# Patient Record
Sex: Male | Born: 1950
Health system: Southern US, Community
[De-identification: ages and names within clinical notes are randomized; demographics above are authoritative.]

## PROBLEM LIST (undated history)

## (undated) DIAGNOSIS — I10 Essential (primary) hypertension: Secondary | ICD-10-CM

## (undated) DIAGNOSIS — E7800 Pure hypercholesterolemia, unspecified: Secondary | ICD-10-CM

## (undated) DIAGNOSIS — I639 Cerebral infarction, unspecified: Secondary | ICD-10-CM

## (undated) HISTORY — PX: APPENDECTOMY: SHX54

---

## 1998-03-18 ENCOUNTER — Ambulatory Visit (HOSPITAL_COMMUNITY): Admission: RE | Admit: 1998-03-18 | Discharge: 1998-03-18 | Payer: Self-pay | Admitting: Family Medicine

## 2009-12-09 ENCOUNTER — Ambulatory Visit: Payer: Self-pay | Admitting: Physician Assistant

## 2009-12-09 DIAGNOSIS — I10 Essential (primary) hypertension: Secondary | ICD-10-CM

## 2009-12-12 ENCOUNTER — Encounter: Payer: Self-pay | Admitting: Physician Assistant

## 2009-12-13 ENCOUNTER — Encounter (INDEPENDENT_AMBULATORY_CARE_PROVIDER_SITE_OTHER): Payer: Self-pay | Admitting: *Deleted

## 2009-12-23 ENCOUNTER — Ambulatory Visit: Payer: Self-pay | Admitting: Physician Assistant

## 2009-12-23 DIAGNOSIS — F1010 Alcohol abuse, uncomplicated: Secondary | ICD-10-CM | POA: Insufficient documentation

## 2009-12-23 DIAGNOSIS — N529 Male erectile dysfunction, unspecified: Secondary | ICD-10-CM

## 2009-12-23 DIAGNOSIS — F17200 Nicotine dependence, unspecified, uncomplicated: Secondary | ICD-10-CM | POA: Insufficient documentation

## 2009-12-23 DIAGNOSIS — E785 Hyperlipidemia, unspecified: Secondary | ICD-10-CM

## 2009-12-26 ENCOUNTER — Encounter: Payer: Self-pay | Admitting: Physician Assistant

## 2010-01-03 ENCOUNTER — Inpatient Hospital Stay (HOSPITAL_COMMUNITY): Admission: EM | Admit: 2010-01-03 | Discharge: 2010-01-10 | Payer: Self-pay | Admitting: Emergency Medicine

## 2010-01-03 ENCOUNTER — Ambulatory Visit: Payer: Self-pay | Admitting: Cardiovascular Disease

## 2010-01-03 ENCOUNTER — Emergency Department (HOSPITAL_COMMUNITY): Admission: EM | Admit: 2010-01-03 | Discharge: 2010-01-03 | Payer: Self-pay | Admitting: Family Medicine

## 2010-01-04 ENCOUNTER — Encounter (INDEPENDENT_AMBULATORY_CARE_PROVIDER_SITE_OTHER): Payer: Self-pay | Admitting: Internal Medicine

## 2010-01-06 ENCOUNTER — Ambulatory Visit: Payer: Self-pay | Admitting: Physical Medicine & Rehabilitation

## 2010-01-10 ENCOUNTER — Inpatient Hospital Stay (HOSPITAL_COMMUNITY)
Admission: RE | Admit: 2010-01-10 | Discharge: 2010-01-16 | Payer: Self-pay | Admitting: Physical Medicine & Rehabilitation

## 2010-01-10 ENCOUNTER — Encounter: Payer: Self-pay | Admitting: Physician Assistant

## 2010-01-10 ENCOUNTER — Ambulatory Visit: Payer: Self-pay | Admitting: Physical Medicine & Rehabilitation

## 2010-01-23 ENCOUNTER — Encounter: Payer: Self-pay | Admitting: Physician Assistant

## 2010-01-31 ENCOUNTER — Ambulatory Visit: Payer: Self-pay | Admitting: Physician Assistant

## 2010-01-31 DIAGNOSIS — I6350 Cerebral infarction due to unspecified occlusion or stenosis of unspecified cerebral artery: Secondary | ICD-10-CM | POA: Insufficient documentation

## 2010-02-14 ENCOUNTER — Telehealth: Payer: Self-pay | Admitting: Physician Assistant

## 2010-02-22 ENCOUNTER — Encounter
Admission: RE | Admit: 2010-02-22 | Discharge: 2010-02-24 | Payer: Self-pay | Admitting: Physical Medicine & Rehabilitation

## 2010-02-24 ENCOUNTER — Ambulatory Visit: Payer: Self-pay | Admitting: Physical Medicine & Rehabilitation

## 2010-02-27 ENCOUNTER — Telehealth: Payer: Self-pay | Admitting: Physician Assistant

## 2010-03-01 ENCOUNTER — Telehealth: Payer: Self-pay | Admitting: Physician Assistant

## 2010-04-12 ENCOUNTER — Ambulatory Visit: Payer: Self-pay | Admitting: Physician Assistant

## 2010-04-12 ENCOUNTER — Telehealth: Payer: Self-pay | Admitting: Physician Assistant

## 2010-04-12 DIAGNOSIS — R269 Unspecified abnormalities of gait and mobility: Secondary | ICD-10-CM | POA: Insufficient documentation

## 2010-04-12 DIAGNOSIS — R5381 Other malaise: Secondary | ICD-10-CM

## 2010-04-12 DIAGNOSIS — R5383 Other fatigue: Secondary | ICD-10-CM

## 2010-04-12 LAB — CONVERTED CEMR LAB
Bilirubin Urine: NEGATIVE
Glucose, Urine, Semiquant: NEGATIVE
Specific Gravity, Urine: <1.005
pH: 5

## 2010-04-13 ENCOUNTER — Encounter: Payer: Self-pay | Admitting: Physician Assistant

## 2010-04-13 LAB — CONVERTED CEMR LAB
ALT: 15 units/L (ref 0–53)
Albumin: 4.4 g/dL (ref 3.5–5.2)
Alkaline Phosphatase: 74 units/L (ref 39–117)
Basophils Absolute: 0.1 10*3/uL (ref 0.0–0.1)
CO2: 23 meq/L (ref 19–32)
Glucose, Bld: 160 mg/dL — ABNORMAL HIGH (ref 70–99)
Hemoglobin: 14.6 g/dL (ref 13.0–17.0)
Lymphocytes Relative: 32 % (ref 12–46)
Monocytes Absolute: 0.6 10*3/uL (ref 0.1–1.0)
Monocytes Relative: 7 % (ref 3–12)
Neutro Abs: 5 10*3/uL (ref 1.7–7.7)
Potassium: 4 meq/L (ref 3.5–5.3)
RBC: 4.46 M/uL (ref 4.22–5.81)
Sodium: 136 meq/L (ref 135–145)
Total Protein: 8.3 g/dL (ref 6.0–8.3)

## 2010-04-18 ENCOUNTER — Ambulatory Visit: Payer: Self-pay | Admitting: Physician Assistant

## 2010-04-18 LAB — CONVERTED CEMR LAB: Blood Glucose, AC Bkfst: 101 mg/dL

## 2010-04-20 DIAGNOSIS — E739 Lactose intolerance, unspecified: Secondary | ICD-10-CM

## 2010-04-25 ENCOUNTER — Telehealth: Payer: Self-pay | Admitting: Physician Assistant

## 2010-04-25 ENCOUNTER — Encounter: Admission: RE | Admit: 2010-04-25 | Discharge: 2010-05-31 | Payer: Self-pay | Admitting: Physician Assistant

## 2010-04-25 ENCOUNTER — Ambulatory Visit: Payer: Self-pay | Admitting: Physician Assistant

## 2010-05-08 ENCOUNTER — Encounter: Payer: Self-pay | Admitting: Physician Assistant

## 2010-05-08 ENCOUNTER — Telehealth: Payer: Self-pay | Admitting: Physician Assistant

## 2010-05-22 ENCOUNTER — Encounter: Payer: Self-pay | Admitting: Physician Assistant

## 2010-05-31 ENCOUNTER — Encounter: Payer: Self-pay | Admitting: Physician Assistant

## 2010-05-31 ENCOUNTER — Encounter: Admission: RE | Admit: 2010-05-31 | Discharge: 2010-06-08 | Payer: Self-pay | Admitting: Physician Assistant

## 2010-06-12 ENCOUNTER — Telehealth: Payer: Self-pay | Admitting: Physician Assistant

## 2010-07-14 ENCOUNTER — Ambulatory Visit: Payer: Self-pay | Admitting: Physician Assistant

## 2010-07-14 DIAGNOSIS — G4700 Insomnia, unspecified: Secondary | ICD-10-CM

## 2010-07-14 DIAGNOSIS — L738 Other specified follicular disorders: Secondary | ICD-10-CM | POA: Insufficient documentation

## 2010-07-14 DIAGNOSIS — M25519 Pain in unspecified shoulder: Secondary | ICD-10-CM | POA: Insufficient documentation

## 2010-07-14 LAB — CONVERTED CEMR LAB
Albumin: 4.7 g/dL (ref 3.5–5.2)
CO2: 22 meq/L (ref 19–32)
Calcium: 9.6 mg/dL (ref 8.4–10.5)
Chloride: 103 meq/L (ref 96–112)
Cholesterol: 183 mg/dL (ref 0–200)
Glucose, Bld: 88 mg/dL (ref 70–99)
Potassium: 4.1 meq/L (ref 3.5–5.3)
Sodium: 138 meq/L (ref 135–145)
Total Bilirubin: 0.4 mg/dL (ref 0.3–1.2)
Total Protein: 7.9 g/dL (ref 6.0–8.3)
Triglycerides: 331 mg/dL — ABNORMAL HIGH (ref <150)

## 2010-07-19 ENCOUNTER — Ambulatory Visit: Payer: Self-pay | Admitting: Sports Medicine

## 2010-07-19 ENCOUNTER — Encounter (INDEPENDENT_AMBULATORY_CARE_PROVIDER_SITE_OTHER): Payer: Self-pay | Admitting: *Deleted

## 2010-07-19 DIAGNOSIS — M7500 Adhesive capsulitis of unspecified shoulder: Secondary | ICD-10-CM

## 2010-07-20 ENCOUNTER — Ambulatory Visit: Payer: Self-pay | Admitting: Physician Assistant

## 2010-07-23 ENCOUNTER — Telehealth: Payer: Self-pay | Admitting: Physician Assistant

## 2010-07-25 ENCOUNTER — Encounter: Payer: Self-pay | Admitting: Physician Assistant

## 2010-07-27 ENCOUNTER — Telehealth: Payer: Self-pay | Admitting: Physician Assistant

## 2010-07-30 ENCOUNTER — Emergency Department (HOSPITAL_COMMUNITY): Admission: EM | Admit: 2010-07-30 | Discharge: 2010-07-30 | Payer: Self-pay | Admitting: Emergency Medicine

## 2010-07-31 ENCOUNTER — Ambulatory Visit (HOSPITAL_COMMUNITY): Admission: RE | Admit: 2010-07-31 | Discharge: 2010-07-31 | Payer: Self-pay | Admitting: Gastroenterology

## 2010-08-04 DIAGNOSIS — K921 Melena: Secondary | ICD-10-CM

## 2010-08-08 ENCOUNTER — Ambulatory Visit: Payer: Self-pay | Admitting: Physician Assistant

## 2010-08-17 ENCOUNTER — Telehealth: Payer: Self-pay | Admitting: Physician Assistant

## 2010-08-23 ENCOUNTER — Emergency Department (HOSPITAL_COMMUNITY): Admission: EM | Admit: 2010-08-23 | Discharge: 2010-08-23 | Payer: Self-pay | Admitting: Emergency Medicine

## 2010-09-15 ENCOUNTER — Ambulatory Visit: Payer: Self-pay | Admitting: Physician Assistant

## 2010-09-16 ENCOUNTER — Encounter (INDEPENDENT_AMBULATORY_CARE_PROVIDER_SITE_OTHER): Payer: Self-pay | Admitting: Internal Medicine

## 2010-10-12 ENCOUNTER — Emergency Department (HOSPITAL_COMMUNITY)
Admission: EM | Admit: 2010-10-12 | Discharge: 2010-10-12 | Payer: Self-pay | Source: Home / Self Care | Admitting: Emergency Medicine

## 2010-10-18 ENCOUNTER — Ambulatory Visit: Payer: Self-pay | Admitting: Physician Assistant

## 2010-10-18 ENCOUNTER — Encounter (INDEPENDENT_AMBULATORY_CARE_PROVIDER_SITE_OTHER): Payer: Self-pay | Admitting: Nurse Practitioner

## 2010-10-20 ENCOUNTER — Encounter (INDEPENDENT_AMBULATORY_CARE_PROVIDER_SITE_OTHER): Payer: Self-pay | Admitting: Nurse Practitioner

## 2010-10-20 LAB — CONVERTED CEMR LAB
Albumin: 5.1 g/dL (ref 3.5–5.2)
HDL: 37 mg/dL — ABNORMAL LOW (ref >39)
LDL Cholesterol: 90 mg/dL (ref 0–99)
Total Bilirubin: 0.4 mg/dL (ref 0.3–1.2)
Total CHOL/HDL Ratio: 4.8
Total Protein: 8.5 g/dL — ABNORMAL HIGH (ref 6.0–8.3)
Triglycerides: 259 mg/dL — ABNORMAL HIGH (ref <150)
VLDL: 52 mg/dL — ABNORMAL HIGH (ref 0–40)

## 2010-10-30 ENCOUNTER — Ambulatory Visit: Payer: Self-pay | Admitting: Nurse Practitioner

## 2010-10-30 LAB — CONVERTED CEMR LAB
Cholesterol, target level: 200 mg/dL
HDL goal, serum: 40 mg/dL

## 2010-12-05 ENCOUNTER — Ambulatory Visit
Admission: RE | Admit: 2010-12-05 | Discharge: 2010-12-05 | Payer: Self-pay | Source: Home / Self Care | Attending: Surgery | Admitting: Surgery

## 2010-12-05 ENCOUNTER — Encounter (INDEPENDENT_AMBULATORY_CARE_PROVIDER_SITE_OTHER): Payer: Self-pay | Admitting: Nurse Practitioner

## 2010-12-05 LAB — CONVERTED CEMR LAB: Cholesterol: 133 mg/dL (ref 0–200)

## 2010-12-07 ENCOUNTER — Encounter (INDEPENDENT_AMBULATORY_CARE_PROVIDER_SITE_OTHER): Payer: Self-pay | Admitting: Nurse Practitioner

## 2010-12-31 LAB — CONVERTED CEMR LAB
AST: 19 units/L (ref 0–37)
Albumin: 4.9 g/dL (ref 3.5–5.2)
Alkaline Phosphatase: 70 units/L (ref 39–117)
Basophils Absolute: 0 10*3/uL (ref 0.0–0.1)
Benzodiazepines.: NEGATIVE
Chloride: 103 meq/L (ref 96–112)
Glucose, Bld: 90 mg/dL (ref 70–99)
Hemoglobin: 16 g/dL (ref 13.0–17.0)
LDL Cholesterol: 124 mg/dL — ABNORMAL HIGH (ref 0–99)
Lymphocytes Relative: 33 % (ref 12–46)
Lymphs Abs: 2.5 10*3/uL (ref 0.7–4.0)
Methadone: NEGATIVE
Monocytes Absolute: 0.6 10*3/uL (ref 0.1–1.0)
Neutro Abs: 4.2 10*3/uL (ref 1.7–7.7)
Potassium: 4.4 meq/L (ref 3.5–5.3)
Propoxyphene: NEGATIVE
RBC: 4.7 M/uL (ref 4.22–5.81)
RDW: 14.3 % (ref 11.5–15.5)
Sodium: 141 meq/L (ref 135–145)
TSH: 2.393 microintl units/mL (ref 0.350–4.500)
Total Protein: 8.4 g/dL — ABNORMAL HIGH (ref 6.0–8.3)
Triglycerides: 301 mg/dL — ABNORMAL HIGH (ref <150)
WBC: 7.5 10*3/uL (ref 4.0–10.5)

## 2011-01-02 NOTE — Letter (Signed)
Summary: *HSN Results Follow up  [facility]  [address] [place], [place] [address]   Phone: [phone]  Fax: [phone]      12/13/2009   LARY ECKARDT [address] [place], [place]  [address]   Dear  [name],                            ____S.Drinkard,FNP   ____D. Gore,FNP       ____B. McPherson,MD   ____V. Rankins,MD    ____E. Mulberry,MD    ____N. Daphine Deutscher, FNP  ____D. Reche Dixon, MD    ____K. Philipp Deputy, MD    ____Other     This letter is to inform you that your recent test(s):  _______Pap Smear    ___X____Lab Test     _______X-ray    _______ is within acceptable limits  ___X____ requires a medication change  _______ requires a follow-up lab visit  _______ requires a follow-up visit with your provider   Comments: We have been trying to reach you concerning changing your medication.  Please give the office a call at your earliest convenience.       _________________________________________________________ If you have any questions, please contact our office                     Sincerely,  [place] Shannon [facility]

## 2011-01-02 NOTE — Assessment & Plan Note (Signed)
Summary: 2 week f/u and test results /tmm   Vital Signs:  Patient profile:   60 year old male Height:      69 inches Weight:      180 pounds BMI:     26.68 Temp:     98.2 degrees F oral Pulse rate:   88 / minute Pulse rhythm:   regular Resp:     18 per minute BP sitting:   168 / 89  (left arm) Cuff size:   regular  Vitals Entered By: [place] Shannon (December 23, 2009 11:03 AM) CC: two week f/u...Marland KitchenMarland Kitchen pt says he thinks that the bp med still not working... pt is here to talk about his cholestrol..., Hypertension Management Is Patient Diabetic? No Pain Assessment Patient in pain? no       Does patient need assistance? Functional Status Self care Ambulation Normal   CC:  two week f/u...Marland KitchenMarland Kitchen pt says he thinks that the bp med still not working... pt is here to talk about his cholestrol... and Hypertension Management.  History of Present Illness: Here for f/u. No GI appt yet.  No more bleeding. Taking BP meds. Still drinking about 2-3 airplane bottles of liquor a day.  Has gone through AA in past.   Hypertension History:      He denies headache, chest pain, palpitations, dyspnea with exertion, and syncope.  He notes no problems with any antihypertensive medication side effects.        Positive major cardiovascular risk factors include male age 78 years old or older, hyperlipidemia, hypertension, and current tobacco user.     Habits & Providers  Exercise-Depression-Behavior     Have you felt down or hopeless? no     Have you felt little pleasure in things? no  Current Medications (verified): 1)  Amlodipine Besylate 5 Mg Tabs (Amlodipine Besylate) .... Take 1 Tablet By Mouth Once A Day  Allergies (verified): No Known Drug Allergies  Physical Exam  General:  alert, well-developed, and well-nourished.   Head:  normocephalic and atraumatic.   Neck:  supple.   Lungs:  normal breath sounds, no crackles, and no wheezes.   Heart:  normal rate and regular rhythm.   Neurologic:   alert & oriented X3 and cranial nerves II-XII intact.   Psych:  normally interactive.     Impression & Recommendations:  Problem # 1:  ESSENTIAL HYPERTENSION, BENIGN (ICD-401.1) add lisinopril hct and recheck bmet in 2 weeks  His updated medication list for this problem includes:    Amlodipine Besylate 5 Mg Tabs (Amlodipine besylate) .Marland Kitchen... Take 1 tablet by mouth once a day    Lisinopril-hydrochlorothiazide 20-12.5 Mg Tabs (Lisinopril-hydrochlorothiazide) .Marland Kitchen... Take 1 tablet by mouth once a day for blood pressure  Problem # 2:  HYPERLIPIDEMIA (ICD-272.4) rec he start on meds check L/L in 8-12 weeks  His updated medication list for this problem includes:    Pravastatin Sodium 20 Mg Tabs (Pravastatin sodium) .Marland Kitchen... Take 1 tab by mouth at bedtime for cholesterol  Problem # 3:  BLOOD IN STOOL (ICD-578.1) GI referral pending  Problem # 4:  ALCOHOL ABUSE (ICD-305.00) recommend he see LCSW or sub abuse counselor patient declines will continue to try to encourage him  Problem # 5:  TOBACCO ABUSE (ICD-305.1)  patient wants to try chantix warned of side effects and need to d/c if they occur  His updated medication list for this problem includes:    Chantix Starting Month Pak 0.5 Mg X 11 & 1 Mg X  42 Tabs (Varenicline tartrate) .Marland Kitchen... As directed    Chantix Continuing Month Pak 1 Mg Tabs (Varenicline tartrate) .Marland Kitchen... As directed  Problem # 6:  ERECTILE DYSFUNCTION, ORGANIC (ICD-607.84) patient reports problems with keeping erections will address BP first if continues to be a problem, can discuss viagra  Complete Medication List: 1)  Amlodipine Besylate 5 Mg Tabs (Amlodipine besylate) .... Take 1 tablet by mouth once a day 2)  Lisinopril-hydrochlorothiazide 20-12.5 Mg Tabs (Lisinopril-hydrochlorothiazide) .... Take 1 tablet by mouth once a day for blood pressure 3)  Pravastatin Sodium 20 Mg Tabs (Pravastatin sodium) .... Take 1 tab by mouth at bedtime for cholesterol 4)  Chantix  Starting Month Pak 0.5 Mg X 11 & 1 Mg X 42 Tabs (Varenicline tartrate) .... As directed 5)  Chantix Continuing Month Pak 1 Mg Tabs (Varenicline tartrate) .... As directed  Hypertension Assessment/Plan:      The patient's hypertensive risk group is category B: At least one risk factor (excluding diabetes) with no target organ damage.  His calculated 10 year risk of coronary heart disease is 27 %.  Today's blood pressure is 168/89.  His blood pressure goal is < 140/90.  Patient Instructions: 1)  Tobacco is very bad for your health and your loved ones ! You should stop smoking !  2)  Stop smoking tips: Choose a quit date. Cut down before the quit date. Decide what you will do as a substitute when you feel the urge to smoke(gum, toothpick, exercise).  3)  It is not healthy for men to drink more then 2-3 drinks per day or for women to drink more then 1-2 drinks per day.  4)  Return in 2 weeks for BP check and BMET with the nurse.  Dx 401.1. 5)  Schedule follow up with Larah Kuntzman in 6 weeks for blood pressure. Prescriptions: CHANTIX CONTINUING MONTH PAK 1 MG TABS (VARENICLINE TARTRATE) as directed  #1 pack x 2   Entered and Authorized by:   Tereso Newcomer PA-C   Signed by:   Tereso Newcomer PA-C on 12/23/2009   Method used:   Faxed to ...       [facility] (retail)       [address] [place], [place]  [address]       Ph: [phone]       Fax: [phone]   RxID:   [id number] CHANTIX STARTING MONTH PAK 0.5 MG X 11 & 1 MG X 42 TABS (VARENICLINE TARTRATE) as directed  #1 pack x 0   Entered and Authorized by:   Tereso Newcomer PA-C   Signed by:   Tereso Newcomer PA-C on 12/23/2009   Method used:   Faxed to ...       [facility] (retail)       [address] [place], [place]  [address]       Ph: [phone]       Fax: [phone]   RxID:   [id number] PRAVASTATIN SODIUM 20 MG TABS (PRAVASTATIN SODIUM) Take  1 tab by mouth at bedtime for cholesterol  #30 x 3   Entered and Authorized by:   Tereso Newcomer PA-C   Signed by:   Tereso Newcomer PA-C on 12/23/2009   Method used:   Print then Give to Patient   RxID:   [id number] LISINOPRIL-HYDROCHLOROTHIAZIDE 20-12.5 MG TABS (LISINOPRIL-HYDROCHLOROTHIAZIDE) Take 1 tablet by mouth once a day  for blood pressure  #30 x 5   Entered and Authorized by:   Tereso Newcomer PA-C   Signed by:   Tereso Newcomer PA-C on 12/23/2009   Method used:   Print then Give to Patient   RxID:   [id number]   Appended Document: 2 week f/u and test results /tmm  Laboratory Results    Stool - Occult Blood Hemmoccult #1: negative Date: 12/23/2009 Hemoccult #2: negative Date: 12/23/2009 Hemoccult #3: negative Date: 12/23/2009

## 2011-01-02 NOTE — Progress Notes (Signed)
Summary: GI update  Phone Note From Other Clinic   Caller: Referral Coordinator Summary of Call: Spoke to Pt to inform him that [facility] have been trying to contact him for an appt.Pt informed me that he is no longer having the bleeding from his rectum and does not wish to go to [facility] Initial call taken by: Candi Leash,  February 27, 2010 11:41 AM  Follow-up for Phone Call        He still needs to go to GI to get a colonoscopy. It doesn't matter if the bleeding stopped or not. I left a message for him to call back. I can talk to him when he calls. Follow-up by: Tereso Newcomer PA-C,  February 27, 2010 1:11 PM  Additional Follow-up for Phone Call Additional follow up Details #1::        Spoke with patient and explained importance of getting colo. done.  Please explain to him what the cost will be. If he can afford to see [facility] or [facility], he will. He is applying for disability.  If cannot afford [facility] or [facility], notify me and we will try to wait until he gets medicare. Additional Follow-up by: Tereso Newcomer PA-C,  March 01, 2010 2:38 PM

## 2011-01-02 NOTE — Progress Notes (Signed)
Summary: Add fish oil for trig's  Phone Note Outgoing Call   Summary of Call: Triglycerides are too high. Start fish oil 1000 mg take 2 caps daily. He can get over the counter. He should have repeat labs in 3 mos: FLP and LFTs Schedule appt with Susie for help with reducing triglycerides.  Initial call taken by: Brynda Rim,  July 23, 2010 9:46 PM  Follow-up for Phone Call        Left message on answering machine for pt to call back.Marland KitchenMarland KitchenArmenia Shannon  July 24, 2010 11:17 AM  Left message on answer machine for pt. to return call. Gaylyn Cheers RN  July 25, 2010 11:25 AM   Advised of lab results and provider's instructions.  Appts. made for nutritionist 08/08/10 and labwork 10/25/10.    Follow-up by: Dutch Quint RN,  July 25, 2010 3:25 PM    New/Updated Medications: FISH OIL 1000 MG CAPS (OMEGA-3 FATTY ACIDS) Take 2 caps once daily    Impression & Recommendations:  Problem # 1:  HYPERLIPIDEMIA (ICD-272.4) LDL ok Trigs too high add fish oil  His updated medication list for this problem includes:    Simvastatin 20 Mg Tabs (Simvastatin) .Marland Kitchen... Take 1 tab by mouth at bedtime for cholesterol  Complete Medication List: 1)  Lisinopril-hydrochlorothiazide 20-12.5 Mg Tabs (Lisinopril-hydrochlorothiazide) .... Take 1 tablet by mouth once a day for blood pressure 2)  Simvastatin 20 Mg Tabs (Simvastatin) .... Take 1 tab by mouth at bedtime for cholesterol 3)  Ecotrin 325 Mg Tbec (Aspirin) .... Take 1 tablet by mouth once a day 4)  Miralax Powd (Polyethylene glycol 3350) .... Dissolve one capful in water and drink one glass a day as needed for constipation 5)  Trazodone Hcl 50 Mg Tabs (Trazodone hcl) .... Take 1 tab by mouth at bedtime as needed for sleep 6)  Naprosyn 500 Mg Tabs (Naproxen) .... Take 1 tablet by mouth two times a day with food as needed for pain 7)  Fish Oil 1000 Mg Caps (Omega-3 fatty acids) .... Take 2 caps once daily

## 2011-01-02 NOTE — Letter (Signed)
Summary: *HSN Results Follow up  [facility]  [address] [place], [place] [address]   Phone: [phone]  Fax: [phone]      12/12/2009   James Riley [address] [place], [place]  [address]   Dear  [name],                            ____S.Drinkard,FNP   ____D. Gore,FNP       ____B. McPherson,MD   ____V. Rankins,MD    ____E. Mulberry,MD    ____N. Daphine Deutscher, FNP  ____D. Reche Dixon, MD    ____K. Philipp Deputy, MD    __x__S. Alben Spittle, PA-C     This letter is to inform you that your recent test(s):  _______Pap Smear    ___x____Lab Test     _______X-ray    _______ is within acceptable limits  ___x____ requires a medication change  _______ requires a follow-up lab visit  _______ requires a follow-up visit with your provider   Comments:  All labs normal except your cholesterol is high.  You need to cut back on alcohol.  I want to start you on a medication for cholesterol.  Please contact our office so we can start the medication.  Riley the other letter regarding cholesterol.       _________________________________________________________ If you have any questions, please contact our office                     Sincerely,  Tereso Newcomer PA-C [facility]

## 2011-01-02 NOTE — Progress Notes (Signed)
  Phone Note Outgoing Call   Summary of Call: Schedule f/u appt in May 2011 Initial call taken by: Tereso Newcomer PA-C,  March 01, 2010 3:19 PM  Follow-up for Phone Call        appt is scheduled... Follow-up by: [place] Shannon,  March 06, 2010 6:05 PM

## 2011-01-02 NOTE — Letter (Signed)
Summary: NUTRITIONIST SUMMARY/SUSIE  NUTRITIONIST SUMMARY/SUSIE   Imported By: Arta Bruce 08/21/2010 14:23:28  _____________________________________________________________________  External Attachment:    Type:   Image     Comment:   External Document

## 2011-01-02 NOTE — Letter (Signed)
Summary: pt information sheet  pt information sheet   Imported By: Arta Bruce 02/16/2010 12:07:35  _____________________________________________________________________  External Attachment:    Type:   Image     Comment:   External Document

## 2011-01-02 NOTE — Progress Notes (Signed)
Summary: Office Visit//DEPRESSION SCREENIG  Office Visit//DEPRESSION SCREENIG   Imported By: Arta Bruce 01/23/2010 16:25:30  _____________________________________________________________________  External Attachment:    Type:   Image     Comment:   External Document

## 2011-01-02 NOTE — Assessment & Plan Note (Signed)
Summary: Scalp Folliculitis; HTN   Vital Signs:  Patient profile:   60 year old male Height:      69 inches Weight:      191 pounds BMI:     28.31 Temp:     98.8 degrees F oral Pulse rate:   88 / minute Pulse rhythm:   regular Resp:     18 per minute BP sitting:   120 / 78  (left arm)  Vitals Entered By: [place] Shannon (September 15, 2010 8:35 AM) CC: two month f/u..., Hypertension Management Is Patient Diabetic? No Pain Assessment Patient in pain? no       Does patient need assistance? Functional Status Self care Ambulation Normal   Primary Care Provider:  Tereso Newcomer PA-C  CC:  two month f/u... and Hypertension Management.  History of Present Illness: Here for f/u.  Right shoulder feels better.  Had steroid injection.  Supposed to go to PT and f/u at [facility].  Does not have appt.  Had to cancel PT appt due to colo appt.  Scalp:  Took doxy x 7 days.  States no better.  Went to UC.  Got ketoconazole shampoo.  States a little better.  Pruritic.  Drains pus.  Hurts.  Hypertension History:      He denies headache, chest pain, dyspnea with exertion, and syncope.  He notes no problems with any antihypertensive medication side effects.        Positive major cardiovascular risk factors include male age 53 years old or older, hyperlipidemia, hypertension, and current tobacco user.        Positive history for target organ damage include prior stroke (or TIA).     Current Medications (verified): 1)  Lisinopril-Hydrochlorothiazide 20-12.5 Mg Tabs (Lisinopril-Hydrochlorothiazide) .... Take 1 Tablet By Mouth Once A Day For Blood Pressure 2)  Simvastatin 20 Mg Tabs (Simvastatin) .... Take 1 Tab By Mouth At Bedtime For Cholesterol 3)  Ecotrin 325 Mg Tbec (Aspirin) .... Take 1 Tablet By Mouth Once A Day 4)  Miralax  Powd (Polyethylene Glycol 3350) .... Dissolve One Capful in Water and Drink One Glass A Day As Needed For Constipation 5)  Ambien 5 Mg Tabs (Zolpidem Tartrate) .... Take 1  Tab By Mouth At Bedtime As Needed For Sleep 6)  Naprosyn 500 Mg Tabs (Naproxen) .... Take 1 Tablet By Mouth Two Times A Day With Food As Needed For Pain 7)  Fish Oil 1000 Mg Caps (Omega-3 Fatty Acids) .... Take 2 Caps Once Daily 8)  Ketoconazole 2 % Sham (Ketoconazole) .... Apply To Scalp Every Day  Allergies (verified): No Known Drug Allergies  Past History:  Past Medical History: h/o ? prostatitis Glucose Intolerance   a.  to see nutritionist Colo 07/2010:  polyps (path: tubular adenomas; no high grade dysplasia); int and ext hem's (likely source of bleeding); o/w normal . . . repeat due 2014  Physical Exam  General:  alert, well-developed, and well-nourished.   Head:  scattered nodules about scalp  tender to the touch purulent drainage Neck:  supple.   Lungs:  normal breath sounds.   Heart:  normal rate and regular rhythm.   Extremities:  no edema Neurologic:  alert & oriented X3 and cranial nerves II-XII intact.   Psych:  good eye contact.     Impression & Recommendations:  Problem # 1:  FOLLICULITIS (ICD-704.8)  on scalp ? fungal Dr. Delrae Alfred also observed sent for bacterial and fungal cx start lamisil for 6 weeks warned patient to  avoid alcohol f/u LFTs in 4 weeks and OV in 6 weeks to recheck  Orders: T-Culture, Wound ([id number]) T-Culture, Fungus w/o Smear ([id number])  Problem # 2:  ESSENTIAL HYPERTENSION, BENIGN (ICD-401.1) Assessment: Improved continue current meds  His updated medication list for this problem includes:    Lisinopril-hydrochlorothiazide 20-12.5 Mg Tabs (Lisinopril-hydrochlorothiazide) .Marland Kitchen... Take 1 tablet by mouth once a day for blood pressure  Problem # 3:  HYPERLIPIDEMIA (ICD-272.4) check FLP in Nov  His updated medication list for this problem includes:    Simvastatin 20 Mg Tabs (Simvastatin) .Marland Kitchen... Take 1 tab by mouth at bedtime for cholesterol  Problem # 4:  ADHESIVE CAPSULITIS, RIGHT (ICD-726.0) Assessment:  Improved f/u with [facility] and PT  Complete Medication List: 1)  Lisinopril-hydrochlorothiazide 20-12.5 Mg Tabs (Lisinopril-hydrochlorothiazide) .... Take 1 tablet by mouth once a day for blood pressure 2)  Simvastatin 20 Mg Tabs (Simvastatin) .... Take 1 tab by mouth at bedtime for cholesterol 3)  Ecotrin 325 Mg Tbec (Aspirin) .... Take 1 tablet by mouth once a day 4)  Miralax Powd (Polyethylene glycol 3350) .... Dissolve one capful in water and drink one glass a day as needed for constipation 5)  Ambien 5 Mg Tabs (Zolpidem tartrate) .... Take 1 tab by mouth at bedtime as needed for sleep 6)  Naprosyn 500 Mg Tabs (Naproxen) .... Take 1 tablet by mouth two times a day with food as needed for pain 7)  Fish Oil 1000 Mg Caps (Omega-3 fatty acids) .... Take 2 caps once daily 8)  Ketoconazole 2 % Sham (Ketoconazole) .... Apply to scalp every day 9)  Terbinafine Hcl 250 Mg Tabs (Terbinafine hcl) .... Take one by mouth once daily for 6 weeks  Other Orders: Flu Vaccine 53yrs + (29562) Admin 1st Vaccine (13086)  Hypertension Assessment/Plan:      The patient's hypertensive risk group is category C: Target organ damage and/or diabetes.  His calculated 10 year risk of coronary heart disease is 14 %.  Today's blood pressure is 120/78.  His blood pressure goal is < 140/90.  Patient Instructions: 1)  [place], Please call [facility] at [facility].  He is to have an appt with physical therapy (had to cancel due to colonoscopy) and a follow up with the doctor (Dr. Nedra Hai).  Please arrange. 2)  Take Terbinafine once daily until all gone. 3)  Keep using the shampoo as directed. 4)  Do not wear any hats. 5)  You should avoid alcohol.  You are on a cholesterol medicine.  And, the medicine for your scalp works in the liver.  Drinking alcohol can be dangerous with these medicines. 6)  Lab visit in 4 weeks:  LFTs and FLP (272.4; high risk med). 7)  Schedule office visit with Dr. Delrae Alfred in 6 weeks to recheck  scalp. Prescriptions: TERBINAFINE HCL 250 MG TABS (TERBINAFINE HCL) Take one by mouth once daily for 6 weeks  #42 x 0   Entered and Authorized by:   Tereso Newcomer PA-C   Signed by:   Tereso Newcomer PA-C on 09/15/2010   Method used:   Print then Give to Patient   RxID:   [id number]    Immunizations Administered:  Influenza Vaccine # 1:    Vaccine Type: Fluvax 3+    Site: left deltoid    Mfr: [employer]    Dose: 0.5 ml    Route: IM    Given by: Dutch Quint RN    Exp. Date: 06/02/2011    Lot #: [id number]  VIS given: 06/27/10 version given September 15, 2010.  Flu Vaccine Consent Questions:    Do you have a history of severe allergic reactions to this vaccine? no    Any prior history of allergic reactions to egg and/or gelatin? no    Do you have a sensitivity to the preservative Thimersol? no    Do you have a past history of Guillan-Barre Syndrome? no    Do you currently have an acute febrile illness? no    Have you ever had a severe reaction to latex? no    Vaccine information given and explained to patient? yes

## 2011-01-02 NOTE — Progress Notes (Signed)
Summary: MED REFILL  Phone Note Call from Patient Call back at [phone] Message from:  Patient  Refills Requested: Medication #1:  TRAZODONE HCL 50 MG TABS Take 1 tab by mouth at bedtime as needed for sleep [facility] [address]   Initial call taken by: Oscar La,  August 17, 2010 8:47 AM Caller: Other Relative Reason for Call: Refill Medication Summary of Call: PT IS TAKING TRAZODONE AND IT IS NOT WORKING AND HE WANTS TO KNOW IF HE CAN GET SOMETHING ELSE FOR THE SLEEP [facility] [address] Initial call taken by: Oscar La,  August 17, 2010 8:51 AM  Follow-up for Phone Call        Sent to S. Caelen Reierson.  Dutch Quint RN  August 17, 2010 10:58 AM  He can try Ambien. Rx for #15 per month only. DO NOT use every night.  Just as needed. No alcohol.  Drinking alcohol with this can be very dangerous. Rx in basket to fax. Tereso Newcomer PA-C  August 17, 2010 1:36 PM  Follow-up by: Tereso Newcomer PA-C,  August 17, 2010 1:34 PM  Additional Follow-up for Phone Call Additional follow up Details #1::        Notified pt. of refill and provider's instructions -- verbalized understanding.  Rx faxed to [facility] [address]  Dutch Quint RN  August 17, 2010 2:27 PM     New/Updated Medications: AMBIEN 5 MG TABS (ZOLPIDEM TARTRATE) Take 1 tab by mouth at bedtime as needed for sleep Prescriptions: AMBIEN 5 MG TABS (ZOLPIDEM TARTRATE) Take 1 tab by mouth at bedtime as needed for sleep  #15 per month x 1   Entered and Authorized by:   Tereso Newcomer PA-C   Signed by:   Tereso Newcomer PA-C on 08/17/2010   Method used:   Printed then faxed to ...       [facility] [address] [phone]* (retail)       [address]       [place], [place]  [address]       Ph: [phone]       Fax: [phone]   RxID:   [id number]

## 2011-01-02 NOTE — Letter (Signed)
Summary: *Consult Note  [facility]  [address]   [place], [place] [address]   Phone: [phone]  Fax: [phone]    Re:    James Riley DOB:    [birth year]   Dear: Hyman Hopes   Thank you for requesting that we see the above patient for consultation.  A copy of the detailed office note will be sent under separate cover, for your review.  Evaluation today is consistent with: right frozen shoulder   Our recommendation is for: glenohumeral cortisone injection performed today, referral to PT for ROM   New Orders include: PT   New Medications started today include:    After today's visit, the patients current medications include: 1)  LISINOPRIL-HYDROCHLOROTHIAZIDE 20-12.5 MG TABS (LISINOPRIL-HYDROCHLOROTHIAZIDE) Take 1 tablet by mouth once a day for blood pressure 2)  SIMVASTATIN 20 MG TABS (SIMVASTATIN) Take 1 tab by mouth at bedtime for cholesterol 3)  ECOTRIN 325 MG TBEC (ASPIRIN) Take 1 tablet by mouth once a day 4)  MIRALAX  POWD (POLYETHYLENE GLYCOL 3350) Dissolve one capful in water and drink one glass a day as needed for constipation 5)  TRAZODONE HCL 50 MG TABS (TRAZODONE HCL) Take 1 tab by mouth at bedtime as needed for sleep 6)  NAPROSYN 500 MG TABS (NAPROXEN) Take 1 tablet by mouth two times a day with food as needed for pain 7)  DOXYCYCLINE HYCLATE 100 MG TABS (DOXYCYCLINE HYCLATE) Take 1 tablet by mouth two times a day unti all gone   Thank you for this consultation.  If you have any further questions regarding the care of this patient, please do not hesitate to contact me @ [phone]  Thank you for this opportunity to look after your patient.  Sincerely,   Corbin Ade MD Sports Medicine Fellow

## 2011-01-02 NOTE — Assessment & Plan Note (Signed)
Summary: F/u Folliculitis   Vital Signs:  Patient profile:   60 year old male Weight:      187.6 pounds BMI:     27.80 Temp:     97.1 degrees F oral Pulse rate:   88 / minute Pulse rhythm:   regular Resp:     20 per minute BP sitting:   144 / 80  (left arm) Cuff size:   regular  Vitals Entered By: Levon Hedger (October 30, 2010 10:30 AM)  Nutrition Counseling: Patient's BMI is greater than 25 and therefore counseled on weight management options. CC: follow-up visit on scalp, Hypertension Management, Lipid Management Is Patient Diabetic? No Pain Assessment Patient in pain? no       Does patient need assistance? Functional Status Self care Ambulation Normal   Primary Care Provider:  Tereso Newcomer PA-C  CC:  follow-up visit on scalp, Hypertension Management, and Lipid Management.  History of Present Illness:  Pt into the office to f/u on scalp.  Pt presents with all his medications and he has taken morning doses already  Right shoulder - :"Still achy at times but it is doing much better" Pt did not go to physical therapy as ordered due to conflick with scheduling at the same time as colonscopy.  Pt is taking naprosyn as needed for pain which helps.  Folliculiits - pt was started on terbinafine during last visit and he has taking full dose as ordered. Pt has also been using medicated shampoo as ordered. Pt was previously taking doxycycline at onset of symptoms. Brother cuts the pt's hair and they share clippers Pt wears a hats all the time. Pt advised to wash the hats to prevent reinfection  Hypertension History:      He denies headache, chest pain, and palpitations.  He notes no problems with any antihypertensive medication side effects.  "When I smoke my blood pressure goes up".        Positive major cardiovascular risk factors include male age 43 years old or older, hyperlipidemia, hypertension, and current tobacco user.        Positive history for target organ  damage include prior stroke (or TIA).    Lipid Management History:      Positive NCEP/ATP III risk factors include male age 103 years old or older, HDL cholesterol less than 40, current tobacco user, hypertension, and prior stroke (or TIA).        The patient states that he does not know about the "Therapeutic Lifestyle Change" diet.  The patient does not know about adjunctive measures for cholesterol lowering.  Comments include: pt has started zetia as ordered on last visit.he is also taking simvastatin nightly.      Habits & Providers  Alcohol-Tobacco-Diet     Alcohol drinks/day: >5     Feels need to cut down: yes     Feels annoyed by complaints: no     Feels guilty re: drinking: no     Needs 'eye opener' in am: no     Tobacco Status: current     Tobacco Counseling: to quit use of tobacco products     Cigarette Packs/Day: 1.0     Year Started: 1969     Pack years: 48  Exercise-Depression-Behavior     Does Patient Exercise: no     STD Risk: never     Drug Use: past  Medications Prior to Update: 1)  Lisinopril-Hydrochlorothiazide 20-12.5 Mg Tabs (Lisinopril-Hydrochlorothiazide) .... Take 1 Tablet By Mouth Once A Day  For Blood Pressure 2)  Simvastatin 20 Mg Tabs (Simvastatin) .... Take 1 Tab By Mouth At Bedtime For Cholesterol 3)  Ecotrin 325 Mg Tbec (Aspirin) .... Take 1 Tablet By Mouth Once A Day 4)  Miralax  Powd (Polyethylene Glycol 3350) .... Dissolve One Capful in Water and Drink One Glass A Day As Needed For Constipation 5)  Ambien 5 Mg Tabs (Zolpidem Tartrate) .... Take 1 Tab By Mouth At Bedtime As Needed For Sleep 6)  Naprosyn 500 Mg Tabs (Naproxen) .... Take 1 Tablet By Mouth Two Times A Day With Food As Needed For Pain 7)  Fish Oil 1000 Mg Caps (Omega-3 Fatty Acids) .... Take 2 Caps Once Daily 8)  Ketoconazole 2 % Sham (Ketoconazole) .... Apply To Scalp Every Day 9)  Zetia 10 Mg Tabs (Ezetimibe) .... One Tablet By Mouth Daily For Cholesterol  Current Medications  (verified): 1)  Lisinopril-Hydrochlorothiazide 20-12.5 Mg Tabs (Lisinopril-Hydrochlorothiazide) .... Take 1 Tablet By Mouth Once A Day For Blood Pressure 2)  Simvastatin 20 Mg Tabs (Simvastatin) .... Take 1 Tab By Mouth At Bedtime For Cholesterol 3)  Ecotrin 325 Mg Tbec (Aspirin) .... Take 1 Tablet By Mouth Once A Day 4)  Miralax  Powd (Polyethylene Glycol 3350) .... Dissolve One Capful in Water and Drink One Glass A Day As Needed For Constipation 5)  Ambien 5 Mg Tabs (Zolpidem Tartrate) .... Take 1 Tab By Mouth At Bedtime As Needed For Sleep 6)  Naprosyn 500 Mg Tabs (Naproxen) .... Take 1 Tablet By Mouth Two Times A Day With Food As Needed For Pain 7)  Fish Oil 1000 Mg Caps (Omega-3 Fatty Acids) .... Take 2 Caps Once Daily 8)  Ketoconazole 2 % Sham (Ketoconazole) .... Apply To Scalp Every Day 9)  Zetia 10 Mg Tabs (Ezetimibe) .... One Tablet By Mouth Daily For Cholesterol  Allergies (verified): No Known Drug Allergies  Social History: Does Patient Exercise:  no  Review of Systems General:  Denies fever. CV:  Denies chest pain or discomfort. Resp:  Denies cough. GI:  Denies abdominal pain, nausea, and vomiting. GU:  Complains of erectile dysfunction. MS:  Complains of joint pain; right shoulder - achy at times but is improving.  Physical Exam  General:  alert.   Head:  normocephalic.   male pattern baldness - improved but one area in center of head with crust Lungs:  normal breath sounds.   Heart:  normal rate and regular rhythm.   Abdomen:  normal bowel sounds.   Msk:  up to the exam table   Impression & Recommendations:  Problem # 1:  FOLLICULITIS (ICD-704.8) culture  negative for fungus no need to restart lamisil advised pt to wash all his caps also he will need to monitor clippers and be sure they are cleaned between use  Problem # 2:  ESSENTIAL HYPERTENSION, BENIGN (ICD-401.1) BP slightly elevated will need to monitor pt may need medications but smoking has a lot to  do with BP His updated medication list for this problem includes:    Lisinopril-hydrochlorothiazide 20-12.5 Mg Tabs (Lisinopril-hydrochlorothiazide) .Marland Kitchen... Take 1 tablet by mouth once a day for blood pressure  Problem # 3:  HYPERLIPIDEMIA (ICD-272.4) Will recheck in 6 week after starting zetia His updated medication list for this problem includes:    Simvastatin 20 Mg Tabs (Simvastatin) .Marland Kitchen... Take 1 tab by mouth at bedtime for cholesterol    Zetia 10 Mg Tabs (Ezetimibe) ..... One tablet by mouth daily for cholesterol  Problem # 4:  SHOULDER PAIN, RIGHT (ICD-719.41) stable pt did not go to physical therapy as ordered.  will monitor His updated medication list for this problem includes:    Ecotrin 325 Mg Tbec (Aspirin) .Marland Kitchen... Take 1 tablet by mouth once a day    Naprosyn 500 Mg Tabs (Naproxen) .Marland Kitchen... Take 1 tablet by mouth two times a day with food as needed for pain  Problem # 5:  ERECTILE DYSFUNCTION, ORGANIC (ICD-607.84)  advised pt to get HTN and lipids controlled  will not start meds until done  Problem # 6:  TOBACCO ABUSE (ICD-305.1) advise cessation  Complete Medication List: 1)  Lisinopril-hydrochlorothiazide 20-12.5 Mg Tabs (Lisinopril-hydrochlorothiazide) .... Take 1 tablet by mouth once a day for blood pressure 2)  Simvastatin 20 Mg Tabs (Simvastatin) .... Take 1 tab by mouth at bedtime for cholesterol 3)  Ecotrin 325 Mg Tbec (Aspirin) .... Take 1 tablet by mouth once a day 4)  Miralax Powd (Polyethylene glycol 3350) .... Dissolve one capful in water and drink one glass a day as needed for constipation 5)  Naprosyn 500 Mg Tabs (Naproxen) .... Take 1 tablet by mouth two times a day with food as needed for pain 6)  Fish Oil 1000 Mg Caps (Omega-3 fatty acids) .... Take 2 caps once daily 7)  Ketoconazole 2 % Sham (Ketoconazole) .... Apply to scalp twice per week as directed 8)  Zetia 10 Mg Tabs (Ezetimibe) .... One tablet by mouth daily for cholesterol  Hypertension  Assessment/Plan:      The patient's hypertensive risk group is category C: Target organ damage and/or diabetes.  His calculated 10 year risk of coronary heart disease is 18 %.  Today's blood pressure is 144/80.  His blood pressure goal is < 140/90.  Lipid Assessment/Plan:      Based on NCEP/ATP III, the patient's risk factor category is "history of coronary disease, peripheral vascular disease, cerebrovascular disease, or aortic aneurysm along with either diabetes, current smoker, or LDL > 130 plus HDL < 40 plus triglycerides > 200".  The patient's lipid goals are as follows: Total cholesterol goal is 200; LDL cholesterol goal is 70; HDL cholesterol goal is 40; Triglyceride goal is 150.    Patient Instructions: 1)  Schedule a lab visit in 6 weeks for lipids/lft 2)  No food after midnight before this visit 3)  Blood pressure - elevated today. 4)  This may be due to smoking.  Try hard to stop smoking. 5)  CALL for a follow up with provider in 4-6 months for high blood pressure Prescriptions: KETOCONAZOLE 2 % SHAM (KETOCONAZOLE) Apply to scalp twice per week as directed  #135ml x 1   Entered and Authorized by:   [name] FNP   Signed by:   [name] FNP on 10/30/2010   Method used:   Faxed to ...       [facility] (retail)       [address] [place], [place]  [address]       Ph: [phone]       Fax: [phone]   RxID:   [id number]    Orders Added: 1)  Est. Patient Level III [40102]

## 2011-01-02 NOTE — Assessment & Plan Note (Signed)
Summary: SHOULDER PAIN/MJD   Vital Signs:  Patient profile:   60 year old male BP sitting:   144 / 84  Vitals Entered By: Lillia Pauls CMA (July 19, 2010 1:52 PM)  Primary Provider:  Tereso Newcomer PA-C   History of Present Illness: 60 yo M MMP including CVA 6 months ago referred by Tereso Newcomer for eval of R shoulder pain. Has had shoulder pain since his stroke.  All other stroke symptoms now resolved. Mostly trouble with ROM, unable to reach over his head or reach back. States he can sleep on that side. Has not gone to PT. Pain is located on top of shoulder. Denies neck pain radiating into his fingers.  Allergies: No Known Drug Allergies  Past History:  Past Medical History: Last updated: 04/18/2010 h/o ? prostatitis Glucose Intolerance   a.  to see nutritionist  Past Surgical History: Last updated: 12/09/2009 Denies surgical history  Family History: Last updated: 12/09/2009 Alzheimer's - mom (84) Family History of CAD Male 1st degree relative <50 - DAD (?) CAD - mom ?   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    Shoulder Exam:    Shoulder: Inspection reveals no abnormalities, atrophy or asymmetry. Palpation is normal with no tenderness over AC joint or bicipital groove. ROM is decreased in all planes: 115 deg for flexion, 90-100 deg abd, IR to sacrum, ER 10-15 deg. Rotator cuff strength mildly decreased with empty can 4/5, ER 4/5, IR 5/5. Mildly positive Neer's, neg Hawkin's. Speeds and Yergason's tests normal. No labral pathology noted with negative Obrien's, negative clunk and good stability. Normal scapular function observed. Nl biceps, triceps strength  and nl median, ulnar, and radial nerve function.   Impression & Recommendations:  Problem # 1:  ADHESIVE CAPSULITIS, RIGHT (ICD-726.0)  Restricted ROM diagnostic for this, likely precipitated by CVA.  No DM or thyroid dz that he knows of. - glenohumeral CSI today, see below - referral to PT for ROM - f/u 6 weeks  Consent obtained and verified. Sterile alcohol prep. Topical analgesic spray: Ethyl chloride. Joint: glenohumeral Approached in typical fashion with: posterior directed at coracoid process Completed without difficulty Meds: 1cc kenalog, 4 cc lidocaine Needle: 25 G 1.5 inch Aftercare instructions and Red flags advised.  Orders: Joint Aspirate / Injection, Large (20610)  Complete Medication List: 1)  Lisinopril-hydrochlorothiazide 20-12.5 Mg Tabs (Lisinopril-hydrochlorothiazide) .... Take 1 tablet by mouth once a day for blood pressure 2)  Simvastatin 20 Mg Tabs (Simvastatin) .... Take 1 tab by mouth at bedtime for cholesterol 3)  Ecotrin 325 Mg Tbec (Aspirin) .... Take 1 tablet by mouth once a day 4)  Miralax Powd (Polyethylene glycol 3350) .... Dissolve one capful in water and drink one glass a day as needed for constipation 5)  Trazodone Hcl 50 Mg Tabs (Trazodone hcl) .... Take 1 tab by mouth at bedtime as needed for sleep 6)  Naprosyn 500 Mg Tabs (Naproxen) .... Take 1 tablet by mouth two times a day with food as needed for pain 7)  Doxycycline Hyclate 100 Mg Tabs (Doxycycline hyclate) .... Take 1 tablet by mouth two times a day unti all gone  Other Orders: Kenalog 10 mg inj (J3301)

## 2011-01-02 NOTE — Assessment & Plan Note (Signed)
Summary: NEW/BLOOD IN STOOL////KT   Vital Signs:  Patient profile:   60 year old male Height:      69 inches Weight:      179.7 pounds BMI:     26.63 Temp:     99.1 degrees F oral Pulse rate:   84 / minute Pulse rhythm:   regular Resp:     18 per minute BP sitting:   167 / 94  (left arm) Cuff size:   regular  Vitals Entered By: Mikey College CMA (December 09, 2009 12:12 PM) CC: NEW pt/ pt states every now and then he see's bright red blood...and also states sex life is not that well.  Is Patient Diabetic? No Pain Assessment Patient in pain? no       Does patient need assistance? Functional Status Self care Ambulation Normal Comments pt states never been to a doctor for a real visit only physicals for a job.   CC:  NEW pt/ pt states every now and then he see's bright red blood...and also states sex life is not that well. Marland Kitchen  History of Present Illness: New patient.  No prior healthcare.  He notes blood in stool every 4-5 mos. since Jan 2010.  Notes diarrhea occurs before seeing the blood.  Bright red blood.  No mucus.  No fever.  + cramping.  No tenesmus.  Describes 3-4 stools per day.  Only happens about one day.  Thinks related to drinking.  Says it stops when he stops drinking.    Also, notes difficulty achieving erections.  No AM erections.  Has had this problem for couple years.  No meds tried in past.   Habits & Providers  Alcohol-Tobacco-Diet     Alcohol drinks/day: >5     Feels need to cut down: yes     Feels annoyed by complaints: no     Feels guilty re: drinking: no     Needs 'eye opener' in am: no     Tobacco Status: current     Cigarette Packs/Day: 1.0     Year Started: 1969     Pack years: 55  Exercise-Depression-Behavior     STD Risk: never     Drug Use: past  Allergies (verified): No Known Drug Allergies  Past History:  Past Medical History: h/o ? prostatitis  Past Surgical History: Denies surgical history  Family History: Alzheimer's - mom  (39) Family History of CAD Male 1st degree relative <50 - DAD (?) CAD - mom ?   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 CV:  Denies chest pain or discomfort, difficulty breathing while lying down, fainting, shortness of breath with exertion, and swelling of feet. GI:  See HPI. GU:  Denies dysuria and hematuria. MS:  Denies joint pain. Derm:  Denies lesion(s). Psych:  Denies depression. Endo:  Denies cold intolerance and heat intolerance.  Physical Exam  General:  alert, well-developed, and well-nourished.   Head:  normocephalic and atraumatic.   Eyes:  pupils equal, pupils round, and pupils reactive to light.   Ears:  R ear normal and L ear normal.   Nose:  no external deformity.   Mouth:  pharynx pink and moist, no  erythema, and no exudates.   Neck:  supple and no carotid bruits.   Lungs:  normal breath sounds, no crackles, and no wheezes.   Heart:  normal rate and regular rhythm.   Abdomen:  soft, non-tender, normal bowel sounds, and no hepatomegaly.   Rectal:  no external abnormalities, no hemorrhoids, normal sphincter tone, no masses, no tenderness, no fissures, no fistulae, and no perianal rash.   HEME neg Genitalia:  circumcised, no hydrocele, no varicocele, no scrotal masses, and no testicular masses or atrophy.   Extremities:  no edema Neurologic:  alert & oriented X3 and cranial nerves II-XII intact.   Psych:  normally interactive.     Impression & Recommendations:  Problem # 1:  BLOOD IN STOOL (ICD-578.1)  refer to GI stool cards  Orders: Hemoccult Cards -3 specimans (take home) (42595) Gastroenterology Referral (GI) T-CBC w/Diff (63875-64332)  Problem #  2:  ESSENTIAL HYPERTENSION, BENIGN (ICD-401.1)  start norvasc check ecg hold off on ASA with BRBPR  His updated medication list for this problem includes:    Amlodipine Besylate 5 Mg Tabs (Amlodipine besylate) .Marland Kitchen... Take 1 tablet by mouth once a day  Orders: T-Comprehensive Metabolic Panel [phone]) T-Lipid Profile (63016-01093) T-TSH [phone]) EKG w/ Interpretation (93000) T-Urinalysis (54270-62376)  Problem # 3:  PREVENTIVE HEALTH CARE (ICD-V70.0) check labs  Orders: T-Lipid Profile ([id number]) T-TSH (28315-17616) T-PSA (07371-06269) T-Syphilis Test (RPR) (48546-27035) T-HIV Antibody  (Reflex) [phone]) T-Drug Screen-Urine, (single) (37169-67893) T-Urinalysis (81017-51025)  Complete Medication List: 1)  Amlodipine Besylate 5 Mg Tabs (Amlodipine besylate) .... Take 1 tablet by mouth once a day  Patient Instructions: 1)  Please schedule a follow-up appointment in 2 weeks with Miara Emminger for BP and to review tests.  Prescriptions: AMLODIPINE BESYLATE 5 MG TABS (AMLODIPINE BESYLATE) Take 1 tablet by mouth once a day  #30 x 5   Entered and Authorized by:   Tereso Newcomer PA-C   Signed by:   Tereso Newcomer PA-C on 12/09/2009   Method used:   Print then Give to Patient   RxID:   [id number]    Vital Signs:  Patient profile:   60 year old male Height:      69 inches Weight:      179.7 pounds BMI:     26.63 Temp:     99.1 degrees F oral Pulse rate:   84 / minute Pulse rhythm:   regular Resp:     18 per minute BP sitting:   167 / 94  (left arm) Cuff size:   regular  Vitals Entered By: Mikey College CMA (December 09, 2009 12:12 PM)    EKG  Procedure date:  12/09/2009  Findings:      NSR HR 67 Normal axis No isch changes LVH   Appended Document: NEW/BLOOD IN STOOL////KT    Clinical Lists Changes  Observations: Added new observation of HEMOCCULT 1: negative (12/09/2009 14:49)      Laboratory Results    Stool - Occult Blood Hemmoccult  #1: negative   Appended Document: NEW/BLOOD IN STOOL////KT PHQ 9 = 0  Appended Document: NEW/BLOOD IN STOOL////KT Patient: James Riley Note: All result statuses are Final unless otherwise noted.  Tests: (1) CBC with Diff (10010)   WBC                       7.5 K/uL                    4.0-10.5   RBC  4.70 MIL/uL                 4.22-5.81   Hemoglobin                16.0 g/dL                   62.9-52.8   Hematocrit                46.2 %                      39.0-52.0   MCV                       98.3 fL                     78.0-100.0   MCHC                      34.6 g/dL                   41.3-24.4   RDW                       14.3 %                      11.5-15.5   Platelet Count            204 K/uL                    150-400   Granulocyte %             57 %                        43-77   Absolute Gran             4.2 K/uL                    1.7-7.7   Lymph %                   33 %                        12-46   Absolute Lymph            2.5 K/uL                    0.7-4.0   Mono %                    8 %                         3-12   Absolute Mono             0.6 K/uL                    0.1-1.0   Eos %                     2 %                         0-5   Absolute Eos  0.1 K/uL                    0.0-0.7   Baso %                    1 %                         0-1   Absolute Baso             0.0 K/uL                    0.0-0.1   WBC Morphology       RESULT: Criteria for review not met   RBC Morphology       RESULT: Criteria for review not met   Smear Review       RESULT: Criteria for review not met  Tests: (2) Comprehensive Metabolic Panel (91478)   Sodium                    141 mEq/L                   135-145   Potassium                 4.4 mEq/L                   3.5-5.3   Chloride                  103 mEq/L                   96-112   CO2                       23 mEq/L                    19-32   Glucose                   90 mg/dL                     29-56   BUN                       11 mg/dL                    2-13   Creatinine                0.97 mg/dL                  0.40-1.50   Bilirubin, Total          0.6 mg/dL                   0.8-6.5   Alkaline Phosphatase      70 U/L                      39-117   AST/SGOT                  19 U/L                      0-37   ALT/SGPT                  20 U/L  0-53   Total Protein        [H]  8.4 g/dL                    4.7-8.2   Albumin                   4.9 g/dL                    9.5-6.2   Calcium                   9.6 mg/dL                   1.3-08.6  Tests: (3) Lipid Profile (57846)   Cholesterol          [H]  233 mg/dL                   9-629     ATP III Classification:           < 200        mg/dL        Desirable          200 - 239     mg/dL        Borderline High          >= 240        mg/dL        High         Triglyceride         [H]  301 mg/dL                   <528   HDL Cholesterol           49 mg/dL                    >41   Total Chol/HDL Ratio      4.8 Ratio  VLDL Cholesterol (Calc)                        [H]  60 mg/dL                    3-24  LDL Cholesterol (Calc)                        [H]  124 mg/dL                   4-01           Total Cholesterol/HDL Ratio:CHD Risk                            Coronary Heart Disease Risk Table                                            Men       Women              1/2 Average Risk              3.4        3.3                  Average Risk  5.0        4.4              2 X Average Risk              9.6        7.1              3 X Average Risk             23.4       11.0     Use the calculated Patient Ratio above and the CHD Risk table      to determine the patient's CHD Risk.     ATP III Classification (LDL):           < 100        mg/dL         Optimal          100 - 129     mg/dL         Near or Above Optimal          130 - 159     mg/dL         Borderline High          160 - 189     mg/dL          High           > 190        mg/dL         Very High        Tests: (4) TSH (23280)   TSH                       2.393 uIU/mL                0.350-4.500     ***Test methodology is 3rd generation TSH***  Tests: (5) PSA (21308)   PSA                       0.69 ng/mL                  0.10-4.00     Test Methodology: Hybritech PSA  Tests: (6) RPR Reflex to T.pallidum Ab, Total (65784)   RPR                       NON REAC                    NON REAC  Tests: (7) Drug Screen Urine, No Confirmation (77000)   Benzodiazepines           NEG                         Negative   Phencyclidine             NEG                         Negative   Cocaine Metabolites       NEG                         Negative   Amphetamines              NEG  Negative  Marijuana Metabolites                             NEG                         Negative   Opiates                   NEG                         Negative   Barbiturates              NEG                         Negative   Methadone                 NEG                         Negative   Propoxyphene              NEG                         Negative   Creatinine, Urine         228.3 mg/dL           Cutoff Values for Urine Drug Screen:             Drug Class           Cutoff (ng/mL)             Amphetamines            1000             Barbiturates             200             Cocaine Metabolites      300             Benzodiazepines          200             Methadone                300             Opiates                 2000             Phencyclidine             25             Propoxyphene             300             Marijuana Metabolites     50           For medical purposes only.  Note: An exclamation mark (!) indicates a result that was not dispersed into the flowsheet. Document Creation Date: 12/10/2009 5:13 AM _______________________________________________________________________  (1) Order result status:  Final Collection or observation date-time: 12/09/2009 21:37 Requested date-time: 12/09/2009 13:38 Receipt date-time: 12/09/2009 21:37 Reported date-time: 12/10/2009 05:14 Referring Physician:   Ordering Physician:  Alben Spittle [phone]) Specimen Source:  Source: Lajean Silvius Order Number: [id number] Lab site: [facility]  [address], [address]     [place]  [place]  [address]  (2) Order result status: Final Collection or observation date-time: 12/09/2009 21:37 Requested date-time: 12/09/2009 13:38 Receipt date-time: 12/09/2009 21:37 Reported date-time: 12/10/2009 05:14 Referring Physician:   Ordering Physician:  Alben Spittle [phone]) Specimen Source:  Source: Lajean Silvius Order Number: [id number] Lab site: [facility]     [address], [address]     [place]  [place]  [address]  (3) Order result status: Final Collection or observation date-time: 12/09/2009 21:37 Requested date-time: 12/09/2009 13:38 Receipt date-time: 12/09/2009 21:37 Reported date-time: 12/10/2009 05:14 Referring Physician:   Ordering Physician:  Alben Spittle [phone]) Specimen Source:  Source: Lajean Silvius Order Number: [id number] Lab site: [facility]     [address]     [place]  [place]  [address]  (4) Order result status: Final Collection or observation date-time: 12/09/2009 21:37 Requested date-time: 12/09/2009 13:38 Receipt date-time: 12/09/2009 21:37 Reported date-time: 12/10/2009 05:14 Referring Physician:   Ordering Physician:  Alben Spittle [phone]) Specimen Source:  Source: Lajean Silvius Order Number: [id number] Lab site: [facility]     [address]     [place]  [place]  [address]  (5) Order result status: Final Collection or observation date-time: 12/09/2009 21:37 Requested date-time: 12/09/2009 13:38 Receipt date-time: 12/09/2009 21:37 Reported date-time: 12/10/2009  05:14 Referring Physician:   Ordering Physician:  Alben Spittle [phone]) Specimen Source:  Source: Lajean Silvius Order Number: [id number] Lab site: [facility]     [address], [address]     [place]  [place]  [address]  (6) Order result status: Final Collection or observation date-time: 12/09/2009 21:37 Requested date-time: 12/09/2009 13:38 Receipt date-time: 12/09/2009 21:37 Reported date-time: 12/10/2009 05:14 Referring Physician:   Ordering Physician:  Alben Spittle [phone]) Specimen Source:  Source: Lajean Silvius Order Number: [id number] Lab site: [facility]     [address]     [place]  [place]  [address]  (7) Order result status: Final Collection or observation date-time: 12/09/2009 21:37 Requested date-time: 12/09/2009 13:38 Receipt date-time: 12/09/2009 21:37 Reported date-time: 12/10/2009 05:14 Referring Physician:   Ordering Physician:  Alben Spittle [phone]) Specimen Source:  Source: Lajean Silvius Order Number: [id number] Lab site: [facility]     [address]     [place]  [place]  [address]   Signed by Tereso Newcomer PA-C on 12/12/2009 at 10:51 PM  ________________________________________________________________________ need to start medication for cholesterol (Pravastatin 20) he needs to cut way back on alcohol what pharmacy ?   Clinical Lists Changes

## 2011-01-02 NOTE — Progress Notes (Signed)
Summary: LOST RX FOR CHANTIX  Phone Note Call from Patient Call back at Home Phone [phone]   Summary of Call: James Riley PT. [name] SAYS THAT YOU HAD GIVEN HIM A RX FOR CHANTIX AND HE LOST IT, BUT HE WOULD LIKE FOR YOU TO CALL IT INTO [facility]. Initial call taken by: Leodis Rains,  Apr 25, 2010 10:57 AM  Follow-up for Phone Call        called rx into pharmacy.. pt is aware Follow-up by: Armenia Shannon,  Apr 26, 2010 10:20 AM

## 2011-01-02 NOTE — Progress Notes (Signed)
Summary: Referral for OT  Phone Note Outgoing Call   Summary of Call: Diane... [phone]... she would like a referral for this pt to do occupational therapy Initial call taken by: Arm[place] Shannon,  May 08, 2010 10:53 AM  Follow-up for Phone Call        Order in basket to fax. Follow-up by: Tereso Newcomer PA-C,  May 08, 2010 1:48 PM  Additional Follow-up for Phone Call Additional follow up Details #1::        Referral faxed to Occupational Therapy.  Dutch Quint RN  May 08, 2010 2:22 PM

## 2011-01-02 NOTE — Progress Notes (Signed)
Summary: GI referral and PT referral  Phone Note Outgoing Call   Summary of Call: James Riley, What is the status of his GI referral? I think he may have been told to call [facility] to schedule but I'm not sure.  He cannot tell me and seems to think someone was supposed to call him. Thanks.  Also, he needs referral to physical therapy at [facility]. for balance issues and for weakness post stroke. Initial call taken by: Brynda Rim,  Apr 12, 2010 12:17 PM  Follow-up for Phone Call        Spoke to Pt's brother and informed him to call [facility] to get his brother's appt sched.Pt is also in the process of renewing his orange card Follow-up by: Candi Leash,  Apr 13, 2010 9:50 AM

## 2011-01-02 NOTE — Miscellaneous (Signed)
Summary: Rehab Report//INITIAL SUMMARY  Rehab Report//INITIAL SUMMARY   Imported By: Arta Bruce 06/13/2010 11:24:38  _____________________________________________________________________  External Attachment:    Type:   Image     Comment:   External Document

## 2011-01-02 NOTE — Letter (Signed)
Summary: Lipid Letter  [facility]  [address] [place], [place] [address]   Phone: [phone]  Fax: [phone]    12/12/2009  James Riley [address] [place], [place]  [address]  Dear James Riley:  We have carefully reviewed your last lipid profile from 12/09/2009 and the results are noted below with a summary of recommendations for lipid management.    Cholesterol:       233     Goal: <200   HDL "good" Cholesterol:   49     Goal: >40   LDL "bad" Cholesterol:   124     Goal: <100   Triglycerides:       301     Goal: <150        TLC Diet (Therapeutic Lifestyle Change): Saturated Fats & Transfatty acids should be kept < 7% of total calories ***Reduce Saturated Fats Polyunstaurated Fat can be up to 10% of total calories Monounsaturated Fat Fat can be up to 20% of total calories Total Fat should be no greater than 25-35% of total calories Carbohydrates should be 50-60% of total calories Protein should be approximately 15% of total calories Fiber should be at least 20-30 grams a day ***Increased fiber may help lower LDL Total Cholesterol should be < 200mg /day Consider adding plant stanol/sterols to diet (example: Benacol spread) ***A higher intake of unsaturated fat may reduce Triglycerides and Increase HDL    Adjunctive Measures (may lower LIPIDS and reduce risk of Heart Attack) include: Aerobic Exercise (20-30 minutes 3-4 times a week) Limit Alcohol Consumption Weight Reduction Aspirin 75-81 mg a day by mouth (if not allergic or contraindicated) - not yet with your history of blood in stool Dietary Fiber 20-30 grams a day by mouth     Current Medications: 1)    Amlodipine Besylate 5 Mg Tabs (Amlodipine besylate) .... Take 1 tablet by mouth once a day  I want to start you on a medication for cholesterol.  Please contact our office.  Sincerely,    [facility]

## 2011-01-02 NOTE — Miscellaneous (Signed)
Summary: Rehab Report//rehab d/c notes  Rehab Report//rehab d/c notes   Imported By: Arta Bruce 06/21/2010 11:16:25  _____________________________________________________________________  External Attachment:    Type:   Image     Comment:   External Document

## 2011-01-02 NOTE — Progress Notes (Signed)
Summary: bug bites?  Phone Note Call from Patient   Summary of Call: Pt c/o being bitten by something all over and has been putting alcohol and neosporin on them.  They are stinging still and swelling some also.  Pt can be reached at [phone] or cell [phone]. Initial call taken by: Vesta Mixer CMA,  July 27, 2010 12:40 PM  Follow-up for Phone Call        Was laying in bed and got bitten several times.  c/o stinging and burning bites with swelling.  Denies chest pain or SOB, just localized discomfort.  Advised to put cold paks on bites to reduce swelling, x20 minutes every two hours.  May apply calamine lotion and take Benadryl, both can be obtained over the counter.  Also to call office if condition worsens or new symptoms develop. Follow-up by: Dutch Quint RN,  July 27, 2010 1:04 PM  Additional Follow-up for Phone Call Additional follow up Details #1::        Printed out info on bed bugs.  Put in [place]'s basket.  Send to him or let him come pick up.  He should check for this.  Directions for treatment are on the sheet. Tereso Newcomer PA-C  July 27, 2010 5:13 PM  Left message on voicemail for pt. to return call.  Dutch Quint RN  July 28, 2010 9:38 AM  Confirmed address with pt. brother -- will mail info to pt.  Dutch Quint RN  July 31, 2010 9:42 AM

## 2011-01-02 NOTE — Letter (Signed)
Summary: REFERRAL//OCCUPATION THERAPY  REFERRAL//OCCUPATION THERAPY   Imported By: Arta Bruce 05/08/2010 16:31:41  _____________________________________________________________________  External Attachment:    Type:   Image     Comment:   External Document

## 2011-01-02 NOTE — Assessment & Plan Note (Signed)
Summary: hosptial follow up/ stroke//gk   Vital Signs:  Patient profile:   60 year old male Height:      69 inches Weight:      179 pounds BMI:     26.53 Temp:     98.2 degrees F oral Pulse rate:   65 / minute Pulse rhythm:   regular Resp:     18 per minute BP sitting:   124 / 73  (left arm)  Vitals Entered By: Armenia Shannon (January 31, 2010 3:37 PM) CC: xf/u.... pt says pain/pressure goes from left to right...Marland KitchenMarland Kitchen pt says this happened after the stroke...., Hypertension Management Is Patient Diabetic? No Pain Assessment Patient in pain? no       Does patient need assistance? Functional Status Self care Ambulation Normal   CC:  xf/u.... pt says pain/pressure goes from left to right...Marland KitchenMarland Kitchen pt says this happened after the stroke.... and Hypertension Management.  History of Present Illness: 60 year old male presents for post hospital physician followup.  Since I last saw him, he presented to [facility] with right-sided weakness.  His MRI showed an acute infarct affecting the left pons.  He was discharged to inpatient rehabilitation and was fine discharged home on February 14.  He was initially placed on Plavix and aspirin but discharged home on aspirin.  His blood pressure medications were adjusted.  He went home on lisinopril/HCTZ only.  I had placed him on pravastatin for his cholesterol previously.  Somehow, someone placed him on Zocor and he's been taking both medications since discharge.  He is not taking an aspirin at this time.  He is follow up with the rehabilitation doctor in a couple of weeks.  He still has some residual right-sided weakness.  This is improving.  He has some right hand edema.  Of note, he did have some alcohol withdrawal symptoms in the hospital.  He has not started back to drinking since discharge from the hospital.  He has however started back smoking.  He never got the chantix field.  He would like to try this again to help him quit  smoking.  Hypertension History:      He denies chest pain, dyspnea with exertion, and syncope.        Positive major cardiovascular risk factors include male age 59 years old or older, hyperlipidemia, hypertension, and current tobacco user.        Positive history for target organ damage include prior stroke (or TIA).     Problems Prior to Update: 1)  Cerebrovascular Accident  (ICD-434.91) 2)  Erectile Dysfunction, Organic  (ICD-607.84) 3)  Tobacco Abuse  (ICD-305.1) 4)  Alcohol Abuse  (ICD-305.00) 5)  Hyperlipidemia  (ICD-272.4) 6)  Preventive Health Care  (ICD-V70.0) 7)  Blood in Stool  (ICD-578.1) 8)  Essential Hypertension, Benign  (ICD-401.1) 9)  Family History of Cad Male 1st Degree Relative <50  (ICD-V17.3)  Current Medications (verified): 1)  Amlodipine Besylate 5 Mg Tabs (Amlodipine Besylate) .... Take 1 Tablet By Mouth Once A Day 2)  Lisinopril-Hydrochlorothiazide 20-12.5 Mg Tabs (Lisinopril-Hydrochlorothiazide) .... Take 1 Tablet By Mouth Once A Day For Blood Pressure 3)  Pravastatin Sodium 20 Mg Tabs (Pravastatin Sodium) .... Take 1 Tab By Mouth At Bedtime For Cholesterol 4)  Chantix Starting Month Pak 0.5 Mg X 11 & 1 Mg X 42 Tabs (Varenicline Tartrate) .... As Directed 5)  Chantix Continuing Month Pak 1 Mg Tabs (Varenicline Tartrate) .... As Directed  Allergies (verified): No Known Drug Allergies  Physical Exam  General:  alert, well-developed, and well-nourished.   Head:  normocephalic and atraumatic.   Neck:  supple and no carotid bruits.   Lungs:  normal breath sounds.   Heart:  normal rate and regular rhythm.   Neurologic:  alert & oriented X3 and cranial nerves II-XII intact.   very mild weakness noted in RUE and RLE Skin:  turgor normal.   Psych:  normally interactive.     Impression & Recommendations:  Problem # 1:  CEREBROVASCULAR ACCIDENT (ICD-434.91)  MRI with left pons CVA MRA was neg No ICA stenosis on dopplers Echo with EF 55-60% and no  emboli  Patient supposed to be taking ASA He stopped. . . seems confused about meds  Still seeing Dr. Wynn Banker for rehab.  Next appt 3/25  Right sided weakness improving.    His updated medication list for this problem includes:    Ecotrin 325 Mg Tbec (Aspirin) .Marland Kitchen... Take 1 tablet by mouth once a day  Problem # 2:  ESSENTIAL HYPERTENSION, BENIGN (ICD-401.1) taking lisinopril/hct only was on clonidine when d/c from hosp bp looks ok continue current med cmet in hosp in 01/2010 was ok  The following medications were removed from the medication list:    Amlodipine Besylate 5 Mg Tabs (Amlodipine besylate) .Marland Kitchen... Take 1 tablet by mouth once a day His updated medication list for this problem includes:    Lisinopril-hydrochlorothiazide 20-12.5 Mg Tabs (Lisinopril-hydrochlorothiazide) .Marland Kitchen... Take 1 tablet by mouth once a day for blood pressure  Problem # 3:  HYPERLIPIDEMIA (ICD-272.4) somehow got zocor at d/c and also taking pravastatin told him to stop the zocor continue pravastatin check lipids in 2 mos  His updated medication list for this problem includes:    Pravastatin Sodium 20 Mg Tabs (Pravastatin sodium) .Marland Kitchen... Take 1 tab by mouth at bedtime for cholesterol  Problem # 4:  ALCOHOL ABUSE (ICD-305.00) had some withdrawal symptoms in the hosp denies drinking since returning home discussed seeing substance abuse counselor but he declines advised him that the temptation will come back and he will need help to stay sober  Problem # 5:  BLOOD IN STOOL (ICD-578.1) no recurrence has not seen GI yet cbc in 01/2010 in hosp was normal  Problem # 6:  TOBACCO ABUSE (ICD-305.1) wants to try chantix never go rx filled . . .needs again denies depression or suicidal thoughts  His updated medication list for this problem includes:    Chantix Starting Month Pak 0.5 Mg X 11 & 1 Mg X 42 Tabs (Varenicline tartrate) .Marland Kitchen... As directed    Chantix Continuing Month Pak 1 Mg Tabs (Varenicline  tartrate) .Marland Kitchen... As directed  Complete Medication List: 1)  Lisinopril-hydrochlorothiazide 20-12.5 Mg Tabs (Lisinopril-hydrochlorothiazide) .... Take 1 tablet by mouth once a day for blood pressure 2)  Pravastatin Sodium 20 Mg Tabs (Pravastatin sodium) .... Take 1 tab by mouth at bedtime for cholesterol 3)  Chantix Starting Month Pak 0.5 Mg X 11 & 1 Mg X 42 Tabs (Varenicline tartrate) .... As directed 4)  Chantix Continuing Month Pak 1 Mg Tabs (Varenicline tartrate) .... As directed 5)  Ecotrin 325 Mg Tbec (Aspirin) .... Take 1 tablet by mouth once a day  Hypertension Assessment/Plan:      The patient's hypertensive risk group is category C: Target organ damage and/or diabetes.  His calculated 10 year risk of coronary heart disease is 14 %.  Today's blood pressure is 124/73.  His blood pressure goal is < 140/90.  Patient Instructions:  1)  Start taking an Aspirin 325 mg once daily. 2)  Return to the lab in 2 mos for CMET, Lipids (Dx 272.4).  Come fasting (nothing to eat or drink after midnight the night before except water). 3)  Tobacco is very bad for your health and your loved ones ! You should stop smoking !  4)  Stop smoking tips: Choose a quit date. Cut down before the quit date. Decide what you will do as a substitute when you feel the urge to smoke(gum, toothpick, exercise).  5)  Please schedule a follow-up appointment in 2 months with Karla Pavone one week after lab work for cholesterol and blood pressure. 6)    Prescriptions: CHANTIX CONTINUING MONTH PAK 1 MG TABS (VARENICLINE TARTRATE) as directed  #1 pack x 2   Entered and Authorized by:   Tereso Newcomer PA-C   Signed by:   Tereso Newcomer PA-C on 01/31/2010   Method used:   Print then Give to Patient   RxID:   [id number] CHANTIX STARTING MONTH PAK 0.5 MG X 11 & 1 MG X 42 TABS (VARENICLINE TARTRATE) as directed  #1 pack x 0   Entered and Authorized by:   Tereso Newcomer PA-C   Signed by:   Tereso Newcomer PA-C on 01/31/2010   Method used:    Print then Give to Patient   RxID:   [id number] ECOTRIN 325 MG TBEC (ASPIRIN) Take 1 tablet by mouth once a day  #30 x 11   Entered and Authorized by:   Tereso Newcomer PA-C   Signed by:   Tereso Newcomer PA-C on 01/31/2010   Method used:   Print then Give to Patient   RxID:   [id number]

## 2011-01-02 NOTE — Letter (Signed)
Summary: Lipid Letter  [facility]  [address] [place], [place] [address]   Phone: [phone]  Fax: [phone]    10/20/2010  Benigno Check [address] [place], [place]  [address]  Dear Windy Fast:  We have carefully reviewed your last lipid profile from 10/18/2010 and the results are noted below with a summary of recommendations for lipid management.    Cholesterol:       179     Goal: less than 200   HDL "good" Cholesterol:   37     Goal: greater than 40   LDL "bad" Cholesterol:   90     Goal: less than 100   Triglycerides:       259     Goal: less than 150    Your triglycerides are still elevated. You should have been notified by this office to start zetia (new prescription sent to the pharmacy).  You should also continue to take simvastatin nightly.    Current Medications: 1)    Lisinopril-hydrochlorothiazide 20-12.5 Mg Tabs (Lisinopril-hydrochlorothiazide) .... Take 1 tablet by mouth once a day for blood pressure 2)    Simvastatin 20 Mg Tabs (Simvastatin) .... Take 1 tab by mouth at bedtime for cholesterol 3)    Ecotrin 325 Mg Tbec (Aspirin) .... Take 1 tablet by mouth once a day 4)    Miralax  Powd (Polyethylene glycol 3350) .... Dissolve one capful in water and drink one glass a day as needed for constipation 5)    Ambien 5 Mg Tabs (Zolpidem tartrate) .... Take 1 tab by mouth at bedtime as needed for sleep 6)    Naprosyn 500 Mg Tabs (Naproxen) .... Take 1 tablet by mouth two times a day with food as needed for pain 7)    Fish Oil 1000 Mg Caps (Omega-3 fatty acids) .... Take 2 caps once daily 8)    Ketoconazole 2 % Sham (Ketoconazole) .... Apply to scalp every day 9)    Terbinafine Hcl 250 Mg Tabs (Terbinafine hcl) .... Take one by mouth once daily for 6 weeks 10)    Zetia 10 Mg Tabs (Ezetimibe) .... One tablet by mouth daily for cholesterol  If you have any questions, please call. We appreciate being able to work with you.   Sincerely,       Juliet Rude [facility]

## 2011-01-02 NOTE — Assessment & Plan Note (Signed)
Summary: fu previous stroke / Right arm pain//gk   Vital Signs:  Patient profile:   60 year old male Height:      69 inches Weight:      185 pounds BMI:     27.42 Temp:     98.0 degrees F oral Pulse rate:   94 / minute Pulse rhythm:   regular Resp:     16 per minute BP sitting:   151 / 80  (left arm) Cuff size:   regular  Vitals Entered By: Armenia Shannon (July 14, 2010 12:00 PM) CC: pt is here for right arm pain.... pt wants something to help him sleep at night and he want another med because he feels as if the triamcinolone is not working Is Patient Diabetic? Yes Pain Assessment Patient in pain? no      CBG Result 122  Does patient need assistance? Functional Status Self care Ambulation Normal   Primary Care Rowene Suto:  Tereso Newcomer PA-C  CC:  pt is here for right arm pain.... pt wants something to help him sleep at night and he want another med because he feels as if the triamcinolone is not working.  History of Present Illness: Here for f/u. Has right shoulder pain.  Present since CVA.  Unable to raise arm above head.  Went to OT but canceled after initial eval.  Has had pain in arm.  Not taking anything. Rash on scalp:  Using TAC cream.  Not helping.  Painful.  Has pus coming out at times. Insomnia:  Cannot sleep.  Denies depression.  Has had a hard time since he was in hosp for CVA.  Had tried benadryl without success. HTN:  not taking meds.  OUt for about a week now.   Current Medications (verified): 1)  Lisinopril-Hydrochlorothiazide 20-12.5 Mg Tabs (Lisinopril-Hydrochlorothiazide) .... Take 1 Tablet By Mouth Once A Day For Blood Pressure 2)  Simvastatin 20 Mg Tabs (Simvastatin) .... Take 1 Tab By Mouth At Bedtime For Cholesterol 3)  Ecotrin 325 Mg Tbec (Aspirin) .... Take 1 Tablet By Mouth Once A Day 4)  Miralax  Powd (Polyethylene Glycol 3350) .... Dissolve One Capful in Water and Drink One Glass A Day As Needed For Constipation  Allergies (verified): No  Known Drug Allergies  Review of Systems Psych:  Denies depression and suicidal thoughts/plans.  Physical Exam  General:  alert, well-developed, and well-nourished.   Head:  scattered nodules about scalp  tender to the touch purulent drainage Neck:  supple.   Lungs:  normal breath sounds.   Heart:  normal rate and regular rhythm.   Abdomen:  soft and non-tender.   Msk:  right shoulder: ? pain with palp over coracoid process limited AROM with abduction to about 140 degrees + pain with empty can test no crepitus + pain with int and ext rotation  Neurologic:  alert & oriented X3 and cranial nerves II-XII intact.   Psych:  normally interactive, good eye contact, and not depressed appearing.     Impression & Recommendations:  Problem # 1:  GLUCOSE INTOLERANCE (ICD-271.3)  A1C is 6 . . . better continue to monitor saw Susie Piper  Orders: T-Urine Microalbumin w/creat. ratio [phone])  Problem # 2:  ESSENTIAL HYPERTENSION, BENIGN (ICD-401.1) Assessment: Deteriorated  no meds in a week or 2 noncompliant needs refills  His updated medication list for this problem includes:    Lisinopril-hydrochlorothiazide 20-12.5 Mg Tabs (Lisinopril-hydrochlorothiazide) .Marland Kitchen... Take 1 tablet by mouth once a day for blood pressure  Orders: T-Comprehensive Metabolic Panel ([phone]) T-Lipid Profile (28413-24401)  Problem # 3:  CEREBROVASCULAR ACCIDENT (ICD-434.91) needs to remain on ASA explained this to him today  His updated medication list for this problem includes:    Ecotrin 325 Mg Tbec (Aspirin) .Marland Kitchen... Take 1 tablet by mouth once a day  Problem # 4:  ALCOHOL ABUSE (ICD-305.00) states no alcohol "in a while now"  Problem # 5:  TOBACCO ABUSE (ICD-305.1) rec cessation  Problem # 6:  HYPERLIPIDEMIA (ICD-272.4)  off meds check FLP  His updated medication list for this problem includes:    Simvastatin 20 Mg Tabs (Simvastatin) .Marland Kitchen... Take 1 tab by mouth at bedtime for  cholesterol  Orders: T-Comprehensive Metabolic Panel ([id number]) T-Lipid Profile (03474-25956)  Problem # 7:  INSOMNIA (ICD-780.52) trazodone Rx  Problem # 8:  SHOULDER PAIN, RIGHT (ICD-719.41)  ? impingement syndrome check xray tx with nsaids refer to sports med clinic for eval  His updated medication list for this problem includes:    Ecotrin 325 Mg Tbec (Aspirin) .Marland Kitchen... Take 1 tablet by mouth once a day    Naprosyn 500 Mg Tabs (Naproxen) .Marland Kitchen... Take 1 tablet by mouth two times a day with food as needed for pain  Orders: Sports Medicine (Sports Med)  Problem # 9:  FOLLICULITIS (ICD-704.8) of scalp stop TAC doxy for a week recheck in one week ? need derm referral if not clearing  Problem # 10:  BLOOD IN STOOL (ICD-578.1) never saw GI refer again  Orders: T-Hemoccult Cards-Multiple (82270)  Complete Medication List: 1)  Lisinopril-hydrochlorothiazide 20-12.5 Mg Tabs (Lisinopril-hydrochlorothiazide) .... Take 1 tablet by mouth once a day for blood pressure 2)  Simvastatin 20 Mg Tabs (Simvastatin) .... Take 1 tab by mouth at bedtime for cholesterol 3)  Ecotrin 325 Mg Tbec (Aspirin) .... Take 1 tablet by mouth once a day 4)  Miralax Powd (Polyethylene glycol 3350) .... Dissolve one capful in water and drink one glass a day as needed for constipation 5)  Trazodone Hcl 50 Mg Tabs (Trazodone hcl) .... Take 1 tab by mouth at bedtime as needed for sleep 6)  Naprosyn 500 Mg Tabs (Naproxen) .... Take 1 tablet by mouth two times a day with food as needed for pain 7)  Doxycycline Hyclate 100 Mg Tabs (Doxycycline hyclate) .... Take 1 tablet by mouth two times a day unti all gone  Patient Instructions: 1)  Tobacco is very bad for your health and your loved ones ! You should stop smoking !  2)  Stop smoking tips: Choose a quit date. Cut down before the quit date. Decide what you will do as a substitute when you feel the urge to smoke(gum, toothpick, exercise).  3)  Take Naprosyn two  times a day with food for 3 days, then two times a day with food as needed for pain. 4)  Try to keep moving your arm as much as possible. 5)  Someone will call you to set up an appointment with the [facility] to look at your shoulder.  If you do not get a call in the next 2 weeks, call me. 6)  Use the trazodone only as needed.  Never take anything for sleep on a daily basis. 7)  NO tv or reading in bed.  No coffee or soda before bed.  No exercise before bed.  Wake up at the same time every day. 8)  I am sending the referral out again for the gastroenterologist to schedule a colonoscopy.  If  you do not get a call in 2 weeks, call me.   9)  Restart your blood pressure medicines. 10)  Follow up with [facility] in 2 months for blood pressure. 11)  Take doxycycline until all gone. 12)  Schedule follow up in one week for recheck on scalp only.  Ok to put on Triage RN schedule. Prescriptions: DOXYCYCLINE HYCLATE 100 MG TABS (DOXYCYCLINE HYCLATE) Take 1 tablet by mouth two times a day unti all gone  #20 x 0   Entered and Authorized by:   Tereso Newcomer PA-C   Signed by:   Tereso Newcomer PA-C on 07/14/2010   Method used:   Print then Give to Patient   RxID:   [id number] NAPROSYN 500 MG TABS (NAPROXEN) Take 1 tablet by mouth two times a day with food as needed for pain  #30 x 2   Entered and Authorized by:   Tereso Newcomer PA-C   Signed by:   Tereso Newcomer PA-C on 07/14/2010   Method used:   Print then Give to Patient   RxID:   [id number] TRAZODONE HCL 50 MG TABS (TRAZODONE HCL) Take 1 tab by mouth at bedtime as needed for sleep  #20 per month x 1   Entered and Authorized by:   Tereso Newcomer PA-C   Signed by:   Tereso Newcomer PA-C on 07/14/2010   Method used:   Print then Give to Patient   RxID:   [id number] ECOTRIN 325 MG TBEC (ASPIRIN) Take 1 tablet by mouth once a day  #30 x 11   Entered and Authorized by:   Tereso Newcomer PA-C   Signed by:   Tereso Newcomer PA-C on 07/14/2010    Method used:   Print then Give to Patient   RxID:   [id number] SIMVASTATIN 20 MG TABS (SIMVASTATIN) Take 1 tab by mouth at bedtime for cholesterol  #30 x 5   Entered and Authorized by:   Tereso Newcomer PA-C   Signed by:   Tereso Newcomer PA-C on 07/14/2010   Method used:   Print then Give to Patient   RxID:   [id number] LISINOPRIL-HYDROCHLOROTHIAZIDE 20-12.5 MG TABS (LISINOPRIL-HYDROCHLOROTHIAZIDE) Take 1 tablet by mouth once a day for blood pressure  #30 x 5   Entered and Authorized by:   Tereso Newcomer PA-C   Signed by:   Tereso Newcomer PA-C on 07/14/2010   Method used:   Print then Give to Patient   RxID:   [id number]   Laboratory Results   Blood Tests     HGBA1C: 6.0%   (Normal Range: Non-Diabetic - 3-6%   Control Diabetic - 6-8%) CBG Random:: 122

## 2011-01-02 NOTE — Letter (Signed)
Summary: [facility] PT Referral Form  [facility] PT Referral Form   Imported By: Marily Memos 07/19/2010 15:09:32  _____________________________________________________________________  External Attachment:    Type:   Image     Comment:   External Document

## 2011-01-02 NOTE — Progress Notes (Signed)
  Phone Note Call from Patient Call back at [facility] Phone [phone] Call back at [phone]   Summary of Call: Since last month, the pt had been taking cholesterol and bp medication and he is suffering from constipation.  Pt doesn''t know which one is affecting him because he takes both medication at the same time.  Pt needs somebody call her back and probably take somthing that can help him with his bowel movements. [facility]) Alben Spittle PA-c Initial call taken by: Manon Hilding,  February 14, 2010 11:15 AM  Follow-up for Phone Call        spoke with pt and he said he is constapated and has not had a bowel movement in two to three days...Marland Kitchen pt says he did take magn. and has worked so far.... pt wanted know if he can have a suppostory or any med to help him go.... [facility]  Follow-up by: Armenia Shannon,  February 15, 2010 4:14 PM  Additional Follow-up for Phone Call Additional follow up Details #1::        Increase fiber in diet.  Send high fiber diet info.  Drink plenty of water.  Will send Rx for miralax to pharmacy.  Drink one glass a day as needed. Additional Follow-up by: Tereso Newcomer PA-C,  February 15, 2010 4:21 PM    Additional Follow-up for Phone Call Additional follow up Details #2::    Left message on answering machine for pt to call back...Marland KitchenMarland KitchenArmenia Shannon  February 17, 2010 11:35 AM   Left message on answering machine for pt to call back.Marland KitchenMarland KitchenMarland KitchenArmenia Shannon  February 20, 2010 4:46 PM   pt is aware.... Armenia Shannon  February 21, 2010 9:54 AM   New/Updated Medications: MIRALAX  POWD (POLYETHYLENE GLYCOL 3350) Dissolve one capful in water and drink one glass a day as needed for constipation Prescriptions: MIRALAX  POWD (POLYETHYLENE GLYCOL 3350) Dissolve one capful in water and drink one glass a day as needed for constipation  #1 bottle x 3   Entered and Authorized by:   Tereso Newcomer PA-C   Signed by:   Tereso Newcomer PA-C on 02/15/2010   Method used:   Faxed to ...     [facility]retail)       [address] [place], [place]  [address]       Ph: [phone]       Fax: [phone]   RxID:   [id number]

## 2011-01-02 NOTE — Assessment & Plan Note (Signed)
Summary: f/u in triage per Lorin Picket, check scalp lesion /tmm  Nurse Visit   Primary Care Provider:  Tereso Newcomer PA-C  CC:  recheck of scalp lesions.  History of Present Illness: Has been on doxycycline for scalp lesion.  Had been draining purulent drainage -- seen by provider on 07/14/10.  CC: recheck of scalp lesions Pain Assessment Patient in pain? no       Does patient need assistance? Functional Status Self care Ambulation Normal   Allergies: No Known Drug Allergies  Orders Added: 1)  Est. Patient Level I [16109]  Review of Systems Derm:  Complains of lesion(s); top of head, denies pain.   Impression & Recommendations:  Problem # 1:  FOLLICULITIS (ICD-704.8)  Complete Medication List: 1)  Lisinopril-hydrochlorothiazide 20-12.5 Mg Tabs (Lisinopril-hydrochlorothiazide) .... Take 1 tablet by mouth once a day for blood pressure 2)  Simvastatin 20 Mg Tabs (Simvastatin) .... Take 1 tab by mouth at bedtime for cholesterol 3)  Ecotrin 325 Mg Tbec (Aspirin) .... Take 1 tablet by mouth once a day 4)  Miralax Powd (Polyethylene glycol 3350) .... Dissolve one capful in water and drink one glass a day as needed for constipation 5)  Trazodone Hcl 50 Mg Tabs (Trazodone hcl) .... Take 1 tab by mouth at bedtime as needed for sleep 6)  Naprosyn 500 Mg Tabs (Naproxen) .... Take 1 tablet by mouth two times a day with food as needed for pain 7)  Doxycycline Hyclate 100 Mg Tabs (Doxycycline hyclate) .... Take 1 tablet by mouth two times a day unti all gone   Patient Instructions: 1)  Complete antibiotics. 2)  Follow-up as needed  3)  Keep scheduled appointment with provider.   Physical Exam  Head:  scalp lesion to top of head, skin intact, no drainage, no erythema or edema

## 2011-01-02 NOTE — Miscellaneous (Signed)
Summary: Rehab Report//DISCHARGE SUMMARY  Rehab Report//DISCHARGE SUMMARY   Imported By: Arta Bruce 05/22/2010 15:46:28  _____________________________________________________________________  External Attachment:    Type:   Image     Comment:   External Document

## 2011-01-02 NOTE — Assessment & Plan Note (Signed)
Summary: 2 month fu////kt   Vital Signs:  Patient profile:   60 year old male Height:      69 inches Weight:      185 pounds BMI:     27.42 Temp:     97.9 degrees F oral Pulse rate:   90 / minute Pulse rhythm:   regular BP sitting:   136 / 75  (left arm) Cuff size:   regular  Vitals Entered By: Armenia Shannon (Apr 12, 2010 11:58 AM) CC: two month f/u... pt is concerned about balance and SOB...., Hypertension Management Pain Assessment Patient in pain? no       Does patient need assistance? Functional Status Self care Ambulation Impaired:Risk for fall   Primary Care Provider:  Tereso Newcomer PA-C  CC:  two month f/u... pt is concerned about balance and SOB.... and Hypertension Management.  History of Present Illness: Here for f/u. No show last week.  Card expired the next day.  I agreed to see him today.  He will get an eligibility appt. He is c/o being weak.   Also, losing balance.  States he has fallen a couple times.  Using a single point cane. Right side still very weak.  Notes he gets weak doing anything . . . even eating. Notes some low back pain.  Says it gets better with urinating.  Reports increased frequency.  No dysuria or hematuria.  No decreased stream or nocturia.   No hesitancy or urgency.  No perineal pain.  No penile discharge. Still smoking.  Still drinking alcohol.  States he is only drinking 2 beers a day and drinks every day. Discussed referral to substance abuse counselor last time but he declined. Gave him Rx for Chantix last time but he did not get filled.   Hypertension History:      He denies headache, chest pain, dyspnea with exertion, and syncope.        Positive major cardiovascular risk factors include male age 37 years old or older, hyperlipidemia, hypertension, and current tobacco user.        Positive history for target organ damage include prior stroke (or TIA).     Problems Prior to Update: 1)  Gait Imbalance  (ICD-781.2) 2)  Fatigue   (ICD-780.79) 3)  Cerebrovascular Accident  (ICD-434.91) 4)  Erectile Dysfunction, Organic  (ICD-607.84) 5)  Tobacco Abuse  (ICD-305.1) 6)  Alcohol Abuse  (ICD-305.00) 7)  Hyperlipidemia  (ICD-272.4) 8)  Preventive Health Care  (ICD-V70.0) 9)  Blood in Stool  (ICD-578.1) 10)  Essential Hypertension, Benign  (ICD-401.1) 11)  Family History of Cad Male 1st Degree Relative <50  (ICD-V17.3)  Current Medications (verified): 1)  Lisinopril-Hydrochlorothiazide 20-12.5 Mg Tabs (Lisinopril-Hydrochlorothiazide) .... Take 1 Tablet By Mouth Once A Day For Blood Pressure 2)  Simvastatin 20 Mg Tabs (Simvastatin) .... Take 1 Tab By Mouth At Bedtime For Cholesterol 3)  Chantix Starting Month Pak 0.5 Mg X 11 & 1 Mg X 42 Tabs (Varenicline Tartrate) .... As Directed 4)  Chantix Continuing Month Pak 1 Mg Tabs (Varenicline Tartrate) .... As Directed 5)  Ecotrin 325 Mg Tbec (Aspirin) .... Take 1 Tablet By Mouth Once A Day 6)  Miralax  Powd (Polyethylene Glycol 3350) .... Dissolve One Capful in Water and Drink One Glass A Day As Needed For Constipation  Allergies (verified): No Known Drug Allergies  Review of Systems      See HPI General:  Denies chills, fever, and weight loss. CV:  Denies chest pain or  discomfort, fainting, and shortness of breath with exertion. Resp:  Denies cough. GI:  Denies bloody stools and dark tarry stools. GU:  See HPI. Neuro:  See HPI.  Physical Exam  General:  alert, well-developed, and well-nourished.   Head:  normocephalic and atraumatic.   Eyes:  pupils equal, pupils round, pupils reactive to light, and no optic disk abnormalities.   Neck:  supple, no thyromegaly, and no cervical lymphadenopathy.   Lungs:  normal breath sounds, no crackles, and no wheezes.   Heart:  normal rate and regular rhythm.   Abdomen:  soft, non-tender, normal bowel sounds, and no hepatomegaly.   Msk:  walks with single point cane drifts to the right with ambulation  Neurologic:  alert &  oriented X3 and cranial nerves II-XII intact.   mild weakness noted in RUE  Psych:  normally interactive.     Impression & Recommendations:  Problem # 1:  FATIGUE (ICD-780.79)  present since his stroke and seems to be related also, having some balance issues and he states he has fallen a few times states he never had PT after d/c from the hospital I suspect all related to this will check some labs and a u/a since he is having frequency and some back pain  Orders: T-Comprehensive Metabolic Panel [id number]) T-CBC w/Diff ([id number]) T-TSH ([id number]) UA Dipstick w/o Micro (manual) ([id number]) T-Culture, Urine ([id number])  Problem # 2:  CEREBROVASCULAR ACCIDENT (ICD-434.91)  refer to PT  His updated medication list for this problem includes:    Ecotrin 325 Mg Tbec (Aspirin) .Marland Kitchen... Take 1 tablet by mouth once a day  Orders: Physical Therapy Referral (PT)  Problem # 3:  ESSENTIAL HYPERTENSION, BENIGN (ICD-401.1) controlled  His updated medication list for this problem includes:    Lisinopril-hydrochlorothiazide 20-12.5 Mg Tabs (Lisinopril-hydrochlorothiazide) .Marland Kitchen... Take 1 tablet by mouth once a day for blood pressure  Orders: T-Comprehensive Metabolic Panel ([id number])  Problem # 4:  HYPERLIPIDEMIA (ICD-272.4) not fasting needs to come back for FLP  His updated medication list for this problem includes:    Simvastatin 20 Mg Tabs (Simvastatin) .Marland Kitchen... Take 1 tab by mouth at bedtime for cholesterol  Orders: T-Comprehensive Metabolic Panel ([id number])  Problem # 5:  TOBACCO ABUSE (ICD-305.1) advised cessation yet again not taking chantix  The following medications were removed from the medication list:    Chantix Starting Month Pak 0.5 Mg X 11 & 1 Mg X 42 Tabs (Varenicline tartrate) .Marland Kitchen... As directed    Chantix Continuing Month Pak 1 Mg Tabs (Varenicline tartrate) .Marland Kitchen... As directed  Problem # 6:  ALCOHOL ABUSE (ICD-305.00) rec referral to substance abuse  counselor yet again he declined and states he can do it on his own  Problem # 7:  BLOOD IN STOOL (ICD-578.1) will check on referral he denies seeing any blood since his first visit to our clinic  Orders: T-CBC w/Diff ([id number])  Complete Medication List: 1)  Lisinopril-hydrochlorothiazide 20-12.5 Mg Tabs (Lisinopril-hydrochlorothiazide) .... Take 1 tablet by mouth once a day for blood pressure 2)  Simvastatin 20 Mg Tabs (Simvastatin) .... Take 1 tab by mouth at bedtime for cholesterol 3)  Ecotrin 325 Mg Tbec (Aspirin) .... Take 1 tablet by mouth once a day 4)  Miralax Powd (Polyethylene glycol 3350) .... Dissolve one capful in water and drink one glass a day as needed for constipation  Hypertension Assessment/Plan:      The patient's hypertensive risk group is category C: Target organ damage and/or diabetes.  His calculated 10 year risk  of coronary heart disease is 18 %.  Today's blood pressure is 136/75.  His blood pressure goal is < 140/90.  Patient Instructions: 1)  Schedule fasting labs (lipids) once his card is renewed.  Do not eat or drink anything after midnight except water. 2)  We will set you up with physical therapy for your balance and weakness on your right side.  Someone will call you. 3)  I will check on your referral to get a colonoscopy.  I recommend you get this done once we find out what needs to happen next. 4)  Drink plenty of water.  Avoid caffeine, coffee, tea, soda, etc. 5)  Reduce alcohol intake.  This may all make you urinate more than normal.  Schedule a follow up for this if it does not get better or gets worse. 6)  Please schedule a follow-up appointment in 3 months with Annelie Boak for blood pressure.   Laboratory Results   Urine Tests    Routine Urinalysis   Glucose: negative   (Normal Range: Negative) Bilirubin: negative   (Normal Range: Negative) Ketone: negative   (Normal Range: Negative) Spec. Gravity: <1.005   (Normal Range: 1.003-1.035) Blood:  negative   (Normal Range: Negative) pH: 5.0   (Normal Range: 5.0-8.0) Protein: negative   (Normal Range: Negative) Urobilinogen: 0.2   (Normal Range: 0-1) Nitrite: negative   (Normal Range: Negative) Leukocyte Esterace: negative   (Normal Range: Negative)

## 2011-01-02 NOTE — Letter (Signed)
Summary: INPATIENT REHAB CASE MANAGEMENT  INPATIENT REHAB CASE MANAGEMENT   Imported By: Arta Bruce 03/16/2010 12:47:13  _____________________________________________________________________  External Attachment:    Type:   Image     Comment:   External Document

## 2011-01-02 NOTE — Letter (Signed)
Summary: NUTRITION SUMMARY  NUTRITION SUMMARY   Imported By: Arta Bruce 05/15/2010 12:37:55  _____________________________________________________________________  External Attachment:    Type:   Image     Comment:   External Document

## 2011-01-02 NOTE — Progress Notes (Signed)
Summary: CANT SLEEP  Phone Note Call from Patient   Summary of Call: Tru Rana PT. MR Wahlberg CALLED [facility]=USE, HE IS HAVING TROUBLE SLEEPING AT NIGHT AND WANTS TO KNOW IF YOU CAN CALL SOMETHING IN FOR HIM. HE USES [facility] ON [address]. Initial call taken by: Leodis Rains,  June 12, 2010 9:56 AM  Follow-up for Phone Call        spoke with pt and he let me know that since his stroke in Feb. pt says he is unable to sleep til about 5:30am and will see til about 9:30... pt says they did give him a med in hospital but doesnt know the name... pt says he has not tried any OTC meds to help sleep... Follow-up by: Armenia Shannon,  June 12, 2010 10:22 AM  Additional Follow-up for Phone Call Additional follow up Details #1::        No reading or watching tv in bed.  No drinking alcohol or caffeine before bed.  No exercise close to bedtime. Get up at the same time each day regardless of how you slept. He can try Benadryl 25 mg at bedtime as needed for sleep. If no success, make appt. Additional Follow-up by: Tereso Newcomer PA-C,  June 12, 2010 2:17 PM    Additional Follow-up for Phone Call Additional follow up Details #2::    Advised pt. of provider's recommendations - verbalized understanding.    Follow-up by: Dutch Quint RN,  June 13, 2010 5:19 PM

## 2011-01-02 NOTE — Miscellaneous (Signed)
Summary: Rehab Report//INITIAL SUMMARY  Rehab Report//INITIAL SUMMARY   Imported By: Arta Bruce 05/10/2010 11:48:06  _____________________________________________________________________  External Attachment:    Type:   Image     Comment:   External Document

## 2011-01-02 NOTE — Letter (Signed)
Summary: *HSN Results Follow up  [facility]  [address] [place], [place] [address]   Phone: [phone]  Fax: [phone]      12/26/2009   RAYNOR CALCATERRA [address] [place], [place]  [address]   Dear  [name],                            ____S.Drinkard,FNP   ____D. Gore,FNP       ____B. McPherson,MD   ____V. Rankins,MD    ____E. Mulberry,MD    ____N. Daphine Deutscher, FNP  ____D. Reche Dixon, MD    ____K. Philipp Deputy, MD    __x__S. Alben Spittle, PA-C     This letter is to inform you that your recent test(s):  _______Pap Smear    ___x____Lab Test     _______X-ray    ___x____ is within acceptable limits  _______ requires a medication change  _______ requires a follow-up lab visit  _______ requires a follow-up visit with your provider   Comments: Stool cards were negative for blood.       _________________________________________________________ If you have any questions, please contact our office                     Sincerely,  Tereso Newcomer PA-C [facility]

## 2011-01-04 NOTE — Letter (Signed)
Summary: Lipid Letter  [facility]  [address] [place], [place] [address]   Phone: [phone]  Fax: [phone]    12/07/2010  Anirudh Baiz [address] [place], [place]  [address]  Dear Windy Fast:  We have carefully reviewed your last lipid profile from 12/05/2010 and the results are noted below with a summary of recommendations for lipid management.    Cholesterol:       133     Goal: less than 200   HDL "good" Cholesterol:   37     Goal: less than 40   LDL "bad" Cholesterol:   55     Goal: greater than 100   Triglycerides:      204    Goal: less than 150    Your triglycerides are still elevated but overall your cholesterol is improved.  Keep taking your medications as ordered.  Be sure to avoid fried foods. Eat more baked foods.     Current Medications: 1)    Lisinopril-hydrochlorothiazide 20-12.5 Mg Tabs (Lisinopril-hydrochlorothiazide) .... Take 1 tablet by mouth once a day for blood pressure 2)    Simvastatin 20 Mg Tabs (Simvastatin) .... Take 1 tab by mouth at bedtime for cholesterol 3)    Ecotrin 325 Mg Tbec (Aspirin) .... Take 1 tablet by mouth once a day 4)    Miralax  Powd (Polyethylene glycol 3350) .... Dissolve one capful in water and drink one glass a day as needed for constipation 5)    Naprosyn 500 Mg Tabs (Naproxen) .... Take 1 tablet by mouth two times a day with food as needed for pain 6)    Fish Oil 1000 Mg Caps (Omega-3 fatty acids) .... Take 2 caps once daily 7)    Ketoconazole 2 % Sham (Ketoconazole) .... Apply to scalp twice per week as directed 8)    Zetia 10 Mg Tabs (Ezetimibe) .... One tablet by mouth daily for cholesterol  If you have any questions, please call. We appreciate being able to work with you.   Sincerely,    Juliet Rude [facility]

## 2011-01-05 NOTE — Letter (Signed)
Summary: [facility]  [facility]   Imported By: Silvio Pate Stanislawscyk 08/10/2010 15:11:45  _____________________________________________________________________  External Attachment:    Type:   Image     Comment:   External Document

## 2011-01-16 ENCOUNTER — Telehealth (INDEPENDENT_AMBULATORY_CARE_PROVIDER_SITE_OTHER): Payer: Self-pay | Admitting: Nurse Practitioner

## 2011-01-24 NOTE — Progress Notes (Signed)
Summary: Chantix refill  Phone Note Call from Patient Call back at [facility] Phone [phone]   Summary of Call: pt would like a refill on chantix... [place] [facility] Initial call taken by: Armenia Shannon,  January 16, 2011 3:19 PM  Follow-up for Phone Call        Removed from med list 04/2010 due to not taking.  Needs new Rx.  Dutch Quint RN  January 16, 2011 3:42 PM   Additional Follow-up for Phone Call Additional follow up Details #1::        i sent refill to the pharmacy - pt was seen in 10/2010 and encouraged cessation of smoking. advise him to go to the pharmacy to check for refill Additional Follow-up by: [name] FNP,  January 17, 2011 8:43 AM    Additional Follow-up for Phone Call Additional follow up Details #2::    Pt. notified.  Dutch Quint RN  January 17, 2011 11:56 AM   New/Updated Medications: CHANTIX STARTING MONTH PAK 0.5 MG X 11 & 1 MG X 42 TABS (VARENICLINE TARTRATE) Take according to the starter packet Prescriptions: CHANTIX STARTING MONTH PAK 0.5 MG X 11 & 1 MG X 42 TABS (VARENICLINE TARTRATE) Take according to the starter packet  #1 month qs x 0   Entered and Authorized by:   [name] FNP   Signed by:   [name] FNP on 01/17/2011   Method used:   Faxed to ...       [facility] (retail)       [address] [place], [place]  [address]       Ph: [phone]       Fax: [phone]   RxID:   [id number]

## 2011-02-05 ENCOUNTER — Encounter (INDEPENDENT_AMBULATORY_CARE_PROVIDER_SITE_OTHER): Payer: Self-pay | Admitting: Nurse Practitioner

## 2011-02-05 ENCOUNTER — Encounter: Payer: Self-pay | Admitting: Nurse Practitioner

## 2011-02-05 LAB — CONVERTED CEMR LAB
Bilirubin Urine: NEGATIVE
Glucose, Urine, Semiquant: NEGATIVE
HIV: NONREACTIVE
Ketones, urine, test strip: NEGATIVE
Microalb, Ur: 0.72 mg/dL (ref 0.00–1.89)
Protein, U semiquant: NEGATIVE
Sex Hormone Binding: 28 nmol/L (ref 13–71)
Testosterone-% Free: 2.2 % (ref 1.6–2.9)
Testosterone: 398.74 ng/dL (ref 250–890)
Urobilinogen, UA: 0.2
pH: 5

## 2011-02-06 ENCOUNTER — Encounter (INDEPENDENT_AMBULATORY_CARE_PROVIDER_SITE_OTHER): Payer: Self-pay | Admitting: Nurse Practitioner

## 2011-02-13 NOTE — Assessment & Plan Note (Signed)
Summary: Erectile Dysfunction   Vital Signs:  Patient profile:   60 year old male Weight:      187.8 pounds Temp:     98.2 degrees F oral Pulse rate:   85 / minute Pulse rhythm:   regular Resp:     20 per minute BP sitting:   166 / 74  (left arm) Cuff size:   large  Vitals Entered By: Levon Hedger (February 05, 2011 11:12 AM)  Primary Care Provider:  Tereso Newcomer PA-C   History of Present Illness:  Pt into the office with c/o sexual dysfunction. Reports that since his last CVA in 01/2010. tesicular and hernia done by Tereso Newcomer   Hypertension History:      He denies headache, chest pain, and palpitations.  pt check his BP at home he reports daily and it is usually < 140/90.        Positive major cardiovascular risk factors include male age 61 years old or older, hyperlipidemia, hypertension, and current tobacco user.        Positive history for target organ damage include prior stroke (or TIA).  Further assessment for target organ damage reveals no history of peripheral vascular disease.    Lipid Management History:      Positive NCEP/ATP III risk factors include male age 56 years old or older, HDL cholesterol less than 40, current tobacco user, hypertension, and prior stroke (or TIA).  Negative NCEP/ATP III risk factors include no peripheral vascular disease and no history of aortic aneurysm.        The patient states that he does not know about the "Therapeutic Lifestyle Change" diet.  The patient does not know about adjunctive measures for cholesterol lowering.  He expresses no side effects from his lipid-lowering medication.  The patient denies any symptoms to suggest myopathy or liver disease.      Habits & Providers  Alcohol-Tobacco-Diet     Alcohol drinks/day: >5     Feels need to cut down: yes     Feels annoyed by complaints: no     Feels guilty re: drinking: no     Needs 'eye opener' in am: no     Tobacco Status: current     Tobacco Counseling: to quit use of  tobacco products     Cigarette Packs/Day: 1.0     Year Started: 1969     Pack years: 18  Exercise-Depression-Behavior     Does Patient Exercise: no     STD Risk: never     Drug Use: past  Allergies: No Known Drug Allergies  Review of Systems General:  Denies fever. CV:  Denies chest pain or discomfort. Resp:  Denies cough. GI:  Denies abdominal pain, nausea, and vomiting. GU:  Complains of erectile dysfunction.  Physical Exam  General:  alert.   Head:  normocephalic.   Lungs:  normal breath sounds.   Heart:  normal rate and regular rhythm.   Abdomen:  normal bowel sounds.   Msk:  up to the exam table Neurologic:  alert & oriented X3.   Skin:  color normal.   Psych:  Oriented X3.     Impression & Recommendations:  Problem # 1:  ESSENTIAL HYPERTENSION, BENIGN (ICD-401.1) BP slightly elevated today advised pt to make sure the takes meds daily His updated medication list for this problem includes:    Lisinopril-hydrochlorothiazide 20-12.5 Mg Tabs (Lisinopril-hydrochlorothiazide) .Marland Kitchen... Take 1 tablet by mouth once a day for blood pressure  Orders: T-Urine Microalbumin  w/creat. ratio [id number]) UA Dipstick w/o Micro (manual) ([id number])  Problem # 2:  HYPERLIPIDEMIA (ICD-272.4) ok when checked on last month His updated medication list for this problem includes:    Simvastatin 20 Mg Tabs (Simvastatin) .Marland Kitchen... Take 1 tab by mouth at bedtime for cholesterol    Zetia 10 Mg Tabs (Ezetimibe) ..... One tablet by mouth daily for cholesterol  Problem # 3:  ERECTILE DYSFUNCTION, ORGANIC (ICD-607.84) handout given reviewed with pt the possible causes Orders: T-Testosterone; Total [phone]) T-PSA [phone]) T-HIV Antibody  (Reflex) [id number]) T-Syphilis Test (RPR) [phone])  Problem # 4:  TOBACCO ABUSE (ICD-305.1) advised pt to try to stop smoking His updated medication list for this problem includes:    Chantix Starting Month Pak 0.5 Mg X 11 & 1 Mg X 42  Tabs (Varenicline tartrate) .Marland Kitchen... Take according to the starter packet  Complete Medication List: 1)  Lisinopril-hydrochlorothiazide 20-12.5 Mg Tabs (Lisinopril-hydrochlorothiazide) .... Take 1 tablet by mouth once a day for blood pressure 2)  Simvastatin 20 Mg Tabs (Simvastatin) .... Take 1 tab by mouth at bedtime for cholesterol 3)  Ecotrin 325 Mg Tbec (Aspirin) .... Take 1 tablet by mouth once a day 4)  Miralax Powd (Polyethylene glycol 3350) .... Dissolve one capful in water and drink one glass a day as needed for constipation 5)  Naprosyn 500 Mg Tabs (Naproxen) .... Take 1 tablet by mouth two times a day with food as needed for pain 6)  Fish Oil 1000 Mg Caps (Omega-3 fatty acids) .... Take 2 caps once daily 7)  Ketoconazole 2 % Sham (Ketoconazole) .... Apply to scalp twice per week as directed 8)  Zetia 10 Mg Tabs (Ezetimibe) .... One tablet by mouth daily for cholesterol 9)  Chantix Starting Month Pak 0.5 Mg X 11 & 1 Mg X 42 Tabs (Varenicline tartrate) .... Take according to the starter packet  Hypertension Assessment/Plan:      The patient's hypertensive risk group is category C: Target organ damage and/or diabetes.  His calculated 10 year risk of coronary heart disease is 22 %.  Today's blood pressure is 166/74.  His blood pressure goal is < 140/90.  Lipid Assessment/Plan:      Based on NCEP/ATP III, the patient's risk factor category is "history of coronary disease, peripheral vascular disease, cerebrovascular disease, or aortic aneurysm along with either diabetes, current smoker, or LDL > 130 plus HDL < 40 plus triglycerides > 200".  The patient's lipid goals are as follows: Total cholesterol goal is 200; LDL cholesterol goal is 70; HDL cholesterol goal is 40; Triglyceride goal is 150.    Patient Instructions: 1)  Your labs will be checked today.  You will be notified of the results. 2)  Blood pressure is high today.  Be sure you are taking your medications daily. it is very important  given your history of stroke that you keep your blood pressure normal. 3)  Cholesterol - ok when check last month. Keep taking cholesterol medication 4)  Follow up at least every 3 months for high blood pressure.   Orders Added: 1)  Est. Patient Level IV [[address]347] 2)  T-Testosterone; Total [[id number]] 3)  T-PSA [[id number]] 4)  T-HIV Antibody  (Reflex) [[id number]] 5)  T-Syphilis Test (RPR) [[id number]] 6)  T-Urine Microalbumin w/creat. ratio [[id number]] 7)  UA Dipstick w/o Micro (manual) [81002]    Laboratory Results   Urine Tests  Date/Time Received: February 05, 2011 11:12 AM   Routine Urinalysis   Color: lt. yellow Appearance: Clear  Glucose: negative   (Normal Range: Negative) Bilirubin: negative   (Normal Range: Negative) Ketone: negative   (Normal Range: Negative) Spec. Gravity: >=1.030   (Normal Range: 1.003-1.035) Blood: negative   (Normal Range: Negative) pH: 5.0   (Normal Range: 5.0-8.0) Protein: negative   (Normal Range: Negative) Urobilinogen: 0.2   (Normal Range: 0-1) Nitrite: negative   (Normal Range: Negative) Leukocyte Esterace: negative   (Normal Range: Negative)

## 2011-02-13 NOTE — Letter (Signed)
Summary: Handout Printed  Printed Handout:  - ED (Erectile Dysfunction) 

## 2011-02-13 NOTE — Letter (Signed)
Summary: *HSN Results Follow up  [facility]  [address] [place], [place] [address]   Phone: [phone]  Fax: [phone]      02/06/2011   James Riley [address] [place], [place]  [address]   Dear  [name],                            ____S.Drinkard,FNP   ____D. Gore,FNP       ____B. McPherson,MD   ____V. Rankins,MD    ____E. Mulberry,MD    __X__N. Daphine Deutscher, FNP  ____D. Reche Dixon, MD    ____K. Philipp Deputy, MD    ____Other     This letter is to inform you that your recent test(s):  _______Pap Smear    ___X____Lab Test     _______X-ray    ___X____ is within acceptable limits  _______ requires a medication change  _______ requires a follow-up lab visit  _______ requires a follow-up visit with your provider   Comments: Labs done during recent office visit are normal.       _________________________________________________________ If you have any questions, please contact our office [phone].                    Sincerely,    [name] FNP [facility]

## 2011-02-16 LAB — B. BURGDORFI ANTIBODIES: B burgdorferi Ab IgG+IgM: 0.24 {ISR}

## 2011-02-22 LAB — DIFFERENTIAL
Basophils Absolute: 0 10*3/uL (ref 0.0–0.1)
Basophils Relative: 0 % (ref 0–1)
Basophils Relative: 1 % (ref 0–1)
Eosinophils Absolute: 0.2 10*3/uL (ref 0.0–0.7)
Eosinophils Absolute: 0.2 10*3/uL (ref 0.0–0.7)
Eosinophils Relative: 2 % (ref 0–5)
Eosinophils Relative: 3 % (ref 0–5)
Lymphs Abs: 2.9 10*3/uL (ref 0.7–4.0)
Monocytes Absolute: 0.5 10*3/uL (ref 0.1–1.0)
Monocytes Relative: 6 % (ref 3–12)
Monocytes Relative: 8 % (ref 3–12)
Neutro Abs: 4.3 10*3/uL (ref 1.7–7.7)

## 2011-02-22 LAB — CBC
HCT: 40.1 % (ref 39.0–52.0)
HCT: 45.4 % (ref 39.0–52.0)
Hemoglobin: 13.9 g/dL (ref 13.0–17.0)
Hemoglobin: 14.3 g/dL (ref 13.0–17.0)
Hemoglobin: 14.8 g/dL (ref 13.0–17.0)
Hemoglobin: 15.8 g/dL (ref 13.0–17.0)
MCHC: 34.6 g/dL (ref 30.0–36.0)
MCHC: 34.8 g/dL (ref 30.0–36.0)
MCHC: 35.2 g/dL (ref 30.0–36.0)
MCV: 101.6 fL — ABNORMAL HIGH (ref 78.0–100.0)
RBC: 3.95 MIL/uL — ABNORMAL LOW (ref 4.22–5.81)
RBC: 4.22 MIL/uL (ref 4.22–5.81)
RBC: 4.49 MIL/uL (ref 4.22–5.81)
RDW: 12.5 % (ref 11.5–15.5)
RDW: 12.8 % (ref 11.5–15.5)
WBC: 7.1 10*3/uL (ref 4.0–10.5)

## 2011-02-22 LAB — BASIC METABOLIC PANEL
CO2: 22 mEq/L (ref 19–32)
CO2: 27 mEq/L (ref 19–32)
Calcium: 9.1 mg/dL (ref 8.4–10.5)
Calcium: 9.2 mg/dL (ref 8.4–10.5)
Calcium: 9.3 mg/dL (ref 8.4–10.5)
Creatinine, Ser: 0.79 mg/dL (ref 0.4–1.5)
Creatinine, Ser: 0.89 mg/dL (ref 0.4–1.5)
Creatinine, Ser: 0.99 mg/dL (ref 0.4–1.5)
GFR calc Af Amer: >60 mL/min (ref >60)
GFR calc Af Amer: >60 mL/min (ref >60)
GFR calc Af Amer: >60 mL/min (ref >60)
GFR calc non Af Amer: >60 mL/min (ref >60)
GFR calc non Af Amer: >60 mL/min (ref >60)
GFR calc non Af Amer: >60 mL/min (ref >60)
Glucose, Bld: 116 mg/dL — ABNORMAL HIGH (ref 70–99)
Sodium: 135 mEq/L (ref 135–145)
Sodium: 136 mEq/L (ref 135–145)
Sodium: 139 mEq/L (ref 135–145)

## 2011-02-22 LAB — CARDIAC PANEL(CRET KIN+CKTOT+MB+TROPI)
CK, MB: 0.4 ng/mL (ref 0.3–4.0)
Relative Index: INVALID (ref 0.0–2.5)
Relative Index: INVALID (ref 0.0–2.5)
Total CK: 50 U/L (ref 7–232)
Total CK: 63 U/L (ref 7–232)
Troponin I: 0.01 ng/mL (ref 0.00–0.06)

## 2011-02-22 LAB — COMPREHENSIVE METABOLIC PANEL
ALT: 32 U/L (ref 0–53)
ALT: 42 U/L (ref 0–53)
AST: 30 U/L (ref 0–37)
AST: 42 U/L — ABNORMAL HIGH (ref 0–37)
Albumin: 4.2 g/dL (ref 3.5–5.2)
Alkaline Phosphatase: 61 U/L (ref 39–117)
Alkaline Phosphatase: 76 U/L (ref 39–117)
BUN: 12 mg/dL (ref 6–23)
CO2: 24 mEq/L (ref 19–32)
CO2: 25 mEq/L (ref 19–32)
Calcium: 9 mg/dL (ref 8.4–10.5)
Calcium: 9.7 mg/dL (ref 8.4–10.5)
Chloride: 102 mEq/L (ref 96–112)
Chloride: 106 mEq/L (ref 96–112)
Creatinine, Ser: 0.86 mg/dL (ref 0.4–1.5)
GFR calc Af Amer: >60 mL/min (ref >60)
GFR calc non Af Amer: >60 mL/min (ref >60)
GFR calc non Af Amer: >60 mL/min (ref >60)
Glucose, Bld: 105 mg/dL — ABNORMAL HIGH (ref 70–99)
Glucose, Bld: 96 mg/dL (ref 70–99)
Potassium: 3.4 mEq/L — ABNORMAL LOW (ref 3.5–5.1)
Potassium: 4.4 mEq/L (ref 3.5–5.1)
Sodium: 136 mEq/L (ref 135–145)
Sodium: 137 mEq/L (ref 135–145)
Total Bilirubin: 0.3 mg/dL (ref 0.3–1.2)
Total Bilirubin: 0.3 mg/dL (ref 0.3–1.2)
Total Protein: 7.8 g/dL (ref 6.0–8.3)

## 2011-02-22 LAB — RAPID URINE DRUG SCREEN, HOSP PERFORMED
Barbiturates: NOT DETECTED
Benzodiazepines: NOT DETECTED

## 2011-02-22 LAB — MAGNESIUM: Magnesium: 2.3 mg/dL (ref 1.5–2.5)

## 2011-02-22 LAB — IRON AND TIBC
Iron: 86 ug/dL (ref 42–135)
Saturation Ratios: 30 % (ref 20–55)
UIBC: 200 ug/dL

## 2011-02-22 LAB — TROPONIN I: Troponin I: 0.01 ng/mL (ref 0.00–0.06)

## 2011-02-22 LAB — CK TOTAL AND CKMB (NOT AT ARMC)
CK, MB: 0.4 ng/mL (ref 0.3–4.0)
CK, MB: 0.5 ng/mL (ref 0.3–4.0)
Total CK: 54 U/L (ref 7–232)
Total CK: 60 U/L (ref 7–232)

## 2011-02-22 LAB — APTT: aPTT: 27 seconds (ref 24–37)

## 2011-02-22 LAB — LIPID PANEL
Cholesterol: 174 mg/dL (ref 0–200)
LDL Cholesterol: 86 mg/dL (ref 0–99)

## 2011-02-22 LAB — RETICULOCYTES: RBC.: 4.07 MIL/uL — ABNORMAL LOW (ref 4.22–5.81)

## 2011-02-22 LAB — HEMOGLOBIN A1C: Hgb A1c MFr Bld: 5.8 % (ref 4.6–6.1)

## 2011-02-22 LAB — CK: Total CK: 89 U/L (ref 7–232)

## 2011-02-22 LAB — TSH: TSH: 2.841 u[IU]/mL (ref 0.350–4.500)

## 2011-08-02 ENCOUNTER — Emergency Department (HOSPITAL_COMMUNITY)
Admission: EM | Admit: 2011-08-02 | Discharge: 2011-08-02 | Disposition: A | Discharge status: Home / Self Care | Payer: Self-pay | Attending: Emergency Medicine | Admitting: Emergency Medicine

## 2011-08-02 DIAGNOSIS — I10 Essential (primary) hypertension: Secondary | ICD-10-CM | POA: Insufficient documentation

## 2011-08-02 DIAGNOSIS — Z79899 Other long term (current) drug therapy: Secondary | ICD-10-CM | POA: Insufficient documentation

## 2011-08-02 DIAGNOSIS — R1909 Other intra-abdominal and pelvic swelling, mass and lump: Secondary | ICD-10-CM | POA: Insufficient documentation

## 2011-08-02 DIAGNOSIS — I889 Nonspecific lymphadenitis, unspecified: Secondary | ICD-10-CM | POA: Insufficient documentation

## 2011-08-02 DIAGNOSIS — R109 Unspecified abdominal pain: Secondary | ICD-10-CM | POA: Insufficient documentation

## 2011-08-02 DIAGNOSIS — E7800 Pure hypercholesterolemia, unspecified: Secondary | ICD-10-CM | POA: Insufficient documentation

## 2012-03-04 ENCOUNTER — Emergency Department (HOSPITAL_COMMUNITY)
Admission: EM | Admit: 2012-03-04 | Discharge: 2012-03-04 | Disposition: A | Discharge status: Home / Self Care | Payer: Self-pay | Attending: Emergency Medicine | Admitting: Emergency Medicine

## 2012-03-04 ENCOUNTER — Encounter (HOSPITAL_COMMUNITY): Payer: Self-pay | Admitting: *Deleted

## 2012-03-04 DIAGNOSIS — I10 Essential (primary) hypertension: Secondary | ICD-10-CM | POA: Insufficient documentation

## 2012-03-04 DIAGNOSIS — B356 Tinea cruris: Secondary | ICD-10-CM | POA: Insufficient documentation

## 2012-03-04 DIAGNOSIS — R109 Unspecified abdominal pain: Secondary | ICD-10-CM | POA: Insufficient documentation

## 2012-03-04 DIAGNOSIS — E7800 Pure hypercholesterolemia, unspecified: Secondary | ICD-10-CM | POA: Insufficient documentation

## 2012-03-04 HISTORY — DX: Pure hypercholesterolemia, unspecified: E78.00

## 2012-03-04 HISTORY — DX: Essential (primary) hypertension: I10

## 2012-03-04 MED ORDER — HYDROCODONE-ACETAMINOPHEN 5-325 MG PO TABS: 1.0000 | ORAL_TABLET | ORAL | Status: DC | PRN

## 2012-03-04 MED ORDER — CLOTRIMAZOLE 1 % EX CREA: TOPICAL_CREAM | CUTANEOUS | Status: DC

## 2012-03-04 MED ORDER — NYSTATIN 100000 UNIT/GM EX POWD
Freq: Once | CUTANEOUS | Status: AC
  Administered 2012-03-04: 06:00:00 via TOPICAL
  Filled 2012-03-04: qty 15

## 2012-03-04 NOTE — ED Notes (Signed)
unintact skin R groin pt reports constant moisture in area denies drainage from 2 open areas less than 1 cm each to same area 2 areas < 1 cm abraided skin also noted

## 2012-03-04 NOTE — ED Notes (Signed)
Pt has a painful area in his right groin.  This has been going on for 3 months or so.  Pt has drainage in his right groin and it almost appears like a yeast infection in this skin fold.  There are 3 small "hole like" open areas in the skin fold.

## 2012-03-04 NOTE — Discharge Instructions (Signed)
Keep groin area dry.  Put on socks prior to putting on underwear.  Use topical antifungal as prescribed.  Follow up with [facility] or with the surgery clinic if the open areas of skin do not improve after 7-10 days of topical treatment.  Jock Itch Jock itch is a germ infection of the groin and upper thighs. The type of germ that causes jock itch is a fungus. It is itchy and often feels like it is burning. It is common in people who play sports. Sweating and wearing certain athletic gear can cause this type of rash. HOME CARE  Take your medicines as told. Finish them even if you start to feel better.   Wear loose-fitting clothing.   Men should wear cotton boxer shorts.   Women should wear cotton underwear.   Avoid hot baths.   Dry the groin area well after bathing.  GET HELP RIGHT AWAY IF:   Your rash is worse or spreading.   Your rash returns after treatment is finished.   Your rash is not gone in 4 weeks.   The area becomes red, warm, tender, and puffy (swollen).   You have a fever.  MAKE SURE YOU:  Understand these instructions.   Will watch your condition.   Will get help right away if you are not doing well or get worse.  Document Released: 02/13/2010 Document Revised: 11/08/2011 Document Reviewed: 02/13/2010 [facility] Patient Information 2012 [place], [place].

## 2012-03-04 NOTE — ED Provider Notes (Cosign Needed)
History     CSN: [id number]  Arrival date & time 03/04/12  4098   First MD Initiated Contact with Patient 03/04/12 0435      Chief Complaint  Patient presents with  . Groin Pain    (Consider location/radiation/quality/duration/timing/severity/associated sxs/prior treatment) HPI 61 year old male presents to emergency department complaining of right-sided groin pain ongoing for the last 3-5 months. Patient reports he's been seen in the ER and given pain medicine, and has also been seen by his primary care doctor and started on penicillin. Patient reports this area constantly is draining and weeping and wet. He reports his skin is very raw in this area. He denies any swelling or bulges in the groin area. He denies any fever or redness. Nothing that he has tried has helped no penile lesions. No pain in the scrotum. No nausea vomiting or diarrhea. No rash elsewhere on his body Past Medical History  Diagnosis Date  . Hypertension   . Hypercholesterolemia     History reviewed. No pertinent past surgical history.  No family history on file.  History  Substance Use Topics  . Smoking status: Current Everyday Smoker -- 2.0 packs/day    Types: Cigarettes  . Smokeless tobacco: Not on file  . Alcohol Use:       Review of Systems  All other systems reviewed and are negative.   other than noted in the history of present illness  Allergies  Review of patient's allergies indicates no known allergies.  Home Medications   Current Outpatient Rx  Name Route Sig Dispense Refill  . CLOTRIMAZOLE 1 % EX CREA  Apply to affected area 2 times daily 15 g 0    BP 148/88  Pulse 78  Temp(Src) 98.1 F (36.7 C) (Oral)  Resp 18  SpO2 97%  Physical Exam  Nursing note and vitals reviewed. Constitutional: He is oriented to person, place, and time. He appears well-developed and well-nourished.  HENT:  Head: Normocephalic and atraumatic.  Nose: Nose normal.  Mouth/Throat: Oropharynx is clear  and moist.  Eyes: Conjunctivae and EOM are normal. Pupils are equal, round, and reactive to light.  Neck: Normal range of motion. Neck supple. No JVD present. No tracheal deviation present. No thyromegaly present.  Cardiovascular: Normal rate, regular rhythm, normal heart sounds and intact distal pulses.  Exam reveals no gallop and no friction rub.   No murmur heard. Pulmonary/Chest: Effort normal and breath sounds normal. No stridor. No respiratory distress. He has no wheezes. He has no rales. He exhibits no tenderness.  Abdominal: Soft. Bowel sounds are normal. He exhibits no distension and no mass. There is no tenderness. There is no rebound and no guarding.  Genitourinary: Penis normal. No penile tenderness.       Patient with areas of maceration and skin breakdown in his right groin area. Patient with several small punctate ulcerations at the base of hair follicles. No active drainage noted from these areas. Skin changes around the hair follicles appear chronic.  Musculoskeletal: Normal range of motion. He exhibits no edema and no tenderness.  Lymphadenopathy:    He has no cervical adenopathy.  Neurological: He is oriented to person, place, and time. He has normal reflexes. No cranial nerve deficit. He exhibits normal muscle tone. Coordination normal.  Skin: Skin is dry. No rash noted. No erythema. No pallor.  Psychiatric: He has a normal mood and affect. His behavior is normal. Judgment and thought content normal.    ED Course  Procedures (including critical care  time)  Labs Reviewed - No data to display No results found.   1. Tinea cruris       MDM  61 year old male with rash and pain to his right groin. Area is quite wet and has appearance of chronic ongoing tinea curious. Patient may have underlying hidradenitis, but no active lesions at this time. Will treat with Lotrimin cream and have [facility] or [facility] if he should have worsening swelling or  pain        Olivia Mackie, MD 03/04/12 [phone]

## 2012-09-09 ENCOUNTER — Encounter (HOSPITAL_COMMUNITY): Payer: Self-pay | Admitting: *Deleted

## 2012-09-09 ENCOUNTER — Emergency Department (HOSPITAL_COMMUNITY)
Admission: EM | Admit: 2012-09-09 | Discharge: 2012-09-09 | Disposition: A | Discharge status: Home / Self Care | Payer: Medicare Other | Attending: Emergency Medicine | Admitting: Emergency Medicine

## 2012-09-09 DIAGNOSIS — L03319 Cellulitis of trunk, unspecified: Secondary | ICD-10-CM | POA: Diagnosis not present

## 2012-09-09 DIAGNOSIS — F17200 Nicotine dependence, unspecified, uncomplicated: Secondary | ICD-10-CM | POA: Diagnosis not present

## 2012-09-09 DIAGNOSIS — L02818 Cutaneous abscess of other sites: Secondary | ICD-10-CM | POA: Diagnosis not present

## 2012-09-09 DIAGNOSIS — I10 Essential (primary) hypertension: Secondary | ICD-10-CM | POA: Insufficient documentation

## 2012-09-09 DIAGNOSIS — L02219 Cutaneous abscess of trunk, unspecified: Secondary | ICD-10-CM | POA: Diagnosis not present

## 2012-09-09 DIAGNOSIS — E7800 Pure hypercholesterolemia, unspecified: Secondary | ICD-10-CM | POA: Diagnosis not present

## 2012-09-09 DIAGNOSIS — L03818 Cellulitis of other sites: Secondary | ICD-10-CM | POA: Diagnosis not present

## 2012-09-09 DIAGNOSIS — L03314 Cellulitis of groin: Secondary | ICD-10-CM

## 2012-09-09 DIAGNOSIS — L02811 Cutaneous abscess of head [any part, except face]: Secondary | ICD-10-CM

## 2012-09-09 MED ORDER — DOXYCYCLINE HYCLATE 100 MG PO CAPS: 100.0000 mg | ORAL_CAPSULE | Freq: Two times a day (BID) | ORAL | Status: DC

## 2012-09-09 MED ORDER — DOXYCYCLINE HYCLATE 100 MG PO TABS
100.0000 mg | ORAL_TABLET | Freq: Once | ORAL | Status: AC
  Administered 2012-09-09: 100 mg via ORAL
  Filled 2012-09-09: qty 1

## 2012-09-09 MED ORDER — HYDROCODONE-ACETAMINOPHEN 5-500 MG PO TABS: 1.0000 | ORAL_TABLET | Freq: Four times a day (QID) | ORAL | Status: DC | PRN

## 2012-09-09 NOTE — ED Provider Notes (Signed)
History     CSN: [id number]  Arrival date & time 09/09/12  1478   First MD Initiated Contact with Patient 09/09/12 [phone]      Chief Complaint  Patient presents with  . Groin Pain    (Consider location/radiation/quality/duration/timing/severity/associated sxs/prior treatment) HPI  Pt presents to the ED with "stuff draining from his head" and groin pain. He says that he was treated a couple of months ago for the same in his groin but the "cream that they gave me didn't help at all". He admits that it has gotten mildly worse. He also says that a month ago he had some "white stuff draining from the side of his leg". He endorses that its gotten better and hasnt drained recently. He denies having any pain when he urinates or flank pains. He denies having weakness, nausea, vomiting, diarrhea. NAD/VSS  Past Medical History  Diagnosis Date  . Hypertension   . Hypercholesterolemia     History reviewed. No pertinent past surgical history.  No family history on file.  History  Substance Use Topics  . Smoking status: Current Every Day Smoker -- 2.0 packs/day    Types: Cigarettes  . Smokeless tobacco: Not on file  . Alcohol Use:       Review of Systems  Review of Systems  Gen: no weight loss, fevers, chills, night sweats  Eyes: no discharge or drainage, no occular pain or visual changes  Nose: no epistaxis or rhinorrhea  Mouth: no dental pain, no sore throat  Neck: no neck pain  Lungs:No wheezing, coughing or hemoptysis CV: no chest pain, palpitations, dependent edema or orthopnea  Abd: no abdominal pain, nausea, vomiting  GU: no dysuria or gross hematuria  MSK:  No abnormalities  Neuro: no headache, no focal neurologic deficits  Skin: rash to head and groin Psyche: negative.   Allergies  Review of patient's allergies indicates no known allergies.  Home Medications   Current Outpatient Rx  Name Route Sig Dispense Refill  . HYDROCHLOROTHIAZIDE 25 MG PO TABS Oral Take  25 mg by mouth daily.    Marland Kitchen SIMVASTATIN PO Oral Take by mouth.    . DOXYCYCLINE HYCLATE 100 MG PO CAPS Oral Take 1 capsule (100 mg total) by mouth 2 (two) times daily. 20 capsule 0  . DOXYCYCLINE HYCLATE 100 MG PO CAPS Oral Take 1 capsule (100 mg total) by mouth 2 (two) times daily. 20 capsule 0  . HYDROCODONE-ACETAMINOPHEN 5-500 MG PO TABS Oral Take 1-2 tablets by mouth every 6 (six) hours as needed for pain. 10 tablet 0    BP 158/82  Pulse 71  Temp 97.8 F (36.6 C) (Oral)  Resp 24  SpO2 98%  Physical Exam  Nursing note and vitals reviewed. Constitutional: He appears well-developed and well-nourished. No distress.  HENT:  Head: Normocephalic and atraumatic.  Eyes: Pupils are equal, round, and reactive to light.  Neck: Normal range of motion. Neck supple.  Cardiovascular: Normal rate and regular rhythm.   Pulmonary/Chest: Effort normal.  Abdominal: Soft.  Neurological: He is alert.  Skin: Skin is warm and dry.       ED Course  Procedures (including critical care time)   Labs Reviewed  URINALYSIS, ROUTINE W REFLEX MICROSCOPIC   No results found.   1. Abscess of scalp   2. Cellulitis of groin, right       MDM  Pt failed fungal treatments. With abscesses on his head and rash with no excoriations or scaling on inner thigh, I  believe this to be cellulitis. Will treat with Doxycyline. He has PCP follow-up first week of November. Will give a derm referral. Abscesses to head are open and draining, does not require I&D.  Pt has been advised of the symptoms that warrant their return to the ED. Patient has voiced understanding and has agreed to follow-up with the PCP or specialist.         Dorthula Matas, PA 09/09/12 [phone]

## 2012-09-09 NOTE — ED Notes (Signed)
Patient reports he has been seen and treated for pain in his groin on the right side.  Patient has noted redness, rash like appearance, and some firmness in the tissue.  Similar area noted on the testicle as well.  Patient denies any diff voiding.

## 2012-09-10 NOTE — ED Provider Notes (Signed)
Medical screening examination/treatment/procedure(s) were performed by non-physician practitioner and as supervising physician I was immediately available for consultation/collaboration.   Lyanne Co, MD 09/10/12 [phone]

## 2012-09-17 ENCOUNTER — Encounter (HOSPITAL_COMMUNITY): Payer: Self-pay | Admitting: *Deleted

## 2012-09-17 ENCOUNTER — Emergency Department (INDEPENDENT_AMBULATORY_CARE_PROVIDER_SITE_OTHER)
Admission: EM | Admit: 2012-09-17 | Discharge: 2012-09-17 | Disposition: A | Discharge status: Home / Self Care | Payer: Medicare Other | Source: Home / Self Care | Attending: Emergency Medicine | Admitting: Emergency Medicine

## 2012-09-17 DIAGNOSIS — I10 Essential (primary) hypertension: Secondary | ICD-10-CM

## 2012-09-17 DIAGNOSIS — L0291 Cutaneous abscess, unspecified: Secondary | ICD-10-CM

## 2012-09-17 DIAGNOSIS — L0390 Cellulitis, unspecified: Secondary | ICD-10-CM | POA: Diagnosis not present

## 2012-09-17 MED ORDER — HYDROCHLOROTHIAZIDE 25 MG PO TABS: 25.0000 mg | ORAL_TABLET | Freq: Every day | ORAL | Status: DC

## 2012-09-17 MED ORDER — TRAMADOL HCL 50 MG PO TABS: 100.0000 mg | ORAL_TABLET | Freq: Three times a day (TID) | ORAL | Status: DC | PRN

## 2012-09-17 MED ORDER — CLINDAMYCIN HCL 300 MG PO CAPS: 300.0000 mg | ORAL_CAPSULE | Freq: Four times a day (QID) | ORAL | Status: DC

## 2012-09-17 NOTE — ED Notes (Signed)
Pt  Was  Seen  Last  Week in  Er  For  Cellulitis  Of  Groin       Was  Placed  On  Doxy   Pt  Here  Today  For  A  Recheck           [facility]  [facility]  [facility]   And      Will  Have  A  pcp  In nov  According to records  Of prior  Visit   He  Also  Has  A  History  Of  htn in past

## 2012-09-17 NOTE — ED Provider Notes (Signed)
Chief Complaint  Patient presents with  . Recurrent Skin Infections    History of Present Illness:    Mr. Bogus has had a one-year history of a chronic, draining abscess in his right groin. He's been back and forth to the emergency department and [facility] about 4 times with this and has been treated with multiple different antibiotics but none seem to do much good. It continues to drain, aches, and hurt. He was at the emergency department we feel was given doxycycline, but this didn't seem to do much good. He also has multiple nodules in his scalp which occasionally become infected. He denies any fever or chills.  He also has high blood pressure but because [facility], he's been unable to get his medicines filled. He plans to see another primary care Dr. sometime in November. He denies any headaches, dizziness, blurry vision, shortness of breath, or chest pain.  Review of Systems:  Other than noted above, the patient denies any of the following symptoms: Systemic:  No fever, chills or sweats. Skin:  No rash or itching.  PMFSH:  Past medical history, family history, social history, meds, and allergies were reviewed.  No history of diabetes or prior history of abscesses or MRSA.  Physical Exam:   Vital signs:  BP 177/74  Pulse 70  Temp 97.7 F (36.5 C) (Oral)  Resp 16  SpO2 100% Skin:  He has an elongated, inflamed mass in his right groin that has multiple areas of drainage. The drainage is very malodorous. This is tender to touch. His skin was otherwise clear with exception of her scalp which shows multiple, small inflammatory nodules.  Skin exam was otherwise normal.  No rash. Ext:  Distal pulses were full, patient has full ROM of all joints.  Other Labs Obtained at Urgent Care Center:  A culture of the drainage was obtained.  Results are pending at this time and we will call about any positive results.  Assessment:  The primary encounter  diagnosis was Abscess. A diagnosis of Essential hypertension was also pertinent to this visit.  I think this chronically infected abscess in his groin represents hidradenitis suppurativa, and he also appears to have dissecting cellulitis of the scalp, which are two of the follicular plugging triad of the skin diseases. I don't think that antibiotics are the final answer to this and I believe he would need to have this area surgically excised. I have referred him to a surgeon, Dr. Avel Peace.  Plan:   1.  The following meds were prescribed:   New Prescriptions   CLINDAMYCIN (CLEOCIN) 300 MG CAPSULE    Take 1 capsule (300 mg total) by mouth 4 (four) times daily.   HYDROCHLOROTHIAZIDE (HYDRODIURIL) 25 MG TABLET    Take 1 tablet (25 mg total) by mouth daily.   TRAMADOL (ULTRAM) 50 MG TABLET    Take 2 tablets (100 mg total) by mouth every 8 (eight) hours as needed for pain.   2.  The patient was instructed in symptomatic care and handouts were given.  Follow up:  The patient was told to follow up with Dr. Avel Peace.    Reuben Likes, MD 09/17/12 [phone]

## 2012-09-18 ENCOUNTER — Encounter (INDEPENDENT_AMBULATORY_CARE_PROVIDER_SITE_OTHER): Payer: Self-pay | Admitting: Surgery

## 2012-09-18 ENCOUNTER — Ambulatory Visit (INDEPENDENT_AMBULATORY_CARE_PROVIDER_SITE_OTHER): Payer: Medicare Other | Admitting: Surgery

## 2012-09-18 VITALS — BP 142/80 | HR 69 | Temp 98.0°F | Resp 18 | Ht 70.0 in | Wt 179.6 lb

## 2012-09-18 DIAGNOSIS — L02219 Cutaneous abscess of trunk, unspecified: Secondary | ICD-10-CM

## 2012-09-18 DIAGNOSIS — L02214 Cutaneous abscess of groin: Secondary | ICD-10-CM | POA: Insufficient documentation

## 2012-09-18 DIAGNOSIS — L03319 Cellulitis of trunk, unspecified: Secondary | ICD-10-CM | POA: Diagnosis not present

## 2012-09-18 NOTE — Patient Instructions (Signed)
1.  Soak in a bath tub 3 times per day.  2.  Clean with soap and water.  You may also use peroxide.  3.  Vicodin for pain.

## 2012-09-18 NOTE — Progress Notes (Signed)
[facility]  James Kin, MD,  FACS [address] [place]  [address] [place], [place]    [address] Phone:  [phone] FAX:  [phone]   Re:   James Riley DOB:   20-Apr-1951 MRN:   [id number]  Urgent Office  ASSESSMENT AND PLAN: 1.  Right groin abscess  Going on over a year, but this is the first time that he has seen [place].  I did an I&D in the office.  He will do sitz baths TID.  I gave #20 Vicodin.  I will see him back in one week.  2.  Disability since 2011 because of stroke 3.  History of stroke  But no residual now 4.  Smokes  Knows it is bad for his health 5.  HTN  HISTORY OF PRESENT ILLNESS: Chief Complaint  Patient presents with  . New Evaluation    abscess inner thiegh    James Riley is a 61 y.o. (DOB: 23-Apr-1951)  AA male who is a patient of [facility], [facility] and comes to me today for right groin abscess.  He was seen a [facility], but they have closed their doors.  He has gone to the emergency room several times in the last year with recurrent right groin infection. He has not visit in our office yet.  He was seen by Dr. Leslee Home in the [facility] urgent center beside the emergency room yesterday and referred to our office.  Current Outpatient Prescriptions  Medication Sig Dispense Refill  . clindamycin (CLEOCIN) 300 MG capsule Take 1 capsule (300 mg total) by mouth 4 (four) times daily.  40 capsule  0  . doxycycline (VIBRAMYCIN) 100 MG capsule Take 1 capsule (100 mg total) by mouth 2 (two) times daily.  20 capsule  0  . hydrochlorothiazide (HYDRODIURIL) 25 MG tablet Take 1 tablet (25 mg total) by mouth daily.  90 tablet  0  . HYDROcodone-acetaminophen (VICODIN) 5-500 MG per tablet Take 1-2 tablets by mouth every 6 (six) hours as needed for pain.  10 tablet  0  . lisinopril-hydrochlorothiazide (PRINZIDE,ZESTORETIC) 20-12.5 MG per tablet Take 1 tablet by mouth daily.      . naproxen (NAPROSYN) 500 MG tablet Take 500 mg by mouth 2  (two) times daily with a meal.      . SIMVASTATIN PO Take by mouth.      . traMADol (ULTRAM) 50 MG tablet Take 2 tablets (100 mg total) by mouth every 8 (eight) hours as needed for pain.  30 tablet  0  . traZODone (DESYREL) 50 MG tablet Take 50 mg by mouth at bedtime.      Marland Kitchen doxycycline (VIBRAMYCIN) 100 MG capsule Take 1 capsule (100 mg total) by mouth 2 (two) times daily.  20 capsule  0  . hydrochlorothiazide (HYDRODIURIL) 25 MG tablet Take 25 mg by mouth daily.       Social History: Divorced. Has 2 children in their 30's  PHYSICAL EXAM: BP 142/80  Pulse 69  Temp 98 F (36.7 C) (Oral)  Resp 18  Ht 5\' 10"  (1.778 m)  Wt 179 lb 9.6 oz (81.466 kg)  BMI 25.77 kg/m2  General: AA M who is alert and generally healthy appearing.  HEENT: Normal. Pupils equal.  Neck: Supple. No mass.  No thyroid mass.   Lymph Nodes:  No supraclavicular or cervical nodes. Lungs: Clear to auscultation and symmetric breath sounds. Heart:  RRR. No murmur or rub. Abdomen: Soft. No mass. No tenderness. No hernia.  Normal bowel sounds.  No abdominal scars.  Right groin - 1 x 6 cm chronically draining sinus Extremities:  Good strength and ROM  in upper and lower extremities. Neurologic:  Grossly intact to motor and sensory function. Psychiatric: Has normal mood and affect. Behavior is normal.   DATA REVIEWED: Notes from Dr. Verne Spurr, MD, FACS Office:  [phone]

## 2012-09-24 ENCOUNTER — Encounter (INDEPENDENT_AMBULATORY_CARE_PROVIDER_SITE_OTHER): Payer: Self-pay | Admitting: Surgery

## 2012-09-24 ENCOUNTER — Ambulatory Visit (INDEPENDENT_AMBULATORY_CARE_PROVIDER_SITE_OTHER): Payer: Medicare Other | Admitting: Surgery

## 2012-09-24 VITALS — BP 182/64 | HR 76 | Temp 97.0°F | Resp 20 | Ht 70.0 in | Wt 179.0 lb

## 2012-09-24 DIAGNOSIS — L02214 Cutaneous abscess of groin: Secondary | ICD-10-CM

## 2012-09-24 DIAGNOSIS — L03319 Cellulitis of trunk, unspecified: Secondary | ICD-10-CM

## 2012-09-24 NOTE — Progress Notes (Signed)
[facility]  Ovidio Kin, MD,  FACS [address] [place]  [address] [place], [place]    [address] Phone:  [phone] FAX:  [phone]   Re:   ESTEL SCHOLZE DOB:   [birth year] MRN:   [id number]  Urgent Office  ASSESSMENT AND PLAN: 1.  Right groin abscess  I did an I&D in the office 09/18/2012.  He will continue to do sitz baths TID and wash the wound with soap and water.  I expect this to take 6 to 8 weeks to heal.  If the wound has not healed by that time, he will be back in touch with [place].  Otherwise his return visit is PRN.  2.  Disability since 2011 because of stroke 3.  History of stroke  But no residual now 4.  Smokes  Knows it is bad for his health 5.  HTN  HISTORY OF PRESENT ILLNESS: Chief Complaint  Patient presents with  . Routine Post Op    PO groin abscess    DEWAYNE SEVERE is a 61 y.o. (DOB: 1951/11/15)  AA male who is a patient of [facility], [facility] and comes to me today for follow up of a right groin abscess.  He was seen at [facility], but they have closed their doors.  He has done well since I did the I&D drainage last week.  He is doing a good job of cleaning the wound.  History of right groin infection: He has gone to the emergency room several times in the last year with recurrent right groin infection. He has not visit in our office yet.  He was seen by Dr. Leslee Home in the [facility] urgent center beside the emergency room yesterday and referred to our office.  Current Outpatient Prescriptions  Medication Sig Dispense Refill  . clindamycin (CLEOCIN) 300 MG capsule Take 1 capsule (300 mg total) by mouth 4 (four) times daily.  40 capsule  0  . doxycycline (VIBRAMYCIN) 100 MG capsule Take 1 capsule (100 mg total) by mouth 2 (two) times daily.  20 capsule  0  . doxycycline (VIBRAMYCIN) 100 MG capsule Take 1 capsule (100 mg total) by mouth 2 (two) times daily.  20 capsule  0  . hydrochlorothiazide (HYDRODIURIL) 25 MG tablet Take  25 mg by mouth daily.      . hydrochlorothiazide (HYDRODIURIL) 25 MG tablet Take 1 tablet (25 mg total) by mouth daily.  90 tablet  0  . HYDROcodone-acetaminophen (VICODIN) 5-500 MG per tablet Take 1-2 tablets by mouth every 6 (six) hours as needed for pain.  10 tablet  0  . lisinopril-hydrochlorothiazide (PRINZIDE,ZESTORETIC) 20-12.5 MG per tablet Take 1 tablet by mouth daily.      . naproxen (NAPROSYN) 500 MG tablet Take 500 mg by mouth 2 (two) times daily with a meal.      . SIMVASTATIN PO Take by mouth.      . traMADol (ULTRAM) 50 MG tablet Take 2 tablets (100 mg total) by mouth every 8 (eight) hours as needed for pain.  30 tablet  0  . traZODone (DESYREL) 50 MG tablet Take 50 mg by mouth at bedtime.       Social History: Divorced. Has 2 children in their 30's  PHYSICAL EXAM: BP 182/64  Pulse 76  Temp 97 F (36.1 C) (Temporal)  Resp 20  Ht 5\' 10"  (1.778 m)  Wt 179 lb (81.194 kg)  BMI 25.68 kg/m2  General: AA M who is alert and generally  healthy appearing.  Abdomen: Soft. No mass. No tenderness.  Right groin - approx 6 cm wound that is clean.    DATA REVIEWED: No new data  Ovidio Kin, MD, FACS Office:  [phone]

## 2012-10-06 DIAGNOSIS — L738 Other specified follicular disorders: Secondary | ICD-10-CM | POA: Diagnosis not present

## 2012-10-06 DIAGNOSIS — B356 Tinea cruris: Secondary | ICD-10-CM | POA: Diagnosis not present

## 2012-10-06 DIAGNOSIS — L97109 Non-pressure chronic ulcer of unspecified thigh with unspecified severity: Secondary | ICD-10-CM | POA: Diagnosis not present

## 2012-10-06 DIAGNOSIS — L678 Other hair color and hair shaft abnormalities: Secondary | ICD-10-CM | POA: Diagnosis not present

## 2012-10-06 DIAGNOSIS — I10 Essential (primary) hypertension: Secondary | ICD-10-CM | POA: Diagnosis not present

## 2012-10-06 DIAGNOSIS — Z131 Encounter for screening for diabetes mellitus: Secondary | ICD-10-CM | POA: Diagnosis not present

## 2012-10-15 DIAGNOSIS — I10 Essential (primary) hypertension: Secondary | ICD-10-CM | POA: Diagnosis not present

## 2012-10-15 DIAGNOSIS — B356 Tinea cruris: Secondary | ICD-10-CM | POA: Diagnosis not present

## 2012-10-15 DIAGNOSIS — L738 Other specified follicular disorders: Secondary | ICD-10-CM | POA: Diagnosis not present

## 2012-10-15 DIAGNOSIS — E291 Testicular hypofunction: Secondary | ICD-10-CM | POA: Diagnosis not present

## 2012-10-15 DIAGNOSIS — Z125 Encounter for screening for malignant neoplasm of prostate: Secondary | ICD-10-CM | POA: Diagnosis not present

## 2012-10-15 DIAGNOSIS — L678 Other hair color and hair shaft abnormalities: Secondary | ICD-10-CM | POA: Diagnosis not present

## 2012-10-17 DIAGNOSIS — L738 Other specified follicular disorders: Secondary | ICD-10-CM | POA: Diagnosis not present

## 2012-10-17 DIAGNOSIS — B356 Tinea cruris: Secondary | ICD-10-CM | POA: Diagnosis not present

## 2012-10-17 DIAGNOSIS — I10 Essential (primary) hypertension: Secondary | ICD-10-CM | POA: Diagnosis not present

## 2012-10-17 DIAGNOSIS — E291 Testicular hypofunction: Secondary | ICD-10-CM | POA: Diagnosis not present

## 2012-11-05 DIAGNOSIS — I10 Essential (primary) hypertension: Secondary | ICD-10-CM | POA: Diagnosis not present

## 2012-11-05 DIAGNOSIS — E785 Hyperlipidemia, unspecified: Secondary | ICD-10-CM | POA: Diagnosis not present

## 2012-11-05 DIAGNOSIS — E291 Testicular hypofunction: Secondary | ICD-10-CM | POA: Diagnosis not present

## 2012-11-05 DIAGNOSIS — N529 Male erectile dysfunction, unspecified: Secondary | ICD-10-CM | POA: Diagnosis not present

## 2013-01-07 DIAGNOSIS — I10 Essential (primary) hypertension: Secondary | ICD-10-CM | POA: Diagnosis not present

## 2013-01-07 DIAGNOSIS — B356 Tinea cruris: Secondary | ICD-10-CM | POA: Diagnosis not present

## 2013-01-07 DIAGNOSIS — S31109A Unspecified open wound of abdominal wall, unspecified quadrant without penetration into peritoneal cavity, initial encounter: Secondary | ICD-10-CM | POA: Diagnosis not present

## 2013-01-07 DIAGNOSIS — E291 Testicular hypofunction: Secondary | ICD-10-CM | POA: Diagnosis not present

## 2013-08-05 ENCOUNTER — Emergency Department (HOSPITAL_COMMUNITY)
Admission: EM | Admit: 2013-08-05 | Discharge: 2013-08-05 | Disposition: A | Discharge status: Home / Self Care | Payer: Medicare Other | Attending: Emergency Medicine | Admitting: Emergency Medicine

## 2013-08-05 ENCOUNTER — Encounter (HOSPITAL_COMMUNITY): Payer: Self-pay | Admitting: Emergency Medicine

## 2013-08-05 DIAGNOSIS — F17200 Nicotine dependence, unspecified, uncomplicated: Secondary | ICD-10-CM | POA: Diagnosis not present

## 2013-08-05 DIAGNOSIS — I10 Essential (primary) hypertension: Secondary | ICD-10-CM | POA: Diagnosis not present

## 2013-08-05 DIAGNOSIS — M545 Low back pain, unspecified: Secondary | ICD-10-CM

## 2013-08-05 DIAGNOSIS — Z79899 Other long term (current) drug therapy: Secondary | ICD-10-CM | POA: Insufficient documentation

## 2013-08-05 DIAGNOSIS — E7800 Pure hypercholesterolemia, unspecified: Secondary | ICD-10-CM | POA: Insufficient documentation

## 2013-08-05 MED ORDER — HYDROCODONE-ACETAMINOPHEN 5-325 MG PO TABS: 1.0000 | ORAL_TABLET | Freq: Four times a day (QID) | ORAL | Status: DC | PRN

## 2013-08-05 MED ORDER — METHOCARBAMOL 500 MG PO TABS: 500.0000 mg | ORAL_TABLET | Freq: Two times a day (BID) | ORAL | Status: DC

## 2013-08-05 NOTE — ED Notes (Signed)
Pt. reports low back pain onset 3 days ago , denies injury ,ambulatory , no urinary discomfort .

## 2013-08-05 NOTE — ED Provider Notes (Signed)
CSN: [id number]     Arrival date & time 08/05/13  0225 History   First MD Initiated Contact with Patient 08/05/13 [phone]     Chief Complaint  Patient presents with  . Back Pain   (Consider location/radiation/quality/duration/timing/severity/associated sxs/prior Treatment) HPI Comments: Patient presents with a chief complaint of lower back pain.  He reports that the pain has been present for the past week and is gradually worsening.  Pain located across his lower back and does not radiate.  He also reports that his back feels stiff.  He denies any acute injury or trauma.  He reports that he has been taking Aleve for the pain, but does not feel that it helps.  Pain worse with movement of the lower back.  He denies any history of Cancer or IVDU.  Patient is a 63 y.o. male presenting with back pain. The history is provided by the patient.  Back Pain Worsened by:  Bending Associated symptoms: no abdominal pain, no bladder incontinence, no bowel incontinence, no dysuria, no fever, no leg pain, no numbness, no paresthesias, no tingling, no weakness and no weight loss   Risk factors: no hx of cancer     Past Medical History  Diagnosis Date  . Hypertension   . Hypercholesterolemia    History reviewed. No pertinent past surgical history. No family history on file. History  Substance Use Topics  . Smoking status: Current Every Day Smoker -- 2.00 packs/day    Types: Cigarettes  . Smokeless tobacco: Not on file  . Alcohol Use: Yes    Review of Systems  Constitutional: Negative for fever and weight loss.  Gastrointestinal: Negative for abdominal pain and bowel incontinence.  Genitourinary: Negative for bladder incontinence and dysuria.  Musculoskeletal: Positive for back pain.  Neurological: Negative for tingling, weakness, numbness and paresthesias.  All other systems reviewed and are negative.    Allergies  Review of patient's allergies indicates no known allergies.  Home Medications    Current Outpatient Rx  Name  Route  Sig  Dispense  Refill  . clindamycin (CLEOCIN) 300 MG capsule   Oral   Take 1 capsule (300 mg total) by mouth 4 (four) times daily.   40 capsule   0   . doxycycline (VIBRAMYCIN) 100 MG capsule   Oral   Take 1 capsule (100 mg total) by mouth 2 (two) times daily.   20 capsule   0   . doxycycline (VIBRAMYCIN) 100 MG capsule   Oral   Take 1 capsule (100 mg total) by mouth 2 (two) times daily.   20 capsule   0   . hydrochlorothiazide (HYDRODIURIL) 25 MG tablet   Oral   Take 25 mg by mouth daily.         . hydrochlorothiazide (HYDRODIURIL) 25 MG tablet   Oral   Take 1 tablet (25 mg total) by mouth daily.   90 tablet   0   . HYDROcodone-acetaminophen (VICODIN) 5-500 MG per tablet   Oral   Take 1-2 tablets by mouth every 6 (six) hours as needed for pain.   10 tablet   0   . lisinopril-hydrochlorothiazide (PRINZIDE,ZESTORETIC) 20-12.5 MG per tablet   Oral   Take 1 tablet by mouth daily.         . naproxen (NAPROSYN) 500 MG tablet   Oral   Take 500 mg by mouth 2 (two) times daily with a meal.         . SIMVASTATIN PO   Oral  Take by mouth.         . traMADol (ULTRAM) 50 MG tablet   Oral   Take 2 tablets (100 mg total) by mouth every 8 (eight) hours as needed for pain.   30 tablet   0   . traZODone (DESYREL) 50 MG tablet   Oral   Take 50 mg by mouth at bedtime.          BP 165/77  Pulse 93  Temp(Src) 98 F (36.7 C) (Oral)  Resp 18  SpO2 99% Physical Exam  Nursing note and vitals reviewed. Constitutional: He is oriented to person, place, and time. He appears well-developed and well-nourished. No distress.  HENT:  Head: Normocephalic and atraumatic.  Neck: Normal range of motion and full passive range of motion without pain. Neck supple. No spinous process tenderness and no muscular tenderness present. No rigidity. Normal range of motion present. No Brudzinski's sign noted.  Cardiovascular: Normal rate,  regular rhythm, normal heart sounds and intact distal pulses.   Pulmonary/Chest: Effort normal and breath sounds normal.  Musculoskeletal:       Cervical back: He exhibits normal range of motion, no tenderness, no bony tenderness and no pain.       Thoracic back: He exhibits no tenderness, no bony tenderness and no pain.       Lumbar back: He exhibits tenderness, bony tenderness and pain. He exhibits no spasm and normal pulse.       Right foot: He exhibits no swelling.       Left foot: He exhibits no swelling.  Tenderness to palpation across lower back.  Bilateral lower extremities nontender without color change, baseline range of motion of extremities with intact distal pulses. Pt has increased pain w ROM of lumbar spine. Pain w ambulation, no sign of ataxia.  Neurological: He is alert and oriented to person, place, and time. He has normal strength and normal reflexes. No sensory deficit. Gait (no ataxia, slowed and hunched d/t pain ) abnormal.  Sensation at baseline for light touch in all 4 distal extremities, motor symmetric & bilateral 5/5 (hips: abduction, adduction, flexion; knee: flexion & extension; foot: dorsiflexion, plantar flexion, toes: dorsi flexion) Patellar & ankle reflexes intact.   Skin: Skin is warm and dry. No rash noted. He is not diaphoretic. No erythema.  Psychiatric: He has a normal mood and affect.    ED Course  Procedures (including critical care time) Labs Review Labs Reviewed - No data to display Imaging Review No results found.  MDM  No diagnosis found. Patient with back pain.  No neurological deficits and normal neuro exam.  Patient can walk but states is painful.  No loss of bowel or bladder control.  No concern for cauda equina.  No fever, night sweats, weight loss, h/o cancer, IVDU.  RICE protocol and pain medicine indicated and discussed with patient.     Pascal Lux [place], PA-C 08/05/13 [phone]

## 2013-08-05 NOTE — ED Provider Notes (Signed)
Medical screening examination/treatment/procedure(s) were performed by non-physician practitioner and as supervising physician I was immediately available for consultation/collaboration.  Lashandra Arauz F Ivanka Kirshner, MD 08/05/13 0815 

## 2013-08-05 NOTE — ED Notes (Signed)
Pt states that he is having lower back pain for several days.

## 2013-08-14 ENCOUNTER — Emergency Department (INDEPENDENT_AMBULATORY_CARE_PROVIDER_SITE_OTHER): Payer: Medicare Other

## 2013-08-14 ENCOUNTER — Encounter (HOSPITAL_COMMUNITY): Payer: Self-pay

## 2013-08-14 ENCOUNTER — Emergency Department (INDEPENDENT_AMBULATORY_CARE_PROVIDER_SITE_OTHER)
Admission: EM | Admit: 2013-08-14 | Discharge: 2013-08-14 | Disposition: A | Discharge status: Home / Self Care | Payer: Medicare Other | Source: Home / Self Care | Attending: Family Medicine | Admitting: Family Medicine

## 2013-08-14 DIAGNOSIS — M169 Osteoarthritis of hip, unspecified: Secondary | ICD-10-CM

## 2013-08-14 DIAGNOSIS — M479 Spondylosis, unspecified: Secondary | ICD-10-CM

## 2013-08-14 DIAGNOSIS — M1610 Unilateral primary osteoarthritis, unspecified hip: Secondary | ICD-10-CM | POA: Diagnosis not present

## 2013-08-14 DIAGNOSIS — M5137 Other intervertebral disc degeneration, lumbosacral region: Secondary | ICD-10-CM | POA: Diagnosis not present

## 2013-08-14 DIAGNOSIS — M51379 Other intervertebral disc degeneration, lumbosacral region without mention of lumbar back pain or lower extremity pain: Secondary | ICD-10-CM | POA: Diagnosis not present

## 2013-08-14 MED ORDER — TRAMADOL HCL 50 MG PO TABS: 50.0000 mg | ORAL_TABLET | Freq: Three times a day (TID) | ORAL | Status: DC | PRN

## 2013-08-14 MED ORDER — NAPROXEN 500 MG PO TABS: 500.0000 mg | ORAL_TABLET | Freq: Two times a day (BID) | ORAL | Status: DC | PRN

## 2013-08-14 NOTE — ED Provider Notes (Signed)
CSN: [id number]     Arrival date & time 08/14/13  1040 History   First MD Initiated Contact with Patient 08/14/13 1124     Chief Complaint  Patient presents with  . Back Pain   (Consider location/radiation/quality/duration/timing/severity/associated sxs/prior Treatment) HPI Comments: 62 year old smoker male with history of a remote stroke and hypertension. Here complaining of low back pain for at least 3 weeks. Pain is associated with right hip pain. Denies radiation to worse the right leg but reports that he drags his right leg sometimes and also needs to lift it with his hands and place it over the gas pedal while driving due to his low back pain and right hip gets worse sometimes when he moves his right leg. Denies dysuria, hematuria, flank pain, incontinence. He reports history of constipation but his last bowel movement was just today normal and denies abdominal pain. Denies voiding difficulties like straining to void or there type of LUTS. Denies low extremity weakness, numbness or paresthesias. He was seen at [facility] emergency department 9 days ago had a prescription for Norco and a muscle relaxant that he has taken inconsistently as he thinks is not helping with his symptoms.    Past Medical History  Diagnosis Date  . Hypertension   . Hypercholesterolemia    History reviewed. No pertinent past surgical history. History reviewed. No pertinent family history. History  Substance Use Topics  . Smoking status: Current Every Day Smoker -- 2.00 packs/day    Types: Cigarettes  . Smokeless tobacco: Not on file  . Alcohol Use: Yes    Review of Systems  Constitutional: Negative for fever, chills, diaphoresis, appetite change and fatigue.  HENT: Negative for congestion, sore throat and rhinorrhea.   Gastrointestinal: Negative for nausea, vomiting, abdominal pain and diarrhea.  Genitourinary: Negative for dysuria, frequency, hematuria, flank pain and decreased urine volume.  Musculoskeletal:  Positive for back pain. Negative for joint swelling.  Skin: Negative for rash.  All other systems reviewed and are negative.    Allergies  Review of patient's allergies indicates no known allergies.  Home Medications   Current Outpatient Rx  Name  Route  Sig  Dispense  Refill  . methocarbamol (ROBAXIN) 500 MG tablet   Oral   Take 1 tablet (500 mg total) by mouth 2 (two) times daily.   20 tablet   0   . hydrochlorothiazide (HYDRODIURIL) 25 MG tablet   Oral   Take 1 tablet (25 mg total) by mouth daily.   90 tablet   0   . lisinopril-hydrochlorothiazide (PRINZIDE,ZESTORETIC) 20-12.5 MG per tablet   Oral   Take 1 tablet by mouth daily.         . naproxen (NAPROSYN) 500 MG tablet   Oral   Take 1 tablet (500 mg total) by mouth 2 (two) times daily as needed (take with meals).   30 tablet   0   . SIMVASTATIN PO   Oral   Take by mouth.         . traMADol (ULTRAM) 50 MG tablet   Oral   Take 1 tablet (50 mg total) by mouth every 8 (eight) hours as needed for pain.   20 tablet   0   . traZODone (DESYREL) 50 MG tablet   Oral   Take 50 mg by mouth at bedtime.          BP 162/79  Pulse 76  Temp(Src) 98.3 F (36.8 C) (Oral)  Resp 20  SpO2 100% Physical Exam  Nursing note and vitals reviewed. Constitutional: He is oriented to person, place, and time. He appears well-developed and well-nourished. No distress.  HENT:  Head: Normocephalic and atraumatic.  Mouth/Throat: No oropharyngeal exudate.  Eyes: Conjunctivae are normal.  Cardiovascular: Normal heart sounds.   Pulmonary/Chest: Breath sounds normal.  Abdominal: Soft. There is no tenderness.  Musculoskeletal: He exhibits no edema.  Central spine with no scoliosis or kyphosis. Fair anterior flexion and posterior extension. Patient able to walk on tip toes and heels with no difficulty foot drop or pain exacerbation. No bone prominence tenderness. Tenderness to palpation in bilateral lumbar paravertebral muscles.   Negative straight leg test bilateral. Intact sensation and symmetric + DTRs (rotullian and achillean) in low extremities. Right Hip: reported tenderness with palpation anterior and lateral to hip joint. No redness, swelling r increased temperature. Pain with passive and active right hip flexion and internal and external rotation. Despite reported pain appears to have fair range of motion. Patient is weight bearing with no obvious limp.  Entire right lower extremity appears neurovascularly intact.   Lymphadenopathy:    He has no cervical adenopathy.  Neurological: He is alert and oriented to person, place, and time.  Skin: No rash noted. He is not diaphoretic.    ED Course  Procedures (including critical care time) Labs Review Labs Reviewed - No data to display Imaging Review Dg Lumbar Spine Complete  08/14/2013   *RADIOLOGY REPORT*  Clinical Data: Low back pain extending down right leg and 9 days. No injury.  LUMBAR SPINE - COMPLETE 4+ VIEW  Comparison: Right hip films same date.  Findings: Normal alignment of the lumbar spine.  Very mild L3-4 disc space narrowing with osteophyte.  Minimal L4-5 disc space narrowing with osteophyte.  L5-S1 mild bilateral facet joint degenerative changes.  No pars defect noted.  Vascular calcifications.  IMPRESSION: Normal alignment of the lumbar spine.  Very mild L3-4 disc space narrowing with osteophyte.  Minimal L4-5 disc space narrowing with osteophyte.  L5-S1 mild bilateral facet joint degenerative changes.  No pars defect noted.  Vascular calcifications.   Original Report Authenticated By: Lacy Duverney, M.D.   Dg Hip Complete Right  08/14/2013   *RADIOLOGY REPORT*  Clinical Data: Pain lower back extending into the right leg for 9 days.  No known injury.  RIGHT HIP - COMPLETE 2+ VIEW  Comparison: None.  Findings: Mild bilateral hip joint degenerative changes with subchondral cystic changes of the superior aspect of the right acetabulum.  No plain film  evidence of femoral head avascular necrosis.  No fracture or dislocation.  IMPRESSION: Mild bilateral hip joint degenerative changes with subchondral cystic changes of the superior aspect of the right acetabulum.   Original Report Authenticated By: Lacy Duverney, M.D.    MDM   1. DJD (degenerative joint disease) of hip   2. Degenerative joint disease of low back     Treated with naproxen and tramadol. Reccomended follow up with a PCP. Supportive care including rehabilitation exercises and red flags that should prompt return to medical attention discussed with patient and provided in writing.    Sharin Grave, MD 08/15/13 1011

## 2013-08-14 NOTE — ED Notes (Signed)
C/o pain in low back and into his right leg for past few days and cannot drive unless he picks up his foot w his hands to take it off the gas or picks it up to apply brakes. ADVISED THIS IS A DANGEROUS PRACTICE FOR HIM AND OTHER PEOPLE IN HIS GENERAL VICINITY and for him to stop this practice NOW. DEnies saddle anesthesia or loss of bowel or bladder control

## 2013-08-28 DIAGNOSIS — R5381 Other malaise: Secondary | ICD-10-CM | POA: Diagnosis not present

## 2013-08-28 DIAGNOSIS — M545 Low back pain, unspecified: Secondary | ICD-10-CM | POA: Diagnosis not present

## 2013-08-28 DIAGNOSIS — I10 Essential (primary) hypertension: Secondary | ICD-10-CM | POA: Diagnosis not present

## 2013-08-28 DIAGNOSIS — R5383 Other fatigue: Secondary | ICD-10-CM | POA: Diagnosis not present

## 2013-08-28 DIAGNOSIS — I119 Hypertensive heart disease without heart failure: Secondary | ICD-10-CM | POA: Diagnosis not present

## 2013-08-28 DIAGNOSIS — F17200 Nicotine dependence, unspecified, uncomplicated: Secondary | ICD-10-CM | POA: Diagnosis not present

## 2013-08-28 DIAGNOSIS — Z125 Encounter for screening for malignant neoplasm of prostate: Secondary | ICD-10-CM | POA: Diagnosis not present

## 2013-08-28 DIAGNOSIS — I739 Peripheral vascular disease, unspecified: Secondary | ICD-10-CM | POA: Diagnosis not present

## 2013-09-04 DIAGNOSIS — I119 Hypertensive heart disease without heart failure: Secondary | ICD-10-CM | POA: Diagnosis not present

## 2013-09-17 DIAGNOSIS — I119 Hypertensive heart disease without heart failure: Secondary | ICD-10-CM | POA: Diagnosis not present

## 2013-09-17 DIAGNOSIS — M545 Low back pain: Secondary | ICD-10-CM | POA: Diagnosis not present

## 2013-09-17 DIAGNOSIS — I10 Essential (primary) hypertension: Secondary | ICD-10-CM | POA: Diagnosis not present

## 2013-09-17 DIAGNOSIS — E785 Hyperlipidemia, unspecified: Secondary | ICD-10-CM | POA: Diagnosis not present

## 2013-09-17 DIAGNOSIS — E559 Vitamin D deficiency, unspecified: Secondary | ICD-10-CM | POA: Diagnosis not present

## 2013-09-17 DIAGNOSIS — G4700 Insomnia, unspecified: Secondary | ICD-10-CM | POA: Diagnosis not present

## 2013-09-17 DIAGNOSIS — F17200 Nicotine dependence, unspecified, uncomplicated: Secondary | ICD-10-CM | POA: Diagnosis not present

## 2013-09-17 DIAGNOSIS — F528 Other sexual dysfunction not due to a substance or known physiological condition: Secondary | ICD-10-CM | POA: Diagnosis not present

## 2013-10-16 DIAGNOSIS — M545 Low back pain, unspecified: Secondary | ICD-10-CM | POA: Diagnosis not present

## 2013-10-16 DIAGNOSIS — E559 Vitamin D deficiency, unspecified: Secondary | ICD-10-CM | POA: Diagnosis not present

## 2013-10-16 DIAGNOSIS — F528 Other sexual dysfunction not due to a substance or known physiological condition: Secondary | ICD-10-CM | POA: Diagnosis not present

## 2013-10-16 DIAGNOSIS — E785 Hyperlipidemia, unspecified: Secondary | ICD-10-CM | POA: Diagnosis not present

## 2013-10-16 DIAGNOSIS — F17200 Nicotine dependence, unspecified, uncomplicated: Secondary | ICD-10-CM | POA: Diagnosis not present

## 2013-10-16 DIAGNOSIS — I119 Hypertensive heart disease without heart failure: Secondary | ICD-10-CM | POA: Diagnosis not present

## 2013-10-16 DIAGNOSIS — I10 Essential (primary) hypertension: Secondary | ICD-10-CM | POA: Diagnosis not present

## 2013-10-16 DIAGNOSIS — G4700 Insomnia, unspecified: Secondary | ICD-10-CM | POA: Diagnosis not present

## 2014-02-15 DIAGNOSIS — M545 Low back pain, unspecified: Secondary | ICD-10-CM | POA: Diagnosis not present

## 2014-02-15 DIAGNOSIS — E785 Hyperlipidemia, unspecified: Secondary | ICD-10-CM | POA: Diagnosis not present

## 2014-02-15 DIAGNOSIS — I10 Essential (primary) hypertension: Secondary | ICD-10-CM | POA: Diagnosis not present

## 2014-02-15 DIAGNOSIS — E559 Vitamin D deficiency, unspecified: Secondary | ICD-10-CM | POA: Diagnosis not present

## 2014-02-15 DIAGNOSIS — G4700 Insomnia, unspecified: Secondary | ICD-10-CM | POA: Diagnosis not present

## 2014-02-15 DIAGNOSIS — R0789 Other chest pain: Secondary | ICD-10-CM | POA: Diagnosis not present

## 2014-02-15 DIAGNOSIS — F17200 Nicotine dependence, unspecified, uncomplicated: Secondary | ICD-10-CM | POA: Diagnosis not present

## 2014-02-15 DIAGNOSIS — F528 Other sexual dysfunction not due to a substance or known physiological condition: Secondary | ICD-10-CM | POA: Diagnosis not present

## 2014-02-15 DIAGNOSIS — I119 Hypertensive heart disease without heart failure: Secondary | ICD-10-CM | POA: Diagnosis not present

## 2014-02-15 DIAGNOSIS — R7301 Impaired fasting glucose: Secondary | ICD-10-CM | POA: Diagnosis not present

## 2014-03-02 DIAGNOSIS — R7301 Impaired fasting glucose: Secondary | ICD-10-CM | POA: Diagnosis not present

## 2014-03-02 DIAGNOSIS — R1013 Epigastric pain: Secondary | ICD-10-CM | POA: Diagnosis not present

## 2014-03-02 DIAGNOSIS — G4700 Insomnia, unspecified: Secondary | ICD-10-CM | POA: Diagnosis not present

## 2014-03-02 DIAGNOSIS — M545 Low back pain, unspecified: Secondary | ICD-10-CM | POA: Diagnosis not present

## 2014-03-02 DIAGNOSIS — E785 Hyperlipidemia, unspecified: Secondary | ICD-10-CM | POA: Diagnosis not present

## 2014-03-02 DIAGNOSIS — F528 Other sexual dysfunction not due to a substance or known physiological condition: Secondary | ICD-10-CM | POA: Diagnosis not present

## 2014-03-02 DIAGNOSIS — I119 Hypertensive heart disease without heart failure: Secondary | ICD-10-CM | POA: Diagnosis not present

## 2014-03-02 DIAGNOSIS — K3189 Other diseases of stomach and duodenum: Secondary | ICD-10-CM | POA: Diagnosis not present

## 2014-03-02 DIAGNOSIS — K5900 Constipation, unspecified: Secondary | ICD-10-CM | POA: Diagnosis not present

## 2014-03-02 DIAGNOSIS — I10 Essential (primary) hypertension: Secondary | ICD-10-CM | POA: Diagnosis not present

## 2014-03-20 ENCOUNTER — Encounter (HOSPITAL_COMMUNITY): Payer: Self-pay | Admitting: Emergency Medicine

## 2014-03-20 ENCOUNTER — Emergency Department (HOSPITAL_COMMUNITY)
Admission: EM | Admit: 2014-03-20 | Discharge: 2014-03-20 | Disposition: A | Discharge status: Home / Self Care | Payer: Medicare Other | Attending: Emergency Medicine | Admitting: Emergency Medicine

## 2014-03-20 DIAGNOSIS — M545 Low back pain, unspecified: Secondary | ICD-10-CM | POA: Insufficient documentation

## 2014-03-20 DIAGNOSIS — M549 Dorsalgia, unspecified: Secondary | ICD-10-CM

## 2014-03-20 DIAGNOSIS — F17200 Nicotine dependence, unspecified, uncomplicated: Secondary | ICD-10-CM | POA: Insufficient documentation

## 2014-03-20 DIAGNOSIS — I10 Essential (primary) hypertension: Secondary | ICD-10-CM | POA: Insufficient documentation

## 2014-03-20 DIAGNOSIS — E7800 Pure hypercholesterolemia, unspecified: Secondary | ICD-10-CM | POA: Diagnosis not present

## 2014-03-20 DIAGNOSIS — G8929 Other chronic pain: Secondary | ICD-10-CM | POA: Insufficient documentation

## 2014-03-20 DIAGNOSIS — Z79899 Other long term (current) drug therapy: Secondary | ICD-10-CM | POA: Diagnosis not present

## 2014-03-20 MED ORDER — CYCLOBENZAPRINE HCL 10 MG PO TABS: 10.0000 mg | ORAL_TABLET | Freq: Two times a day (BID) | ORAL | Status: DC | PRN

## 2014-03-20 MED ORDER — NAPROXEN 500 MG PO TABS: 500.0000 mg | ORAL_TABLET | Freq: Two times a day (BID) | ORAL | Status: DC

## 2014-03-20 MED ORDER — DIAZEPAM 5 MG PO TABS
5.0000 mg | ORAL_TABLET | Freq: Once | ORAL | Status: AC
  Administered 2014-03-20: 5 mg via ORAL
  Filled 2014-03-20: qty 1

## 2014-03-20 MED ORDER — KETOROLAC TROMETHAMINE 60 MG/2ML IM SOLN
60.0000 mg | Freq: Once | INTRAMUSCULAR | Status: AC
  Administered 2014-03-20: 60 mg via INTRAMUSCULAR
  Filled 2014-03-20: qty 2

## 2014-03-20 NOTE — ED Provider Notes (Signed)
CSN: [id number]     Arrival date & time 03/20/14  [id number] History   First MD Initiated Contact with Patient 03/20/14 [phone]     Chief Complaint  Patient presents with  . Back Pain     (Consider location/radiation/quality/duration/timing/severity/associated sxs/prior Treatment) HPI Comments: Patient is a 63 year old male with a past medical history of chronic low back pain who presents with gradual onset of lower back pain that started 3-4 days ago after helping his friend move furniture. The pain is aching and severe and radiates down his left leg. The pain is constant. Movement makes the pain worse. Nothing makes the pain better. Patient has tried OTC for pain without relief. No associated symptoms. No saddle paresthesias or bladder/bowel incontinence.      Past Medical History  Diagnosis Date  . Hypertension   . Hypercholesterolemia    History reviewed. No pertinent past surgical history. No family history on file. History  Substance Use Topics  . Smoking status: Current Every Day Smoker -- 2.00 packs/day    Types: Cigarettes  . Smokeless tobacco: Not on file  . Alcohol Use: No    Review of Systems  Constitutional: Negative for fever, chills and fatigue.  HENT: Negative for trouble swallowing.   Eyes: Negative for visual disturbance.  Respiratory: Negative for shortness of breath.   Cardiovascular: Negative for chest pain and palpitations.  Gastrointestinal: Negative for nausea, vomiting, abdominal pain and diarrhea.  Genitourinary: Negative for dysuria and difficulty urinating.  Musculoskeletal: Positive for back pain. Negative for arthralgias and neck pain.  Skin: Negative for color change.  Neurological: Negative for dizziness and weakness.  Psychiatric/Behavioral: Negative for dysphoric mood.      Allergies  Review of patient's allergies indicates no known allergies.  Home Medications   Prior to Admission medications   Medication Sig Start Date End Date Taking?  Authorizing Provider  hydrochlorothiazide (HYDRODIURIL) 25 MG tablet Take 1 tablet (25 mg total) by mouth daily. 09/17/12   Reuben Likesavid C Keller, MD  lisinopril-hydrochlorothiazide (PRINZIDE,ZESTORETIC) 20-12.5 MG per tablet Take 1 tablet by mouth daily.    Historical Provider, MD  methocarbamol (ROBAXIN) 500 MG tablet Take 1 tablet (500 mg total) by mouth 2 (two) times daily. 08/05/13   Heather Laisure, PA-C  naproxen (NAPROSYN) 500 MG tablet Take 1 tablet (500 mg total) by mouth 2 (two) times daily as needed (take with meals). 08/14/13   Adlih Moreno-Coll, MD  SIMVASTATIN PO Take by mouth.    Historical Provider, MD  traMADol (ULTRAM) 50 MG tablet Take 1 tablet (50 mg total) by mouth every 8 (eight) hours as needed for pain. 08/14/13   Adlih Moreno-Coll, MD  traZODone (DESYREL) 50 MG tablet Take 50 mg by mouth at bedtime.    Historical Provider, MD   BP 151/86  Pulse 73  Temp(Src) 97.9 F (36.6 C) (Oral)  Resp 18  Ht 5\' 10"  (1.778 m)  Wt 170 lb (77.111 kg)  BMI 24.39 kg/m2  SpO2 100% Physical Exam  Nursing note and vitals reviewed. Constitutional: He is oriented to person, place, and time. He appears well-developed and well-nourished. No distress.  HENT:  Head: Normocephalic and atraumatic.  Eyes: Conjunctivae and EOM are normal.  Neck: Normal range of motion.  Cardiovascular: Normal rate and regular rhythm.  Exam reveals no gallop and no friction rub.   No murmur heard. Pulmonary/Chest: Effort normal and breath sounds normal. He has no wheezes. He has no rales. He exhibits no tenderness.  Abdominal: Soft. There is  no tenderness.  Musculoskeletal: Normal range of motion.  No midline spine tenderness to palpation. Left lumbar paraspinal tenderness to palpation.   Neurological: He is alert and oriented to person, place, and time. Coordination normal.  Lower extremity strength and sensation equal and intact bilaterally. Speech is goal-oriented. Moves limbs without ataxia.   Skin: Skin is warm  and dry.  Psychiatric: He has a normal mood and affect. His behavior is normal.    ED Course  Procedures (including critical care time) Labs Review Labs Reviewed - No data to display  Imaging Review No results found.   EKG Interpretation None      MDM   Final diagnoses:  Back pain    8:26 AM Patient given toradol IM and valium. No bladder/bowel incontinence or saddle paresthesias. Vitals stable and patient afebrile. Patient likely strained a muscle in his lumbar region. Vitals stable and patient afebrile. Patient will be discharged with Naprosyn and Flexeril.     Emilia BeckKaitlyn Yama Nielson, [place] JerseyPA-C 03/20/14 [phone]

## 2014-03-20 NOTE — ED Notes (Signed)
Pt c/o low back pain onset 3-4 days ago after lifting heavy furniture. Pt reports pain radiates down right leg.

## 2014-03-20 NOTE — Discharge Instructions (Signed)
Take Naprosyn as needed for pain. Take Flexeril as needed for muscle spasm. You may take these medications together. Refer to attached documents for more information. Follow up with your doctor for further evaluation as needed.

## 2014-03-20 NOTE — ED Provider Notes (Signed)
Medical screening examination/treatment/procedure(s) were performed by non-physician practitioner and as supervising physician I was immediately available for consultation/collaboration.   EKG Interpretation None       Doug SouSam Linsy Ehresman, MD 03/20/14 1724

## 2014-05-25 DIAGNOSIS — I10 Essential (primary) hypertension: Secondary | ICD-10-CM | POA: Diagnosis not present

## 2014-05-25 DIAGNOSIS — M545 Low back pain, unspecified: Secondary | ICD-10-CM | POA: Diagnosis not present

## 2014-05-25 DIAGNOSIS — F17200 Nicotine dependence, unspecified, uncomplicated: Secondary | ICD-10-CM | POA: Diagnosis not present

## 2014-05-25 DIAGNOSIS — R1013 Epigastric pain: Secondary | ICD-10-CM | POA: Diagnosis not present

## 2014-05-25 DIAGNOSIS — R7301 Impaired fasting glucose: Secondary | ICD-10-CM | POA: Diagnosis not present

## 2014-05-25 DIAGNOSIS — F528 Other sexual dysfunction not due to a substance or known physiological condition: Secondary | ICD-10-CM | POA: Diagnosis not present

## 2014-05-25 DIAGNOSIS — I119 Hypertensive heart disease without heart failure: Secondary | ICD-10-CM | POA: Diagnosis not present

## 2014-05-25 DIAGNOSIS — E785 Hyperlipidemia, unspecified: Secondary | ICD-10-CM | POA: Diagnosis not present

## 2014-05-25 DIAGNOSIS — K3189 Other diseases of stomach and duodenum: Secondary | ICD-10-CM | POA: Diagnosis not present

## 2014-07-27 DIAGNOSIS — L0291 Cutaneous abscess, unspecified: Secondary | ICD-10-CM | POA: Diagnosis not present

## 2014-11-22 DIAGNOSIS — I10 Essential (primary) hypertension: Secondary | ICD-10-CM | POA: Diagnosis not present

## 2014-11-22 DIAGNOSIS — L0231 Cutaneous abscess of buttock: Secondary | ICD-10-CM | POA: Diagnosis not present

## 2014-11-22 DIAGNOSIS — I119 Hypertensive heart disease without heart failure: Secondary | ICD-10-CM | POA: Diagnosis not present

## 2014-11-22 DIAGNOSIS — Z13228 Encounter for screening for other metabolic disorders: Secondary | ICD-10-CM | POA: Diagnosis not present

## 2014-11-22 DIAGNOSIS — N529 Male erectile dysfunction, unspecified: Secondary | ICD-10-CM | POA: Diagnosis not present

## 2014-11-28 DIAGNOSIS — Z13228 Encounter for screening for other metabolic disorders: Secondary | ICD-10-CM | POA: Diagnosis not present

## 2014-11-28 DIAGNOSIS — I10 Essential (primary) hypertension: Secondary | ICD-10-CM | POA: Diagnosis not present

## 2014-11-28 DIAGNOSIS — N529 Male erectile dysfunction, unspecified: Secondary | ICD-10-CM | POA: Diagnosis not present

## 2014-12-14 DIAGNOSIS — N529 Male erectile dysfunction, unspecified: Secondary | ICD-10-CM | POA: Diagnosis not present

## 2014-12-14 DIAGNOSIS — K625 Hemorrhage of anus and rectum: Secondary | ICD-10-CM | POA: Diagnosis not present

## 2014-12-14 DIAGNOSIS — I10 Essential (primary) hypertension: Secondary | ICD-10-CM | POA: Diagnosis not present

## 2014-12-14 DIAGNOSIS — K5900 Constipation, unspecified: Secondary | ICD-10-CM | POA: Diagnosis not present

## 2015-01-24 DIAGNOSIS — N529 Male erectile dysfunction, unspecified: Secondary | ICD-10-CM | POA: Diagnosis not present

## 2015-02-04 DIAGNOSIS — K5900 Constipation, unspecified: Secondary | ICD-10-CM | POA: Diagnosis not present

## 2015-02-04 DIAGNOSIS — R194 Change in bowel habit: Secondary | ICD-10-CM | POA: Diagnosis not present

## 2015-02-04 DIAGNOSIS — Z86010 Personal history of colonic polyps: Secondary | ICD-10-CM | POA: Diagnosis not present

## 2015-02-08 DIAGNOSIS — K648 Other hemorrhoids: Secondary | ICD-10-CM | POA: Diagnosis not present

## 2015-02-08 DIAGNOSIS — Z86010 Personal history of colonic polyps: Secondary | ICD-10-CM | POA: Diagnosis not present

## 2015-02-08 DIAGNOSIS — Z09 Encounter for follow-up examination after completed treatment for conditions other than malignant neoplasm: Secondary | ICD-10-CM | POA: Diagnosis not present

## 2015-02-08 DIAGNOSIS — K5730 Diverticulosis of large intestine without perforation or abscess without bleeding: Secondary | ICD-10-CM | POA: Diagnosis not present

## 2015-09-06 ENCOUNTER — Encounter (HOSPITAL_COMMUNITY): Payer: Self-pay | Admitting: Emergency Medicine

## 2015-09-06 ENCOUNTER — Emergency Department (HOSPITAL_COMMUNITY)
Admission: EM | Admit: 2015-09-06 | Discharge: 2015-09-06 | Disposition: A | Discharge status: Home / Self Care | Payer: Medicare Other | Attending: Emergency Medicine | Admitting: Emergency Medicine

## 2015-09-06 ENCOUNTER — Emergency Department (HOSPITAL_COMMUNITY): Payer: Medicare Other

## 2015-09-06 DIAGNOSIS — K5732 Diverticulitis of large intestine without perforation or abscess without bleeding: Secondary | ICD-10-CM | POA: Insufficient documentation

## 2015-09-06 DIAGNOSIS — I10 Essential (primary) hypertension: Secondary | ICD-10-CM | POA: Diagnosis not present

## 2015-09-06 DIAGNOSIS — E7800 Pure hypercholesterolemia, unspecified: Secondary | ICD-10-CM | POA: Insufficient documentation

## 2015-09-06 DIAGNOSIS — Z79899 Other long term (current) drug therapy: Secondary | ICD-10-CM | POA: Insufficient documentation

## 2015-09-06 DIAGNOSIS — Z720 Tobacco use: Secondary | ICD-10-CM | POA: Diagnosis not present

## 2015-09-06 DIAGNOSIS — R109 Unspecified abdominal pain: Secondary | ICD-10-CM | POA: Diagnosis present

## 2015-09-06 DIAGNOSIS — K5900 Constipation, unspecified: Secondary | ICD-10-CM | POA: Diagnosis not present

## 2015-09-06 LAB — COMPREHENSIVE METABOLIC PANEL
ALT: 14 U/L — ABNORMAL LOW (ref 17–63)
ANION GAP: 7 (ref 5–15)
AST: 23 U/L (ref 15–41)
Albumin: 3.9 g/dL (ref 3.5–5.0)
Alkaline Phosphatase: 81 U/L (ref 38–126)
BUN: 7 mg/dL (ref 6–20)
CHLORIDE: 107 mmol/L (ref 101–111)
CO2: 24 mmol/L (ref 22–32)
Calcium: 9.6 mg/dL (ref 8.9–10.3)
Creatinine, Ser: 1 mg/dL (ref 0.61–1.24)
Glucose, Bld: 102 mg/dL — ABNORMAL HIGH (ref 65–99)
POTASSIUM: 4.1 mmol/L (ref 3.5–5.1)
Sodium: 138 mmol/L (ref 135–145)
TOTAL PROTEIN: 7.6 g/dL (ref 6.5–8.1)
Total Bilirubin: 0.6 mg/dL (ref 0.3–1.2)

## 2015-09-06 LAB — CBC WITH DIFFERENTIAL/PLATELET
BASOS ABS: 0 10*3/uL (ref 0.0–0.1)
Basophils Relative: 0 %
EOS PCT: 2 %
Eosinophils Absolute: 0.1 10*3/uL (ref 0.0–0.7)
HCT: 44.8 % (ref 39.0–52.0)
Hemoglobin: 15.9 g/dL (ref 13.0–17.0)
LYMPHS PCT: 35 %
Lymphs Abs: 2.4 10*3/uL (ref 0.7–4.0)
MCH: 32.6 pg (ref 26.0–34.0)
MCHC: 35.5 g/dL (ref 30.0–36.0)
MCV: 91.8 fL (ref 78.0–100.0)
MONO ABS: 0.6 10*3/uL (ref 0.1–1.0)
MONOS PCT: 8 %
Neutro Abs: 3.7 10*3/uL (ref 1.7–7.7)
Neutrophils Relative %: 55 %
PLATELETS: 180 10*3/uL (ref 150–400)
RBC: 4.88 MIL/uL (ref 4.22–5.81)
RDW: 14 % (ref 11.5–15.5)
WBC: 6.8 10*3/uL (ref 4.0–10.5)

## 2015-09-06 LAB — URINALYSIS, ROUTINE W REFLEX MICROSCOPIC
BILIRUBIN URINE: NEGATIVE
Glucose, UA: NEGATIVE mg/dL
Hgb urine dipstick: NEGATIVE
KETONES UR: NEGATIVE mg/dL
LEUKOCYTES UA: NEGATIVE
NITRITE: NEGATIVE
PROTEIN: NEGATIVE mg/dL
Specific Gravity, Urine: 1.018 (ref 1.005–1.030)
UROBILINOGEN UA: 0.2 mg/dL (ref 0.0–1.0)
pH: 5 (ref 5.0–8.0)

## 2015-09-06 LAB — LIPASE, BLOOD: LIPASE: 19 U/L — AB (ref 22–51)

## 2015-09-06 MED ORDER — METRONIDAZOLE 500 MG PO TABS
500.0000 mg | ORAL_TABLET | Freq: Once | ORAL | Status: AC
  Administered 2015-09-06: 500 mg via ORAL
  Filled 2015-09-06: qty 1

## 2015-09-06 MED ORDER — METRONIDAZOLE 500 MG PO TABS: 500.0000 mg | ORAL_TABLET | Freq: Two times a day (BID) | ORAL | Status: DC

## 2015-09-06 MED ORDER — IOHEXOL 300 MG/ML  SOLN
25.0000 mL | Freq: Once | INTRAMUSCULAR | Status: AC | PRN
  Administered 2015-09-06: 25 mL via ORAL

## 2015-09-06 MED ORDER — CIPROFLOXACIN HCL 500 MG PO TABS: 500.0000 mg | ORAL_TABLET | Freq: Two times a day (BID) | ORAL | Status: DC

## 2015-09-06 MED ORDER — CIPROFLOXACIN HCL 500 MG PO TABS
500.0000 mg | ORAL_TABLET | Freq: Once | ORAL | Status: AC
  Administered 2015-09-06: 500 mg via ORAL
  Filled 2015-09-06: qty 1

## 2015-09-06 MED ORDER — DOCUSATE SODIUM 100 MG PO CAPS: 100.0000 mg | ORAL_CAPSULE | Freq: Two times a day (BID) | ORAL | Status: DC

## 2015-09-06 MED ORDER — IOHEXOL 300 MG/ML  SOLN
100.0000 mL | Freq: Once | INTRAMUSCULAR | Status: AC | PRN
  Administered 2015-09-06: 100 mL via INTRAVENOUS

## 2015-09-06 MED ORDER — HYDROCODONE-ACETAMINOPHEN 5-325 MG PO TABS: 1.0000 | ORAL_TABLET | Freq: Four times a day (QID) | ORAL | Status: DC | PRN

## 2015-09-06 NOTE — Discharge Instructions (Signed)
IMPRESSION: 1. Acute diverticulitis involving the junction of the descending and sigmoid colon. No associated abscess or perforation. 2. No bowel obstruction. 3. Abdominal aortic atherosclerosis. 4. Mildly prominent right internal iliac lymph node. Although nonspecific, correlation with PSA is recommended.  **Please follow up with primary care provider about lymph node, to obtain PSA lab in clinic. Diverticulitis Diverticulitis is when small pockets that have formed in your colon (large intestine) become infected or swollen. HOME CARE  Follow your doctor's instructions.  Follow a special diet if told by your doctor.  When you feel better, your doctor may tell you to change your diet. You may be told to eat a lot of fiber. Fruits and vegetables are good sources of fiber. Fiber makes it easier to poop (have bowel movements).  Take supplements or probiotics as told by your doctor.  Only take medicines as told by your doctor.  Keep all follow-up visits with your doctor. GET HELP IF:  Your pain does not get better.  You have a hard time eating food.  You are not pooping like normal. GET HELP RIGHT AWAY IF:  Your pain gets worse.  Your problems do not get better.  Your problems suddenly get worse.  You have a fever.  You keep throwing up (vomiting).  You have bloody or black, tarry poop (stool). MAKE SURE YOU:   Understand these instructions.  Will watch your condition.  Will get help right away if you are not doing well or get worse. Document Released: 05/07/2008 Document Revised: 11/24/2013 Document Reviewed: 10/14/2013 [facility] Patient Information 2015 [place], [place]. This information is not intended to replace advice given to you by your health care provider. Make sure you discuss any questions you have with your health care provider.

## 2015-09-06 NOTE — ED Notes (Signed)
Patient transported to CT 

## 2015-09-06 NOTE — ED Notes (Signed)
Pt reports hx of constipation. Pt noted abdominal pain to left lower quadrant painful to palpation since Saturday. Pt thought he was constipated so he took a laxative. Had a large BM and then another smaller BM yesterday. Pt reports relief from constipation. Pt still reports left lower quadrant abdominal pain.

## 2015-09-06 NOTE — ED Provider Notes (Signed)
CSN: [id number]     Arrival date & time 09/06/15  0756 History   First MD Initiated Contact with Patient 09/06/15 [phone]     Chief Complaint  Patient presents with  . Abdominal Pain     (Consider location/radiation/quality/duration/timing/severity/associated sxs/prior Treatment) HPI Comments: 64 year old male with hypertension and hyperlipidemia who presents with left-sided abdominal pain. 3 days ago, the patient began having left lower quadrant abdominal pain that is nonradiating, constant, mild at rest but becomes severe with movement. He has a long history of constipation and was constipated recently so he took several laxity is. He has had several nonbloody bowel movements since yesterday and states that his constipation improved but his abdominal pain did not change. He denies any associated fevers, nausea, vomiting, urinary symptoms, chest pain, or shortness of breath.  Patient is a 64 y.o. male presenting with abdominal pain. The history is provided by the patient.  Abdominal Pain   Past Medical History  Diagnosis Date  . Hypertension   . Hypercholesterolemia    History reviewed. No pertinent past surgical history. History reviewed. No pertinent family history. Social History  Substance Use Topics  . Smoking status: Current Every Day Smoker -- 2.00 packs/day    Types: Cigarettes  . Smokeless tobacco: None  . Alcohol Use: No    Review of Systems  Gastrointestinal: Positive for abdominal pain.    10 Systems reviewed and are negative for acute change except as noted in the HPI.   Allergies  Review of patient's allergies indicates no known allergies.  Home Medications   Prior to Admission medications   Medication Sig Start Date End Date Taking? Authorizing Provider  amLODipine-benazepril (LOTREL) 10-20 MG per capsule Take 1 capsule by mouth daily. 03/17/14  Yes Historical Provider, MD  ciprofloxacin (CIPRO) 500 MG tablet Take 1 tablet (500 mg total) by mouth 2 (two)  times daily. 09/06/15   Laurence Spates, MD  docusate sodium (COLACE) 100 MG capsule Take 1 capsule (100 mg total) by mouth every 12 (twelve) hours. 09/06/15   Laurence Spates, MD  HYDROcodone-acetaminophen (NORCO/VICODIN) 5-325 MG tablet Take 1-2 tablets by mouth every 6 (six) hours as needed for severe pain. 09/06/15   Laurence Spates, MD  metroNIDAZOLE (FLAGYL) 500 MG tablet Take 1 tablet (500 mg total) by mouth 2 (two) times daily. 09/06/15   Ambrose Finland Bryana Froemming, MD   BP 164/77 mmHg  Pulse 62  Temp(Src) 97.3 F (36.3 C) (Oral)  Resp 16  Ht 5\' 11"  (1.803 m)  Wt 187 lb (84.823 kg)  BMI 26.09 kg/m2  SpO2 99% Physical Exam  Constitutional: He is oriented to person, place, and time. He appears well-developed and well-nourished. No distress.  HENT:  Head: Normocephalic and atraumatic.  Moist mucous membranes  Eyes: Conjunctivae are normal. Pupils are equal, round, and reactive to light.  Neck: Neck supple.  Cardiovascular: Normal rate, regular rhythm and normal heart sounds.   No murmur heard. Pulmonary/Chest: Effort normal and breath sounds normal.  Abdominal: Soft. Bowel sounds are normal. He exhibits no distension.  Tenderness to palpation of the left lower quadrant, right lower quadrant, and suprapubic abdomen, worst in left lower quadrant, with no rebound or guarding; no peritonitis  Musculoskeletal: He exhibits no edema.  Neurological: He is alert and oriented to person, place, and time.  Fluent speech  Skin: Skin is warm and dry.  Psychiatric: He has a normal mood and affect. Judgment normal.  Nursing note and vitals reviewed.   ED Course  Procedures (including critical care time) Labs Review Labs Reviewed  COMPREHENSIVE METABOLIC PANEL - Abnormal; Notable for the following:    Glucose, Bld 102 (*)    ALT 14 (*)    All other components within normal limits  LIPASE, BLOOD - Abnormal; Notable for the following:    Lipase 19 (*)    All other components within  normal limits  CBC WITH DIFFERENTIAL/PLATELET  URINALYSIS, ROUTINE W REFLEX MICROSCOPIC (NOT AT [facility])    Imaging Review Ct Abdomen Pelvis W Contrast  09/06/2015   CLINICAL DATA:  Constipation for few days. Some relief with laxative. Lower abdominal pain.  EXAM: CT ABDOMEN AND PELVIS WITH CONTRAST  TECHNIQUE: Multidetector CT imaging of the abdomen and pelvis was performed using the standard protocol following bolus administration of intravenous contrast.  CONTRAST:  OMNIPAQUE IOHEXOL 300 MG/ML  SOLN  COMPARISON:  None.  FINDINGS: Lower chest: Heart size is normal. No imaged pericardial effusion or significant coronary artery calcifications. No pulmonary nodules, pleural effusions, or infiltrates.  Upper abdomen: No focal abnormality identified within the liver, spleen, pancreas, or adrenal glands. Gallbladder is present.  Gastrointestinal tract: Stomach and small bowel loops are normal in appearance. The appendix is not well seen. There is focal inflammation associated with diverticular disease at the junction of the descending and sigmoid colon. No evidence for perforation or abscess.  Pelvis: The urinary bladder, prostate gland, and seminal vesicles have a normal appearance. No free pelvic fluid.  Retroperitoneum: There is atherosclerotic calcification of the abdominal aorta. Mildly enlarged right internal iliac lymph node is 0.8 cm in short axis dimension.  Abdominal wall: Unremarkable.  Osseous structures: Unremarkable.  IMPRESSION: 1. Acute diverticulitis involving the junction of the descending and sigmoid colon. No associated abscess or perforation. 2. No bowel obstruction. 3. Abdominal aortic atherosclerosis. 4. Mildly prominent right internal iliac lymph node. Although nonspecific, correlation with PSA is recommended.   Electronically Signed   By: Norva Pavlov M.D.   On: 09/06/2015 12:17   I have personally reviewed and evaluated these lab results as part of my medical  decision-making.   EKG Interpretation None     Medications  iohexol (OMNIPAQUE) 300 MG/ML solution 25 mL (25 mLs Oral Contrast Given 09/06/15 1003)  iohexol (OMNIPAQUE) 300 MG/ML solution 100 mL (100 mLs Intravenous Contrast Given 09/06/15 1150)  metroNIDAZOLE (FLAGYL) tablet 500 mg (500 mg Oral Given 09/06/15 1239)  ciprofloxacin (CIPRO) tablet 500 mg (500 mg Oral Given 09/06/15 1239)    MDM   Final diagnoses:  Diverticulitis of large intestine without perforation or abscess without bleeding   64 year old male with history of constipation who presents with 3 days of left lower quadrant abdominal pain not relieved by laxitives. Patient well-appearing with reassuring VS at presentation. LLQ, RLQ, and suprapubic tenderness with no peritonitis on exam. Pt comfortable otherwise. Obtained above lab work and ordered CT of the abdomen and pelvis to evaluate for diverticulitis, appendicitis, or other acute intra-abdominal process.  Lab work was unremarkable. CT showed acute diverticulitis of descending and sigmoid colon without abscess or perforation. Gave the patient a dose of Cipro and Flagyl in the emergency department after which he was able to eat a meal without difficulty. I instructed him on supportive care at home provided him with prescriptions for Cipro, Flagyl, Lortab, and Zofran. instructed him to establish care at [facility] clinic. Return precautions reviewed including worsening symptoms, fever, or blood in his stool. Patient has forced understanding and discharged in satisfactory condition.  Laurence Spates, MD 09/06/15 [phone]

## 2015-09-06 NOTE — ED Notes (Signed)
Pt transported to CT ?

## 2015-09-07 ENCOUNTER — Ambulatory Visit: Payer: Medicare Other | Attending: Physician Assistant | Admitting: Physician Assistant

## 2015-09-07 VITALS — BP 136/75 | HR 71 | Temp 98.1°F | Resp 18 | Ht 70.5 in | Wt 181.6 lb

## 2015-09-07 DIAGNOSIS — K5732 Diverticulitis of large intestine without perforation or abscess without bleeding: Secondary | ICD-10-CM | POA: Insufficient documentation

## 2015-09-07 MED ORDER — NICOTINE 14 MG/24HR TD PT24: 14.0000 mg | MEDICATED_PATCH | Freq: Every day | TRANSDERMAL | Status: DC

## 2015-09-07 MED ORDER — AMLODIPINE BESY-BENAZEPRIL HCL 10-20 MG PO CAPS: 1.0000 | ORAL_CAPSULE | Freq: Every day | ORAL | Status: DC

## 2015-09-07 NOTE — Patient Instructions (Signed)
Complete the full course of antibiotics Please call us or go to the Emergency Room if you have fever or chills or worsening pain Please return for your follow up appointment

## 2015-09-07 NOTE — Progress Notes (Signed)
James Riley  UEA:[id number]  YNW:[id number]  DOB - 21-Feb-1951  Chief Complaint  Patient presents with  . Follow-up  . Abdominal Pain       Subjective:   James Riley is a 64 y.o. male here today for establishment of care. He presented to the hospital on yesterday with left lower abdominal pain that is been going on for 3 days. It was nonradiating. Constant. Worse with movement. He thought he was constipated and took a laxative with good bowel movement results. However, his symptoms persisted. His labs in the emergency department looked okay including amylase and lipase. A CT scan of the abdomen and pelvis revealed acute diverticulitis. He was discharged home on Metronidazole, Cipro, pain medications and medication for nausea as needed. His symptoms are still about the same as of now. He has not had fever or chills. He did get his prescriptions field.  He also is interested in smoking cessation. He estimates that he smokes somewhere between 1-2 packs per day.  ROS: GEN: denies fever or chills, denies change in weight Skin: denies lesions or rashes HEENT: denies headache, earache, epistaxis, sore throat, or neck pain LUNGS: denies SHOB, dyspnea, PND, orthopnea CV: denies CP or palpitations ABD: positive abd pain, N or V EXT: denies muscle spasms or swelling; no pain in lower ext, no weakness NEURO: denies numbness or tingling, denies sz, stroke or TIA  Problem  Diverticulitis of Colon Without Hemorrhage    ALLERGIES: No Known Allergies  PAST MEDICAL HISTORY: Past Medical History  Diagnosis Date  . Hypertension   . Hypercholesterolemia     PAST SURGICAL HISTORY: History reviewed. No pertinent past surgical history.  MEDICATIONS AT HOME: Prior to Admission medications   Medication Sig Start Date End Date Taking? Authorizing Provider  amLODipine-benazepril (LOTREL) 10-20 MG capsule Take 1 capsule by mouth daily. 09/07/15  Yes Alanys Godino Netta Cedars, PA-C  ciprofloxacin  (CIPRO) 500 MG tablet Take 1 tablet (500 mg total) by mouth 2 (two) times daily. 09/06/15  Yes Ambrose Finland Little, MD  docusate sodium (COLACE) 100 MG capsule Take 1 capsule (100 mg total) by mouth every 12 (twelve) hours. 09/06/15  Yes Laurence Spates, MD  HYDROcodone-acetaminophen (NORCO/VICODIN) 5-325 MG tablet Take 1-2 tablets by mouth every 6 (six) hours as needed for severe pain. 09/06/15  Yes Laurence Spates, MD  metroNIDAZOLE (FLAGYL) 500 MG tablet Take 1 tablet (500 mg total) by mouth 2 (two) times daily. 09/06/15   Laurence Spates, MD  nicotine (NICODERM CQ - DOSED IN MG/24 HOURS) 14 mg/24hr patch Place 1 patch (14 mg total) onto the skin daily. 09/07/15   Vivianne Master, PA-C     Objective:   Filed Vitals:   09/07/15 0906  BP: 136/75  Pulse: 71  Temp: 98.1 F (36.7 C)  TempSrc: Oral  Resp: 18  Height: 5' 10.5" (1.791 m)  Weight: 181 lb 9.6 oz (82.373 kg)  SpO2: 98%    Exam General appearance : Awake, alert, not in any distress. Speech Clear. Not toxic looking HEENT: Atraumatic and Normocephalic, pupils equally reactive to light and accomodation Neck: supple, no JVD. No cervical lymphadenopathy.  Chest:Good air entry bilaterally, no added sounds  CVS: S1 S2 regular, no murmurs.  Abdomen: Bowel sounds present, Non tender and not distended with no gaurding, rigidity or rebound. Extremities: B/L Lower Ext shows no edema, both legs are warm to touch Neurology: Awake alert, and oriented X 3, CN II-XII intact, Non focal Skin:No Rash Wounds:N/A  Data Review Lab Results  Component Value Date   HGBA1C 6.0 07/14/2010   HGBA1C 6.4 04/18/2010   HGBA1C  01/03/2010    5.8 (NOTE) The ADA recommends the following therapeutic goal for glycemic control related to Hgb A1c measurement: Goal of therapy: <6.5 Hgb A1c  Reference: American Diabetes Association: Clinical Practice Recommendations 2010, Diabetes Care, 2010, 33: (Suppl  1).     Assessment & Plan  1. Acute  Diverticulitis  -Cont Cipro, Flagyl and prn Pain meds  -Bowel rest, hydrate  -monitor for fever , chills or worsening pain 2. HTN  -refill Lotrel  3. Smoker  -cessation discussed  -Nicotine patch   Return in about 2 weeks (around 09/21/2015) for routine health maintenance.  The patient was given clear instructions to go to ER or return to medical center if symptoms don't improve, worsen or new problems develop. The patient verbalized understanding. The patient was told to call to get lab results if they haven't heard anything in the next week.   This note has been created with Education officer, environmental. Any transcriptional errors are unintentional.    Scot Jun, PA-C [facility] [place], [place] [phone]   09/07/2015, 9:32 AM

## 2015-09-07 NOTE — Progress Notes (Signed)
Patient here for ED F/U for diverticulitis. Pt reports pain today in lower left side of abdomen rated at a 8, described as tender. Pt reports the area is sensitive to the touch. Pain has been present since last Saturday. Patient reports the pain causes him to have a hard time getting up out of his car. Pain is constant. Pt was released from hospital yesterday. Pt reports he has been feeling fine since he has been out of the hospital except for that pain. Pt denies any N/V. Pt has taken his medications this morning.

## 2015-09-14 ENCOUNTER — Ambulatory Visit: Payer: Medicare Other | Attending: Internal Medicine | Admitting: Internal Medicine

## 2015-09-14 ENCOUNTER — Encounter: Payer: Self-pay | Admitting: Internal Medicine

## 2015-09-14 VITALS — BP 120/68 | HR 65 | Temp 98.0°F | Resp 16 | Ht 71.0 in | Wt 182.4 lb

## 2015-09-14 DIAGNOSIS — Z2821 Immunization not carried out because of patient refusal: Secondary | ICD-10-CM | POA: Diagnosis not present

## 2015-09-14 DIAGNOSIS — K5732 Diverticulitis of large intestine without perforation or abscess without bleeding: Secondary | ICD-10-CM

## 2015-09-14 DIAGNOSIS — Z789 Other specified health status: Secondary | ICD-10-CM | POA: Diagnosis not present

## 2015-09-14 NOTE — Progress Notes (Signed)
Patient ID: James GaussRonald W Riley, male   DOB: 01/19/1951, 64 y.o.   MRN: [id number]  CC: diverticulitis   HPI: James Riley is a 64 y.o. male here today for a follow up visit.  Patient has past medical history of HTN and diverticulitis. Patient was recently discharged from the ER 8 days ago after being diagnosed with diverticulitis. He was given dual antibiotics, nausea medication, and pain medication. Today he reports that he has completed all antibiotics and the pain is now resolving. He reports that he has been using a stool softener daily. His last colonoscopy was 3 months ago. He also reports that since his stroke 3 years ago he has been having problems with erections. He has been seen by Dr. Patsi Searsannenbaum at [facility] and was given sildenafil to take as needed. He has had a prostate exam as well.    No Known Allergies Past Medical History  Diagnosis Date  . Hypertension   . Hypercholesterolemia    Current Outpatient Prescriptions on File Prior to Visit  Medication Sig Dispense Refill  . amLODipine-benazepril (LOTREL) 10-20 MG capsule Take 1 capsule by mouth daily. 30 capsule 6  . docusate sodium (COLACE) 100 MG capsule Take 1 capsule (100 mg total) by mouth every 12 (twelve) hours. 60 capsule 0  . ciprofloxacin (CIPRO) 500 MG tablet Take 1 tablet (500 mg total) by mouth 2 (two) times daily. (Patient not taking: Reported on 09/14/2015) 14 tablet 0  . HYDROcodone-acetaminophen (NORCO/VICODIN) 5-325 MG tablet Take 1-2 tablets by mouth every 6 (six) hours as needed for severe pain. (Patient not taking: Reported on 09/14/2015) 12 tablet 0  . metroNIDAZOLE (FLAGYL) 500 MG tablet Take 1 tablet (500 mg total) by mouth 2 (two) times daily. (Patient not taking: Reported on 09/14/2015) 14 tablet 0  . nicotine (NICODERM CQ - DOSED IN MG/24 HOURS) 14 mg/24hr patch Place 1 patch (14 mg total) onto the skin daily. (Patient not taking: Reported on 09/14/2015) 28 patch 0   No current facility-administered  medications on file prior to visit.   History reviewed. No pertinent family history. Social History   Social History  . Marital Status: Divorced    Spouse Name: N/A  . Number of Children: N/A  . Years of Education: N/A   Occupational History  . Not on file.   Social History Main Topics  . Smoking status: Current Every Day Smoker -- 1.00 packs/day    Types: Cigarettes  . Smokeless tobacco: Not on file  . Alcohol Use: No  . Drug Use: No  . Sexual Activity: Not on file   Other Topics Concern  . Not on file   Social History Narrative    Review of Systems  Gastrointestinal: Positive for abdominal pain and constipation. Negative for heartburn, nausea, vomiting, diarrhea and blood in stool.  Genitourinary:       Erectile dysfunction   All other systems reviewed and are negative.     Objective:   Filed Vitals:   09/14/15 0916  BP: 149/75  Pulse: 65  Temp: 98 F (36.7 C)  Resp: 16    Physical Exam  Constitutional: He is oriented to person, place, and time.  Cardiovascular: Normal rate, regular rhythm and normal heart sounds.   Pulmonary/Chest: Effort normal and breath sounds normal.  Abdominal: Soft. Bowel sounds are normal. There is no tenderness.  Musculoskeletal: He exhibits no edema.  Neurological: He is alert and oriented to person, place, and time.     Lab Results  Component  Value Date   WBC 6.8 09/06/2015   HGB 15.9 09/06/2015   HCT 44.8 09/06/2015   MCV 91.8 09/06/2015   PLT 180 09/06/2015   Lab Results  Component Value Date   CREATININE 1.00 09/06/2015   BUN 7 09/06/2015   NA 138 09/06/2015   K 4.1 09/06/2015   CL 107 09/06/2015   CO2 24 09/06/2015    Lab Results  Component Value Date   HGBA1C 6.0 07/14/2010   Lipid Panel     Component Value Date/Time   CHOL 133 12/05/2010 2136   TRIG 204* 12/05/2010 2136   HDL 37* 12/05/2010 2136   CHOLHDL 3.6 Ratio 12/05/2010 2136   VLDL 41* 12/05/2010 2136   LDLCALC 55 12/05/2010 2136        Assessment and plan:   [name] was seen today for follow-up.  Diagnoses and all orders for this visit:  Diverticulitis of colon without hemorrhage Pain is now resolving. He is up to date on colonoscopy. I have explained that if pain returns and does not resolve he may call me back for a possible GI referral. Went over diet changes   Refused influenza vaccine Educated patient on benefits of vaccination at his age. Also refused pneumonia and Zostavax.      Return in about 3 months (around 12/15/2015) for Hypertension.   Ambrose Finland, NP-C [facility] [phone] 09/14/2015, 9:23 AM

## 2015-09-14 NOTE — Patient Instructions (Signed)
Diverticulitis °Diverticulitis is inflammation or infection of small pouches in your colon that form when you have a condition called diverticulosis. The pouches in your colon are called diverticula. Your colon, or large intestine, is where water is absorbed and stool is formed. °Complications of diverticulitis can include: °· Bleeding. °· Severe infection. °· Severe pain. °· Perforation of your colon. °· Obstruction of your colon. °CAUSES  °Diverticulitis is caused by bacteria. °Diverticulitis happens when stool becomes trapped in diverticula. This allows bacteria to grow in the diverticula, which can lead to inflammation and infection. °RISK FACTORS °People with diverticulosis are at risk for diverticulitis. Eating a diet that does not include enough fiber from fruits and vegetables may make diverticulitis more likely to develop. °SYMPTOMS  °Symptoms of diverticulitis may include: °· Abdominal pain and tenderness. The pain is normally located on the left side of the abdomen, but may occur in other areas. °· Fever and chills. °· Bloating. °· Cramping. °· Nausea. °· Vomiting. °· Constipation. °· Diarrhea. °· Blood in your stool. °DIAGNOSIS  °Your health care provider will ask you about your medical history and do a physical exam. You may need to have tests done because many medical conditions can cause the same symptoms as diverticulitis. Tests may include: °· Blood tests. °· Urine tests. °· Imaging tests of the abdomen, including X-rays and CT scans. °When your condition is under control, your health care provider may recommend that you have a colonoscopy. A colonoscopy can show how severe your diverticula are and whether something else is causing your symptoms. °TREATMENT  °Most cases of diverticulitis are mild and can be treated at home. Treatment may include: °· Taking over-the-counter pain medicines. °· Following a clear liquid diet. °· Taking antibiotic medicines by mouth for 7-10 days. °More severe cases may  be treated at a hospital. Treatment may include: °· Not eating or drinking. °· Taking prescription pain medicine. °· Receiving antibiotic medicines through an IV tube. °· Receiving fluids and nutrition through an IV tube. °· Surgery. °HOME CARE INSTRUCTIONS  °· Follow your health care provider's instructions carefully. °· Follow a full liquid diet or other diet as directed by your health care provider. After your symptoms improve, your health care provider may tell you to change your diet. He or she may recommend you eat a high-fiber diet. Fruits and vegetables are good sources of fiber. Fiber makes it easier to pass stool. °· Take fiber supplements or probiotics as directed by your health care provider. °· Only take medicines as directed by your health care provider. °· Keep all your follow-up appointments. °SEEK MEDICAL CARE IF:  °· Your pain does not improve. °· You have a hard time eating food. °· Your bowel movements do not return to normal. °SEEK IMMEDIATE MEDICAL CARE IF:  °· Your pain becomes worse. °· Your symptoms do not get better. °· Your symptoms suddenly get worse. °· You have a fever. °· You have repeated vomiting. °· You have bloody or black, tarry stools. °MAKE SURE YOU:  °· Understand these instructions. °· Will watch your condition. °· Will get help right away if you are not doing well or get worse. °  °This information is not intended to replace advice given to you by your health care provider. Make sure you discuss any questions you have with your health care provider. °  °Document Released: 08/29/2005 Document Revised: 11/24/2013 Document Reviewed: 10/14/2013 °Elsevier Interactive Patient Education ©2016 [employer] ° °

## 2015-09-14 NOTE — Progress Notes (Signed)
Patient states he is here for follow up from the ED Patient was having lower left sided abd pain Patient was diagnosed with diverticulitis and has since Finished his ABT Patient has declined flu vaccine

## 2015-11-09 DIAGNOSIS — Z23 Encounter for immunization: Secondary | ICD-10-CM | POA: Diagnosis not present

## 2016-01-04 ENCOUNTER — Ambulatory Visit: Payer: Medicare Other | Attending: Internal Medicine | Admitting: Internal Medicine

## 2016-01-04 ENCOUNTER — Encounter: Payer: Self-pay | Admitting: Internal Medicine

## 2016-01-04 VITALS — BP 128/68 | HR 85 | Temp 98.0°F | Resp 16 | Wt 186.0 lb

## 2016-01-04 DIAGNOSIS — L02811 Cutaneous abscess of head [any part, except face]: Secondary | ICD-10-CM | POA: Diagnosis not present

## 2016-01-04 DIAGNOSIS — I10 Essential (primary) hypertension: Secondary | ICD-10-CM | POA: Diagnosis not present

## 2016-01-04 DIAGNOSIS — F17210 Nicotine dependence, cigarettes, uncomplicated: Secondary | ICD-10-CM | POA: Insufficient documentation

## 2016-01-04 DIAGNOSIS — F17200 Nicotine dependence, unspecified, uncomplicated: Secondary | ICD-10-CM

## 2016-01-04 DIAGNOSIS — E7800 Pure hypercholesterolemia, unspecified: Secondary | ICD-10-CM | POA: Insufficient documentation

## 2016-01-04 DIAGNOSIS — Z8673 Personal history of transient ischemic attack (TIA), and cerebral infarction without residual deficits: Secondary | ICD-10-CM | POA: Insufficient documentation

## 2016-01-04 DIAGNOSIS — K5900 Constipation, unspecified: Secondary | ICD-10-CM | POA: Diagnosis not present

## 2016-01-04 DIAGNOSIS — Z79899 Other long term (current) drug therapy: Secondary | ICD-10-CM | POA: Insufficient documentation

## 2016-01-04 MED ORDER — CEPHALEXIN 500 MG PO CAPS: 500.0000 mg | ORAL_CAPSULE | Freq: Two times a day (BID) | ORAL | Status: DC

## 2016-01-04 MED ORDER — POLYETHYLENE GLYCOL 3350 17 GM/SCOOP PO POWD: 17.0000 g | Freq: Every day | ORAL | Status: DC

## 2016-01-04 MED ORDER — DOCUSATE SODIUM 100 MG PO CAPS: 100.0000 mg | ORAL_CAPSULE | Freq: Every evening | ORAL | Status: DC | PRN

## 2016-01-04 MED ORDER — AMLODIPINE BESY-BENAZEPRIL HCL 10-20 MG PO CAPS: 1.0000 | ORAL_CAPSULE | Freq: Every day | ORAL | Status: DC

## 2016-01-04 NOTE — Progress Notes (Signed)
Patient ID: James Riley, male   DOB: 10-16-1951, 65 y.o.   MRN: [id number]  CC: HTN, constipation  HPI: James Riley is a 66 y.o. male here today for a follow up visit.  Patient has past medical history of CVA, HTN, and HLD. Patient reports that he takes his blood pressure medication daily without complications. He denies complications of cough, edema, headaches, chest pain, palpitations. He is concerned about having hard stools. Only has a bowel movement every 3-4 days and it is painful. Has tried Milk of Magnesia and magnesium citrate. No blood in stool.  Patient also reports that for the past 4 months he has been having hard areas on his scalp that often become swollen and painful. He has never had drainage from the area.   No Known Allergies Past Medical History  Diagnosis Date  . Hypertension   . Hypercholesterolemia    Current Outpatient Prescriptions on File Prior to Visit  Medication Sig Dispense Refill  . amLODipine-benazepril (LOTREL) 10-20 MG capsule Take 1 capsule by mouth daily. 30 capsule 6  . docusate sodium (COLACE) 100 MG capsule Take 1 capsule (100 mg total) by mouth every 12 (twelve) hours. 60 capsule 0  . ciprofloxacin (CIPRO) 500 MG tablet Take 1 tablet (500 mg total) by mouth 2 (two) times daily. (Patient not taking: Reported on 09/14/2015) 14 tablet 0  . HYDROcodone-acetaminophen (NORCO/VICODIN) 5-325 MG tablet Take 1-2 tablets by mouth every 6 (six) hours as needed for severe pain. (Patient not taking: Reported on 09/14/2015) 12 tablet 0  . metroNIDAZOLE (FLAGYL) 500 MG tablet Take 1 tablet (500 mg total) by mouth 2 (two) times daily. (Patient not taking: Reported on 09/14/2015) 14 tablet 0  . nicotine (NICODERM CQ - DOSED IN MG/24 HOURS) 14 mg/24hr patch Place 1 patch (14 mg total) onto the skin daily. (Patient not taking: Reported on 09/14/2015) 28 patch 0   No current facility-administered medications on file prior to visit.   History reviewed. No pertinent  family history. Social History   Social History  . Marital Status: Divorced    Spouse Name: N/A  . Number of Children: N/A  . Years of Education: N/A   Occupational History  . Not on file.   Social History Main Topics  . Smoking status: Current Every Day Smoker -- 1.00 packs/day    Types: Cigarettes  . Smokeless tobacco: Not on file  . Alcohol Use: No  . Drug Use: No  . Sexual Activity: Not on file   Other Topics Concern  . Not on file   Social History Narrative    Review of Systems: Other than what is stated in HPI, all other systems are negative.   Objective:   Filed Vitals:   01/04/16 1150  BP: 158/79  Pulse: 85  Temp: 98 F (36.7 C)  Resp: 16    Physical Exam  Constitutional: He is oriented to person, place, and time.  Neck: No JVD present.  Cardiovascular: Normal rate, regular rhythm and normal heart sounds.   Pulmonary/Chest: Effort normal and breath sounds normal.  Abdominal: Soft. Bowel sounds are normal. He exhibits no distension. There is no tenderness.  Musculoskeletal: He exhibits no edema.  Neurological: He is alert and oriented to person, place, and time.  Skin: No erythema.  Several hard, TTP areas on crown of head     Lab Results  Component Value Date   WBC 6.8 09/06/2015   HGB 15.9 09/06/2015   HCT 44.8 09/06/2015   MCV  91.8 09/06/2015   PLT 180 09/06/2015   Lab Results  Component Value Date   CREATININE 1.00 09/06/2015   BUN 7 09/06/2015   NA 138 09/06/2015   K 4.1 09/06/2015   CL 107 09/06/2015   CO2 24 09/06/2015    Lab Results  Component Value Date   HGBA1C 6.0 07/14/2010   Lipid Panel     Component Value Date/Time   CHOL 133 12/05/2010 2136   TRIG 204* 12/05/2010 2136   HDL 37* 12/05/2010 2136   CHOLHDL 3.6 Ratio 12/05/2010 2136   VLDL 41* 12/05/2010 2136   LDLCALC 55 12/05/2010 2136       Assessment and plan:   James Riley was seen today for follow-up.  Diagnoses and all orders for this visit:  Essential  hypertension -     amLODipine-benazepril (LOTREL) 10-20 MG capsule; Take 1 capsule by mouth daily. Patient blood pressure is stable and may continue on current medication.  Education on diet, exercise, and modifiable risk factors discussed. Will obtain appropriate labs as needed. Will follow up in 3-6 months.   Constipation, unspecified constipation type -     docusate sodium (COLACE) 100 MG capsule; Take 1 capsule (100 mg total) by mouth at bedtime as needed for mild constipation. -     polyethylene glycol powder (GLYCOLAX/MIRALAX) powder; Take 17 g by mouth daily. Explained that the daily recommended amount of fiber is 25 g, went over high fiber foods, encourage increased water intake, miralax use, and increased physical activity.  Patient given constipation handout.   Tobacco use disorder Smoking cessation discussed for 3 minutes, patient is not willing to quit at this time. Will continue to assess on each visit. Discussed increased risk for diseases such as cancer, heart disease, and stroke.   Abscess, scalp -     Begin cephALEXin (KEFLEX) 500 MG capsule; Take 1 capsule (500 mg total) by mouth 2 (two) times daily. If no improvement, he may call back after completion on antibiotics for a Dermatology referral  Return in about 3 months (around 04/02/2016) for Hypertension.       Ambrose Finland, NP-C [facility] [phone] 01/04/2016, 12:03 PM

## 2016-01-04 NOTE — Progress Notes (Signed)
Patient here for follow up on his HTN Complains of a couple of "boils" on the top of his head and also Having trouble with his bowel movements  Complains of stool being very hard and hard to pass

## 2016-08-20 DIAGNOSIS — Z23 Encounter for immunization: Secondary | ICD-10-CM | POA: Diagnosis not present

## 2016-09-07 ENCOUNTER — Encounter (HOSPITAL_COMMUNITY): Payer: Self-pay | Admitting: *Deleted

## 2016-09-07 ENCOUNTER — Emergency Department (HOSPITAL_COMMUNITY): Payer: Medicare Other | Admitting: Anesthesiology

## 2016-09-07 ENCOUNTER — Emergency Department (HOSPITAL_COMMUNITY): Payer: Medicare Other

## 2016-09-07 ENCOUNTER — Inpatient Hospital Stay (HOSPITAL_COMMUNITY)
Admission: EM | Admit: 2016-09-07 | Discharge: 2016-09-11 | DRG: 340 | Disposition: A | Discharge status: Home / Self Care | Payer: Medicare Other | Attending: Surgery | Admitting: Surgery

## 2016-09-07 ENCOUNTER — Encounter (HOSPITAL_COMMUNITY): Admission: EM | Disposition: A | Discharge status: Home / Self Care | Payer: Self-pay | Source: Home / Self Care

## 2016-09-07 DIAGNOSIS — K353 Acute appendicitis with localized peritonitis: Secondary | ICD-10-CM | POA: Diagnosis not present

## 2016-09-07 DIAGNOSIS — K5900 Constipation, unspecified: Secondary | ICD-10-CM | POA: Diagnosis present

## 2016-09-07 DIAGNOSIS — K381 Appendicular concretions: Secondary | ICD-10-CM | POA: Diagnosis present

## 2016-09-07 DIAGNOSIS — I10 Essential (primary) hypertension: Secondary | ICD-10-CM | POA: Diagnosis present

## 2016-09-07 DIAGNOSIS — Z23 Encounter for immunization: Secondary | ICD-10-CM | POA: Diagnosis not present

## 2016-09-07 DIAGNOSIS — F17210 Nicotine dependence, cigarettes, uncomplicated: Secondary | ICD-10-CM | POA: Diagnosis present

## 2016-09-07 DIAGNOSIS — E7800 Pure hypercholesterolemia, unspecified: Secondary | ICD-10-CM | POA: Diagnosis present

## 2016-09-07 DIAGNOSIS — Z8673 Personal history of transient ischemic attack (TIA), and cerebral infarction without residual deficits: Secondary | ICD-10-CM

## 2016-09-07 DIAGNOSIS — K3580 Unspecified acute appendicitis: Secondary | ICD-10-CM | POA: Diagnosis not present

## 2016-09-07 DIAGNOSIS — K37 Unspecified appendicitis: Secondary | ICD-10-CM | POA: Diagnosis not present

## 2016-09-07 DIAGNOSIS — R1030 Lower abdominal pain, unspecified: Secondary | ICD-10-CM | POA: Diagnosis not present

## 2016-09-07 DIAGNOSIS — K352 Acute appendicitis with generalized peritonitis: Secondary | ICD-10-CM | POA: Diagnosis not present

## 2016-09-07 DIAGNOSIS — N200 Calculus of kidney: Secondary | ICD-10-CM | POA: Diagnosis not present

## 2016-09-07 DIAGNOSIS — K3532 Acute appendicitis with perforation and localized peritonitis, without abscess: Secondary | ICD-10-CM | POA: Diagnosis present

## 2016-09-07 DIAGNOSIS — Z5331 Laparoscopic surgical procedure converted to open procedure: Secondary | ICD-10-CM | POA: Diagnosis not present

## 2016-09-07 HISTORY — PX: LAPAROSCOPIC APPENDECTOMY: SHX408

## 2016-09-07 HISTORY — DX: Cerebral infarction, unspecified: I63.9

## 2016-09-07 LAB — URINALYSIS, ROUTINE W REFLEX MICROSCOPIC
BILIRUBIN URINE: NEGATIVE
GLUCOSE, UA: NEGATIVE mg/dL
KETONES UR: NEGATIVE mg/dL
LEUKOCYTES UA: NEGATIVE
Nitrite: NEGATIVE
PH: 5.5 (ref 5.0–8.0)
Protein, ur: NEGATIVE mg/dL
Specific Gravity, Urine: <1.005 — ABNORMAL LOW (ref 1.005–1.030)

## 2016-09-07 LAB — COMPREHENSIVE METABOLIC PANEL
ALT: 18 U/L (ref 17–63)
AST: 20 U/L (ref 15–41)
Albumin: 4.5 g/dL (ref 3.5–5.0)
Alkaline Phosphatase: 81 U/L (ref 38–126)
Anion gap: 11 (ref 5–15)
BILIRUBIN TOTAL: 1 mg/dL (ref 0.3–1.2)
BUN: 6 mg/dL (ref 6–20)
CALCIUM: 9.9 mg/dL (ref 8.9–10.3)
CO2: 24 mmol/L (ref 22–32)
CREATININE: 0.81 mg/dL (ref 0.61–1.24)
Chloride: 102 mmol/L (ref 101–111)
Glucose, Bld: 132 mg/dL — ABNORMAL HIGH (ref 65–99)
Potassium: 3.8 mmol/L (ref 3.5–5.1)
Sodium: 137 mmol/L (ref 135–145)
TOTAL PROTEIN: 8.9 g/dL — AB (ref 6.5–8.1)

## 2016-09-07 LAB — CBC
HCT: 46.4 % (ref 39.0–52.0)
Hemoglobin: 16.8 g/dL (ref 13.0–17.0)
MCH: 32.7 pg (ref 26.0–34.0)
MCHC: 36.2 g/dL — ABNORMAL HIGH (ref 30.0–36.0)
MCV: 90.4 fL (ref 78.0–100.0)
Platelets: 218 10*3/uL (ref 150–400)
RBC: 5.13 MIL/uL (ref 4.22–5.81)
RDW: 13.7 % (ref 11.5–15.5)
WBC: 15.4 10*3/uL — AB (ref 4.0–10.5)

## 2016-09-07 LAB — LIPASE, BLOOD: Lipase: 17 U/L (ref 11–51)

## 2016-09-07 LAB — URINE MICROSCOPIC-ADD ON

## 2016-09-07 SURGERY — APPENDECTOMY, LAPAROSCOPIC
Anesthesia: General | Site: Abdomen

## 2016-09-07 MED ORDER — LACTATED RINGERS IV SOLN
INTRAVENOUS | Status: DC
  Administered 2016-09-07: 15:00:00 via INTRAVENOUS

## 2016-09-07 MED ORDER — SODIUM CHLORIDE 0.9 % IV BOLUS (SEPSIS): 1000.0000 mL | Freq: Once | INTRAVENOUS | Status: DC

## 2016-09-07 MED ORDER — PROPOFOL 10 MG/ML IV BOLUS
INTRAVENOUS | Status: DC | PRN
  Administered 2016-09-07: 30 mg via INTRAVENOUS
  Administered 2016-09-07: 250 mg via INTRAVENOUS

## 2016-09-07 MED ORDER — POTASSIUM CHLORIDE IN NACL 20-0.9 MEQ/L-% IV SOLN
INTRAVENOUS | Status: DC
  Administered 2016-09-07 – 2016-09-09 (×5): via INTRAVENOUS
  Filled 2016-09-07 (×5): qty 1000

## 2016-09-07 MED ORDER — FENTANYL CITRATE (PF) 100 MCG/2ML IJ SOLN
INTRAMUSCULAR | Status: DC | PRN
  Administered 2016-09-07: 50 ug via INTRAVENOUS
  Administered 2016-09-07 (×3): 100 ug via INTRAVENOUS
  Administered 2016-09-07: 50 ug via INTRAVENOUS

## 2016-09-07 MED ORDER — MIDAZOLAM HCL 5 MG/5ML IJ SOLN
INTRAMUSCULAR | Status: DC | PRN
  Administered 2016-09-07: 2 mg via INTRAVENOUS

## 2016-09-07 MED ORDER — ONDANSETRON HCL 4 MG/2ML IJ SOLN: 4.0000 mg | Freq: Four times a day (QID) | INTRAMUSCULAR | Status: DC | PRN

## 2016-09-07 MED ORDER — DEXAMETHASONE SODIUM PHOSPHATE 10 MG/ML IJ SOLN
INTRAMUSCULAR | Status: AC
  Filled 2016-09-07: qty 1

## 2016-09-07 MED ORDER — ACETAMINOPHEN 325 MG PO TABS
650.0000 mg | ORAL_TABLET | Freq: Four times a day (QID) | ORAL | Status: DC | PRN
  Administered 2016-09-08 – 2016-09-10 (×4): 650 mg via ORAL
  Filled 2016-09-07 (×4): qty 2

## 2016-09-07 MED ORDER — PIPERACILLIN-TAZOBACTAM 3.375 G IVPB
3.3750 g | Freq: Three times a day (TID) | INTRAVENOUS | Status: DC
  Administered 2016-09-07 – 2016-09-10 (×9): 3.375 g via INTRAVENOUS
  Filled 2016-09-07 (×13): qty 50

## 2016-09-07 MED ORDER — PROMETHAZINE HCL 25 MG/ML IJ SOLN: 6.2500 mg | INTRAMUSCULAR | Status: DC | PRN

## 2016-09-07 MED ORDER — AMLODIPINE BESYLATE 10 MG PO TABS
10.0000 mg | ORAL_TABLET | Freq: Every day | ORAL | Status: DC
  Administered 2016-09-07 – 2016-09-11 (×5): 10 mg via ORAL
  Filled 2016-09-07 (×6): qty 1

## 2016-09-07 MED ORDER — FENTANYL CITRATE (PF) 100 MCG/2ML IJ SOLN
INTRAMUSCULAR | Status: AC
  Filled 2016-09-07: qty 2

## 2016-09-07 MED ORDER — LIDOCAINE 2% (20 MG/ML) 5 ML SYRINGE
INTRAMUSCULAR | Status: AC
  Filled 2016-09-07: qty 5

## 2016-09-07 MED ORDER — OXYCODONE HCL 5 MG PO TABS
5.0000 mg | ORAL_TABLET | ORAL | Status: DC | PRN
  Administered 2016-09-07: 10 mg via ORAL
  Administered 2016-09-08: 5 mg via ORAL
  Administered 2016-09-08 – 2016-09-10 (×5): 10 mg via ORAL
  Filled 2016-09-07: qty 1
  Filled 2016-09-07 (×2): qty 2
  Filled 2016-09-07: qty 1
  Filled 2016-09-07 (×5): qty 2

## 2016-09-07 MED ORDER — DIPHENHYDRAMINE HCL 50 MG/ML IJ SOLN: 25.0000 mg | Freq: Four times a day (QID) | INTRAMUSCULAR | Status: DC | PRN

## 2016-09-07 MED ORDER — NICOTINE 21 MG/24HR TD PT24
21.0000 mg | MEDICATED_PATCH | Freq: Once | TRANSDERMAL | Status: AC
  Administered 2016-09-07: 21 mg via TRANSDERMAL
  Filled 2016-09-07: qty 1

## 2016-09-07 MED ORDER — LIDOCAINE HCL (CARDIAC) 20 MG/ML IV SOLN
INTRAVENOUS | Status: DC | PRN
  Administered 2016-09-07: 80 mg via INTRAVENOUS
  Administered 2016-09-07: 70 mg via INTRAVENOUS

## 2016-09-07 MED ORDER — FENTANYL CITRATE (PF) 100 MCG/2ML IJ SOLN: 25.0000 ug | INTRAMUSCULAR | Status: DC | PRN

## 2016-09-07 MED ORDER — ACETAMINOPHEN 650 MG RE SUPP: 650.0000 mg | Freq: Four times a day (QID) | RECTAL | Status: DC | PRN

## 2016-09-07 MED ORDER — MIDAZOLAM HCL 2 MG/2ML IJ SOLN
INTRAMUSCULAR | Status: AC
  Filled 2016-09-07: qty 2

## 2016-09-07 MED ORDER — SODIUM CHLORIDE 0.9 % IV BOLUS (SEPSIS)
1000.0000 mL | Freq: Once | INTRAVENOUS | Status: AC
  Administered 2016-09-07: 1000 mL via INTRAVENOUS

## 2016-09-07 MED ORDER — LACTATED RINGERS IV SOLN
INTRAVENOUS | Status: DC | PRN
  Administered 2016-09-07 (×2): via INTRAVENOUS

## 2016-09-07 MED ORDER — BENAZEPRIL HCL 20 MG PO TABS
20.0000 mg | ORAL_TABLET | Freq: Every day | ORAL | Status: DC
  Administered 2016-09-07 – 2016-09-11 (×5): 20 mg via ORAL
  Filled 2016-09-07 (×5): qty 1

## 2016-09-07 MED ORDER — SUCCINYLCHOLINE CHLORIDE 20 MG/ML IJ SOLN
INTRAMUSCULAR | Status: DC | PRN
  Administered 2016-09-07: 120 mg via INTRAVENOUS

## 2016-09-07 MED ORDER — ONDANSETRON HCL 4 MG/2ML IJ SOLN
INTRAMUSCULAR | Status: AC
  Filled 2016-09-07: qty 2

## 2016-09-07 MED ORDER — ARTIFICIAL TEARS OP OINT
TOPICAL_OINTMENT | OPHTHALMIC | Status: DC | PRN
  Administered 2016-09-07: 1 via OPHTHALMIC

## 2016-09-07 MED ORDER — PIPERACILLIN-TAZOBACTAM 3.375 G IVPB 30 MIN
3.3750 g | Freq: Once | INTRAVENOUS | Status: AC
  Administered 2016-09-07: 3.375 g via INTRAVENOUS
  Filled 2016-09-07: qty 50

## 2016-09-07 MED ORDER — DIPHENHYDRAMINE HCL 25 MG PO CAPS
25.0000 mg | ORAL_CAPSULE | Freq: Four times a day (QID) | ORAL | Status: DC | PRN
  Administered 2016-09-09 – 2016-09-10 (×2): 25 mg via ORAL
  Filled 2016-09-07 (×2): qty 1

## 2016-09-07 MED ORDER — SODIUM CHLORIDE 0.9 % IR SOLN
Status: DC | PRN
  Administered 2016-09-07: 1000 mL

## 2016-09-07 MED ORDER — LIDOCAINE 2% (20 MG/ML) 5 ML SYRINGE
INTRAMUSCULAR | Status: AC
  Filled 2016-09-07: qty 15

## 2016-09-07 MED ORDER — 0.9 % SODIUM CHLORIDE (POUR BTL) OPTIME
TOPICAL | Status: DC | PRN
  Administered 2016-09-07: 1000 mL

## 2016-09-07 MED ORDER — PIPERACILLIN-TAZOBACTAM 3.375 G IVPB: 3.3750 g | Freq: Three times a day (TID) | INTRAVENOUS | Status: DC

## 2016-09-07 MED ORDER — ONDANSETRON HCL 4 MG/2ML IJ SOLN
INTRAMUSCULAR | Status: DC | PRN
  Administered 2016-09-07: 4 mg via INTRAVENOUS

## 2016-09-07 MED ORDER — AMLODIPINE BESY-BENAZEPRIL HCL 10-20 MG PO CAPS: 1.0000 | ORAL_CAPSULE | Freq: Every day | ORAL | Status: DC

## 2016-09-07 MED ORDER — ROCURONIUM BROMIDE 10 MG/ML (PF) SYRINGE
PREFILLED_SYRINGE | INTRAVENOUS | Status: AC
  Filled 2016-09-07: qty 30

## 2016-09-07 MED ORDER — ROCURONIUM BROMIDE 100 MG/10ML IV SOLN
INTRAVENOUS | Status: DC | PRN
  Administered 2016-09-07: 50 mg via INTRAVENOUS
  Administered 2016-09-07: 10 mg via INTRAVENOUS

## 2016-09-07 MED ORDER — ARTIFICIAL TEARS OP OINT
TOPICAL_OINTMENT | OPHTHALMIC | Status: AC
  Filled 2016-09-07: qty 3.5

## 2016-09-07 MED ORDER — ONDANSETRON 4 MG PO TBDP: 4.0000 mg | ORAL_TABLET | Freq: Four times a day (QID) | ORAL | Status: DC | PRN

## 2016-09-07 MED ORDER — MORPHINE SULFATE (PF) 2 MG/ML IV SOLN
1.0000 mg | INTRAVENOUS | Status: DC | PRN
  Administered 2016-09-07 – 2016-09-08 (×2): 3 mg via INTRAVENOUS
  Administered 2016-09-09: 4 mg via INTRAVENOUS
  Filled 2016-09-07 (×5): qty 2

## 2016-09-07 MED ORDER — IOPAMIDOL (ISOVUE-300) INJECTION 61%
INTRAVENOUS | Status: AC
  Administered 2016-09-07: 100 mL
  Filled 2016-09-07: qty 100

## 2016-09-07 MED ORDER — BUPIVACAINE-EPINEPHRINE 0.5% -1:200000 IJ SOLN
INTRAMUSCULAR | Status: DC | PRN
  Administered 2016-09-07: 50 mL

## 2016-09-07 MED ORDER — DEXAMETHASONE SODIUM PHOSPHATE 10 MG/ML IJ SOLN
INTRAMUSCULAR | Status: DC | PRN
  Administered 2016-09-07: 10 mg via INTRAVENOUS

## 2016-09-07 MED ORDER — SUGAMMADEX SODIUM 200 MG/2ML IV SOLN
INTRAVENOUS | Status: AC
  Filled 2016-09-07: qty 2

## 2016-09-07 MED ORDER — ENOXAPARIN SODIUM 40 MG/0.4ML ~~LOC~~ SOLN
40.0000 mg | SUBCUTANEOUS | Status: DC
  Administered 2016-09-08 – 2016-09-10 (×3): 40 mg via SUBCUTANEOUS
  Filled 2016-09-07 (×2): qty 0.4

## 2016-09-07 MED ORDER — SUGAMMADEX SODIUM 200 MG/2ML IV SOLN
INTRAVENOUS | Status: DC | PRN
  Administered 2016-09-07: 200 mg via INTRAVENOUS

## 2016-09-07 SURGICAL SUPPLY — 55 items
APPLIER CLIP 5 13 M/L LIGAMAX5 (MISCELLANEOUS)
APPLIER CLIP ROT 10 11.4 M/L (STAPLE)
APR CLP MED LRG 11.4X10 (STAPLE)
APR CLP MED LRG 5 ANG JAW (MISCELLANEOUS)
BAG SPEC RTRVL LRG 6X4 10 (ENDOMECHANICALS) ×1
CANISTER SUCTION 2500CC (MISCELLANEOUS) ×3 IMPLANT
CHLORAPREP W/TINT 26ML (MISCELLANEOUS) ×3 IMPLANT
CLIP APPLIE 5 13 M/L LIGAMAX5 (MISCELLANEOUS) IMPLANT
CLIP APPLIE ROT 10 11.4 M/L (STAPLE) IMPLANT
COVER SURGICAL LIGHT HANDLE (MISCELLANEOUS) ×3 IMPLANT
CUTTER FLEX LINEAR 45M (STAPLE) IMPLANT
DRAIN CHANNEL 19F RND (DRAIN) ×2 IMPLANT
ELECT CAUTERY BLADE 6.4 (BLADE) ×2 IMPLANT
ELECT REM PT RETURN 9FT ADLT (ELECTROSURGICAL) ×3
ELECTRODE REM PT RTRN 9FT ADLT (ELECTROSURGICAL) ×1 IMPLANT
EVACUATOR SILICONE 100CC (DRAIN) ×2 IMPLANT
GLOVE BIO SURGEON STRL SZ7.5 (GLOVE) ×2 IMPLANT
GLOVE BIOGEL PI IND STRL 7.5 (GLOVE) IMPLANT
GLOVE BIOGEL PI INDICATOR 7.5 (GLOVE) ×2
GLOVE SURG SIGNA 7.5 PF LTX (GLOVE) ×3 IMPLANT
GOWN STRL REUS W/ TWL LRG LVL3 (GOWN DISPOSABLE) ×2 IMPLANT
GOWN STRL REUS W/ TWL XL LVL3 (GOWN DISPOSABLE) ×1 IMPLANT
GOWN STRL REUS W/TWL LRG LVL3 (GOWN DISPOSABLE) ×9
GOWN STRL REUS W/TWL XL LVL3 (GOWN DISPOSABLE) ×3
KIT BASIN OR (CUSTOM PROCEDURE TRAY) ×3 IMPLANT
KIT ROOM TURNOVER OR (KITS) ×3 IMPLANT
LIQUID BAND (GAUZE/BANDAGES/DRESSINGS) ×3 IMPLANT
NS IRRIG 1000ML POUR BTL (IV SOLUTION) ×3 IMPLANT
PAD ARMBOARD 7.5X6 YLW CONV (MISCELLANEOUS) ×6 IMPLANT
PENCIL BUTTON HOLSTER BLD 10FT (ELECTRODE) ×2 IMPLANT
POUCH SPECIMEN RETRIEVAL 10MM (ENDOMECHANICALS) ×3 IMPLANT
RELOAD 45 VASCULAR/THIN (ENDOMECHANICALS) IMPLANT
RELOAD STAPLE 45 2.5 WHT GRN (ENDOMECHANICALS) IMPLANT
RELOAD STAPLE 45 3.5 BLU ETS (ENDOMECHANICALS) IMPLANT
RELOAD STAPLE TA45 3.5 REG BLU (ENDOMECHANICALS) IMPLANT
SCALPEL HARMONIC ACE (MISCELLANEOUS) ×3 IMPLANT
SET IRRIG TUBING LAPAROSCOPIC (IRRIGATION / IRRIGATOR) ×3 IMPLANT
SLEEVE ENDOPATH XCEL 5M (ENDOMECHANICALS) ×3 IMPLANT
SPECIMEN JAR SMALL (MISCELLANEOUS) ×3 IMPLANT
SPONGE GAUZE 4X4 12PLY STER LF (GAUZE/BANDAGES/DRESSINGS) ×2 IMPLANT
SPONGE LAP 18X18 X RAY DECT (DISPOSABLE) ×2 IMPLANT
SUT MON AB 4-0 PC3 18 (SUTURE) ×3 IMPLANT
SUT NOVA NAB GS-21 1 T12 (SUTURE) ×2 IMPLANT
SUT SILK 2 0 SH CR/8 (SUTURE) ×4 IMPLANT
SUT SILK 3 0 SH CR/8 (SUTURE) ×2 IMPLANT
SUT VIC AB 2-0 SH 27 (SUTURE) ×3
SUT VIC AB 2-0 SH 27XBRD (SUTURE) IMPLANT
SYR BULB IRRIGATION 50ML (SYRINGE) ×2 IMPLANT
TAPE CLOTH SURG 4X10 WHT LF (GAUZE/BANDAGES/DRESSINGS) ×2 IMPLANT
TOWEL OR 17X24 6PK STRL BLUE (TOWEL DISPOSABLE) ×3 IMPLANT
TOWEL OR 17X26 10 PK STRL BLUE (TOWEL DISPOSABLE) ×3 IMPLANT
TRAY LAPAROSCOPIC MC (CUSTOM PROCEDURE TRAY) ×3 IMPLANT
TROCAR XCEL BLUNT TIP 100MML (ENDOMECHANICALS) ×3 IMPLANT
TROCAR XCEL NON-BLD 5MMX100MML (ENDOMECHANICALS) ×3 IMPLANT
TUBING INSUFFLATION (TUBING) ×3 IMPLANT

## 2016-09-07 NOTE — Anesthesia Preprocedure Evaluation (Addendum)
Anesthesia Evaluation  Patient identified by MRN, date of birth, ID band Patient awake    Reviewed: Allergy & Precautions, NPO status , Patient's Chart, lab work & pertinent test results  Airway Mallampati: II  TM Distance: >3 FB Neck ROM: Full    Dental  (+) Poor Dentition, Upper Dentures,    Pulmonary Current Smoker,    Pulmonary exam normal        Cardiovascular hypertension, Pt. on medications Normal cardiovascular exam     Neuro/Psych negative neurological ROS  negative psych ROS   GI/Hepatic negative GI ROS, Neg liver ROS,   Endo/Other  negative endocrine ROS  Renal/GU negative Renal ROS  negative genitourinary   Musculoskeletal negative musculoskeletal ROS (+)   Abdominal   Peds negative pediatric ROS (+)  Hematology negative hematology ROS (+)   Anesthesia Other Findings   Reproductive/Obstetrics negative OB ROS                            Anesthesia Physical Anesthesia Plan  ASA: II  Anesthesia Plan: General   Post-op Pain Management:    Induction: Intravenous, Rapid sequence and Cricoid pressure planned  Airway Management Planned: Oral ETT  Additional Equipment:   Intra-op Plan:   Post-operative Plan: Extubation in OR  Informed Consent: I have reviewed the patients History and Physical, chart, labs and discussed the procedure including the risks, benefits and alternatives for the proposed anesthesia with the patient or authorized representative who has indicated his/her understanding and acceptance.   Dental advisory given  Plan Discussed with:   Anesthesia Plan Comments:         Anesthesia Quick Evaluation

## 2016-09-07 NOTE — ED Provider Notes (Signed)
[facility]-EMERGENCY DEPT Provider Note   CSN: [id number] Arrival date & time: 09/07/16  0900     History   Chief Complaint Chief Complaint  Patient presents with  . Abdominal Pain  . Constipation    [name] is a 65 y.o. male hx of HTN, HL, alcohol abuse, here with abdominal pain. Lower abdominal pain for the last 2 days or so. States that he felt nauseated and has been constipated for a week. Had some subjective chills but denies any fevers. Denies any urinary symptoms. States that he had diverticulitis about a year ago with similar symptoms.  The history is provided by the patient.    Past Medical History:  Diagnosis Date  . Hypercholesterolemia   . Hypertension     Patient Active Problem List   Diagnosis Date Noted  . Diverticulitis of colon without hemorrhage 09/07/2015  . Abscess of groin, right 09/18/2012  . BLOOD IN STOOL 08/04/2010  . ADHESIVE CAPSULITIS, RIGHT 07/19/2010  . FOLLICULITIS 07/14/2010  . SHOULDER PAIN, RIGHT 07/14/2010  . INSOMNIA 07/14/2010  . GLUCOSE INTOLERANCE 04/20/2010  . FATIGUE 04/12/2010  . GAIT IMBALANCE 04/12/2010  . CEREBROVASCULAR ACCIDENT 01/31/2010  . HYPERLIPIDEMIA 12/23/2009  . ALCOHOL ABUSE 12/23/2009  . TOBACCO ABUSE 12/23/2009  . ERECTILE DYSFUNCTION, ORGANIC 12/23/2009  . ESSENTIAL HYPERTENSION, BENIGN 12/09/2009    History reviewed. No pertinent surgical history.     Home Medications    Prior to Admission medications   Medication Sig Start Date End Date Taking? Authorizing Provider  amLODipine-benazepril (LOTREL) 10-20 MG capsule Take 1 capsule by mouth daily. 01/04/16  Yes Ambrose Finland, NP  docusate sodium (COLACE) 100 MG capsule Take 1 capsule (100 mg total) by mouth at bedtime as needed for mild constipation. 01/04/16  Yes Ambrose Finland, NP  Multiple Vitamin (MULTIVITAMIN WITH MINERALS) TABS tablet Take 1 tablet by mouth daily.   Yes Historical Provider, MD  polyethylene glycol powder (GLYCOLAX/MIRALAX)  powder Take 17 g by mouth daily. 01/04/16  Yes Ambrose Finland, NP  cephALEXin (KEFLEX) 500 MG capsule Take 1 capsule (500 mg total) by mouth 2 (two) times daily. 01/04/16   Ambrose Finland, NP    Family History History reviewed. No pertinent family history.  Social History Social History  Substance Use Topics  . Smoking status: Current Every Day Smoker    Packs/day: 1.00    Types: Cigarettes  . Smokeless tobacco: Not on file  . Alcohol use No     Allergies   Review of patient's allergies indicates no known allergies.   Review of Systems Review of Systems  Gastrointestinal: Positive for abdominal pain and constipation.  All other systems reviewed and are negative.    Physical Exam Updated Vital Signs BP (!) 167/107 (BP Location: Left Arm)   Pulse 89   Temp 98 F (36.7 C) (Oral)   Resp 16   SpO2 99%   Physical Exam  Constitutional: He is oriented to person, place, and time.  Uncomfortable   HENT:  Head: Normocephalic.  MM slightly dry   Eyes: EOM are normal. Pupils are equal, round, and reactive to light.  Neck: Normal range of motion. Neck supple.  Cardiovascular: Normal rate, regular rhythm and normal heart sounds.   Pulmonary/Chest: Effort normal and breath sounds normal. No respiratory distress. He has no wheezes.  Abdominal: Soft.  + mild diffuse LLQ and RLQ and LUQ tenderness, mild rebound   Musculoskeletal: Normal range of motion.  Neurological: He is alert and  oriented to person, place, and time.  Skin: Skin is warm.  Psychiatric: He has a normal mood and affect.  Nursing note and vitals reviewed.    ED Treatments / Results  Labs (all labs ordered are listed, but only abnormal results are displayed) Labs Reviewed  COMPREHENSIVE METABOLIC PANEL - Abnormal; Notable for the following:       Result Value   Glucose, Bld 132 (*)    Total Protein 8.9 (*)    All other components within normal limits  CBC - Abnormal; Notable for the following:    WBC 15.4  (*)    MCHC 36.2 (*)    All other components within normal limits  LIPASE, BLOOD  URINALYSIS, ROUTINE W REFLEX MICROSCOPIC (NOT AT [facility])    EKG  EKG Interpretation None       Radiology No results found.  Procedures Procedures (including critical care time)  Angiocath insertion Performed by: Richardean Canalavid H Yao  Consent: Verbal consent obtained. Risks and benefits: risks, benefits and alternatives were discussed Time out: Immediately prior to procedure a "time out" was called to verify the correct patient, procedure, equipment, support staff and site/side marked as required.  Preparation: Patient was prepped and draped in the usual sterile fashion.  Vein Location: R antecube  Ultrasound Guided  Gauge: 20 long   Normal blood return and flush without difficulty Patient tolerance: Patient tolerated the procedure well with no immediate complications.     Medications Ordered in ED Medications  sodium chloride 0.9 % bolus 1,000 mL (1,000 mLs Intravenous New Bag/Given 09/07/16 1105)  iopamidol (ISOVUE-300) 61 % injection (100 mLs  Contrast Given 09/07/16 1200)     Initial Impression / Assessment and Plan / ED Course  I have reviewed the triage vital signs and the nursing notes.  Pertinent labs & imaging results that were available during my care of the patient were reviewed by me and considered in my medical decision making (see chart for details).  Clinical Course    James Riley is a 65 y.o. male here with abdominal pain. Consider diverticulitis vs appendicitis vs constipation. Will get labs, CT ab/pel   12:30 pm CT showed ruptured appy. WBC 15. Consulted general surgery, who recommend zosyn. Surgery to admit.   Final Clinical Impressions(s) / ED Diagnoses   Final diagnoses:  None    New Prescriptions New Prescriptions   No medications on file     Charlynne Panderavid Hsienta Yao, MD 09/07/16 1322

## 2016-09-07 NOTE — ED Triage Notes (Signed)
Pt reports abd pain ongoing for several days. Having mild distention, reports last bowel movement was a week ago. Denies n/v.

## 2016-09-07 NOTE — ED Notes (Signed)
EDP at bedside with ultrasound for IV insertion.

## 2016-09-07 NOTE — Op Note (Signed)
APPENDECTOMY LAPAROSCOPIC converted to open appendectomy  Procedure Note  Andy GaussRonald W Lemere 09/07/2016   Pre-op Diagnosis: Appendicitis     Post-op Diagnosis: perforated appendicitis  Procedure(s): APPENDECTOMY LAPAROSCOPIC converted to open appendectomy  Surgeon(s): Abigail Miyamotoouglas Quanta Robertshaw, MD  Anesthesia: General  Staff:  Circulator: Julieta BelliniJamie S Haralam, RN Relief Circulator: Rolan LipaKathleen D Hunt, RN Scrub Person: Delorse LimberAngelia F Banks, CST; Huel CoventryMelanie A Saxton RN First Assistant: Maureen RalphsSheila C Bowman, RN  Estimated Blood Loss: less than 100 mL               Specimens: sent to path          [facility] Hospital [facility] Fort WorthBLACKMAN,Caelynn Marshman A   Date: 09/07/2016  Time: 4:39 PM

## 2016-09-07 NOTE — Anesthesia Procedure Notes (Signed)
Procedure Name: Intubation Date/Time: 09/07/2016 3:12 PM Performed by: Wray KearnsFOLEY, Garren Greenman A Pre-anesthesia Checklist: Patient identified, Emergency Drugs available, Suction available and Patient being monitored Patient Re-evaluated:Patient Re-evaluated prior to inductionOxygen Delivery Method: Circle System Utilized and Circle system utilized Preoxygenation: Pre-oxygenation with 100% oxygen Intubation Type: IV induction, Rapid sequence and Cricoid Pressure applied Ventilation: Mask ventilation without difficulty Laryngoscope Size: Mac and 4 Grade View: Grade I Tube type: Oral Tube size: 7.5 mm Number of attempts: 1 Airway Equipment and Method: Stylet and Oral airway Placement Confirmation: ETT inserted through vocal cords under direct vision,  positive ETCO2 and breath sounds checked- equal and bilateral Secured at: 23 cm Tube secured with: Tape Dental Injury: Teeth and Oropharynx as per pre-operative assessment

## 2016-09-07 NOTE — ED Notes (Signed)
Patient transported to CT 

## 2016-09-07 NOTE — H&P (Signed)
[facility] Admission Note  James Riley 1951-06-14  [id number].    Requesting MD: Dr. Darl Householder Chief Complaint/Reason for Consult: acute appendicitis  HPI:  65 y/o male with a PMH of hypertension, tobacco use, CVA, diverticulitis, and constipation who presents to [facility] with worsening, lower abdominal pain. Patient states his pain started 2-3 days ago and has gradually worsened. Associated with nausea. This morning the pain was so severe he was unable to stand up straight so he came to the ED for evaluation. Pain is currently rated as a 6/10 and is non-radiating. Patient reports recent upper respiratory infection but denies other illness. Denies fever, chills, CP, SOB, vomiting, diarrhea, urinary symptoms, and constipation. He smokes 1-2 packs of cigarettes daily and drinks 0-1 beers per day. He denies a history of abdominal surgeries. Denies use of blood thinning medications. NKDA. Patient is retired and lives at home alone.   WBC- 15, CT scan significant for a dilated appendix with pericecal fluid collection about 3.7 cm x 1.3 cm x 3.7cm.   ROS: All systems reviewed and otherwise negative except for as above  History reviewed. No pertinent family history.  Past Medical History:  Diagnosis Date  . Hypercholesterolemia   . Hypertension     History reviewed. No pertinent surgical history.  Social History:  reports that he has been smoking Cigarettes.  He has been smoking about 1.00 pack per day. He does not have any smokeless tobacco history on file. He reports that he does not drink alcohol or use drugs.  Allergies: No Known Allergies   (Not in a hospital admission)  Blood pressure (!) 156/105, pulse 73, temperature 98 F (36.7 C), temperature source Oral, resp. rate 15, SpO2 98 %. Physical Exam: General: pleasant, WD/WN AA male who is laying in bed in NAD HEENT: head is normocephalic, atraumatic.  Mouth is pink and moist Heart: regular, rate, and rhythm.  No obvious  murmurs, gallops, or rubs noted.  Palpable pedal pulses bilaterally Lungs: CTAB, no wheezes, rhonchi, or rales noted.  Respiratory effort nonlabored Abd: soft, TTP lower abdomen, most pronounced over LLQ, ND, +BS, no masses, hernias, or organomegaly. No peritonitis. MS: all 4 extremities are symmetrical with no cyanosis, clubbing, or edema. Skin: warm and dry with no masses, lesions, or rashes Psych:  appropriate affect. Neuro: A&Ox3, normal speech  Results for orders placed or performed during the hospital encounter of 09/07/16 (from the past 48 hour(s))  Lipase, blood     Status: None   Collection Time: 09/07/16  9:20 AM  Result Value Ref Range   Lipase 17 11 - 51 U/L  Comprehensive metabolic panel     Status: Abnormal   Collection Time: 09/07/16  9:20 AM  Result Value Ref Range   Sodium 137 135 - 145 mmol/L   Potassium 3.8 3.5 - 5.1 mmol/L   Chloride 102 101 - 111 mmol/L   CO2 24 22 - 32 mmol/L   Glucose, Bld 132 (H) 65 - 99 mg/dL   BUN 6 6 - 20 mg/dL   Creatinine, Ser 0.81 0.61 - 1.24 mg/dL   Calcium 9.9 8.9 - 10.3 mg/dL   Total Protein 8.9 (H) 6.5 - 8.1 g/dL   Albumin 4.5 3.5 - 5.0 g/dL   AST 20 15 - 41 U/L   ALT 18 17 - 63 U/L   Alkaline Phosphatase 81 38 - 126 U/L   Total Bilirubin 1.0 0.3 - 1.2 mg/dL   GFR calc non Af Amer >60 >60 mL/min  GFR calc Af Amer >60 >60 mL/min    Comment: (NOTE) The eGFR has been calculated using the CKD EPI equation. This calculation has not been validated in all clinical situations. eGFR's persistently <60 mL/min signify possible Chronic Kidney Disease.    Anion gap 11 5 - 15  CBC     Status: Abnormal   Collection Time: 09/07/16  9:20 AM  Result Value Ref Range   WBC 15.4 (H) 4.0 - 10.5 K/uL   RBC 5.13 4.22 - 5.81 MIL/uL   Hemoglobin 16.8 13.0 - 17.0 g/dL   HCT 46.4 39.0 - 52.0 %   MCV 90.4 78.0 - 100.0 fL   MCH 32.7 26.0 - 34.0 pg   MCHC 36.2 (H) 30.0 - 36.0 g/dL   RDW 13.7 11.5 - 15.5 %   Platelets 218 150 - 400 K/uL   Ct  Abdomen Pelvis W Contrast  Result Date: 09/07/2016 CLINICAL DATA:  Abdominal pain with nausea, vomiting and difficulty urinating for 2 days. Pain is worse on the right. EXAM: CT ABDOMEN AND PELVIS WITH CONTRAST TECHNIQUE: Multidetector CT imaging of the abdomen and pelvis was performed using the standard protocol following bolus administration of intravenous contrast. CONTRAST:  100 ml ISOVUE-300 IOPAMIDOL (ISOVUE-300) INJECTION 61% COMPARISON:  CT abdomen and pelvis 09/06/2015. FINDINGS: Lower chest: Mild atelectasis or scar is seen in the right middle lobe. Small hiatal hernia is noted. Hepatobiliary: Unremarkable. Pancreas: Unremarkable. Spleen: Unremarkable. Adrenals/Urinary Tract: The adrenal glands appear normal. A punctate nonobstructing stone is identified in the upper pole the right kidney. The kidneys otherwise appear normal. Stomach/Bowel: The appendix is markedly dilated with an air-fluid level within and. Appendicular with at the appendiceal orifice is measures approximately 1 cm in diameter. Pericecal fluid is identified measuring approximately 3.7 cm AP by 1.3 cm transverse by 3.7 cm craniocaudal with partial rim enhancement compatible with phlegmon or early abscess. The ascending and proximal transverse colon are dilated with liquid type stool present. The small bowel is unremarkable. Vascular/Lymphatic: Aortic atherosclerosis is identified. No lymphadenopathy. Reproductive: Unremarkable. Other: None. Musculoskeletal: No lytic or sclerotic bone lesion. IMPRESSION: The study is positive for acute appendicitis. The appendix has likely ruptured with small phlegmon or abscess seen about the cecum. Atherosclerosis. Punctate nonobstructing stone upper pole right kidney. Critical Value/emergent results were called by telephone at the time of interpretation on 09/07/2016 at 12:23 pm to Dr. Shirlyn Goltz , who verbally acknowledged these results. Electronically Signed   By: Inge Rise M.D.   On: 09/07/2016  12:24   Assessment/Plan Acute appendicitis with abscess - abdominal tenderness and CT scan suggestive of contained perforation of appendix - leukocytosis: 15 - NPO, IVF, antiemetics, analgesics - IV Zosyn - laparoscopic appendectomy   Hypertension - will hold home medications and re-start post-operatively Tobacco abuse  Constipation PMH CVA - not on blood thinning medications  Dispo: OR today for laparoscopic appendectomy by Dr. Ninfa Linden Admit to observation  Jill Alexanders, [facility] 09/07/2016, 1:32 PM Pager: [phone] Consults: [phone] Mon-Fri 7:00 am-4:30 pm Sat-Sun 7:00 am-11:30 am

## 2016-09-07 NOTE — Transfer of Care (Signed)
Immediate Anesthesia Transfer of Care Note  Patient: James GaussRonald W Seubert  Procedure(s) Performed: Procedure(s): APPENDECTOMY LAPAROSCOPIC converted to open appendectomy (N/A)  Patient Location: PACU  Anesthesia Type:General  Level of Consciousness: sedated and responds to stimulation  Airway & Oxygen Therapy: Patient Spontanous Breathing and Patient connected to face mask oxygen  Post-op Assessment: Report given to RN and Post -op Vital signs reviewed and stable  Post vital signs: Reviewed and stable  Last Vitals:  Vitals:   09/07/16 0903 09/07/16 1315  BP: (!) 167/107 (!) 156/105  Pulse: 89 73  Resp: 16 15  Temp: 36.7 C     Last Pain:  Vitals:   09/07/16 0906  TempSrc:   PainSc: 10-Worst pain ever         Complications: No apparent anesthesia complications

## 2016-09-08 LAB — BASIC METABOLIC PANEL
ANION GAP: 7 (ref 5–15)
BUN: 12 mg/dL (ref 6–20)
CALCIUM: 8.7 mg/dL — AB (ref 8.9–10.3)
CO2: 22 mmol/L (ref 22–32)
Chloride: 105 mmol/L (ref 101–111)
Creatinine, Ser: 1.13 mg/dL (ref 0.61–1.24)
GFR calc non Af Amer: >60 mL/min (ref >60)
GLUCOSE: 148 mg/dL — AB (ref 65–99)
POTASSIUM: 4.9 mmol/L (ref 3.5–5.1)
Sodium: 134 mmol/L — ABNORMAL LOW (ref 135–145)

## 2016-09-08 LAB — CBC
HEMATOCRIT: 40 % (ref 39.0–52.0)
HEMOGLOBIN: 14 g/dL (ref 13.0–17.0)
MCH: 32.8 pg (ref 26.0–34.0)
MCHC: 35 g/dL (ref 30.0–36.0)
MCV: 93.7 fL (ref 78.0–100.0)
Platelets: 193 10*3/uL (ref 150–400)
RBC: 4.27 MIL/uL (ref 4.22–5.81)
RDW: 14 % (ref 11.5–15.5)
WBC: 18 10*3/uL — AB (ref 4.0–10.5)

## 2016-09-08 MED ORDER — KETOROLAC TROMETHAMINE 15 MG/ML IJ SOLN
15.0000 mg | Freq: Once | INTRAMUSCULAR | Status: AC
  Administered 2016-09-08: 15 mg via INTRAVENOUS
  Filled 2016-09-08: qty 1

## 2016-09-08 MED ORDER — NICOTINE 14 MG/24HR TD PT24
14.0000 mg | MEDICATED_PATCH | Freq: Every day | TRANSDERMAL | Status: DC
  Administered 2016-09-08 – 2016-09-11 (×4): 14 mg via TRANSDERMAL
  Filled 2016-09-08 (×4): qty 1

## 2016-09-08 NOTE — Progress Notes (Signed)
1 Day Post-Op  Subjective: Alert and stable.  Sitting up in chair.  Denies nausea Voiding small amounts Drainage serosanguineous Creatinine up to 1.13.  WBC 18,000.  Hemoglobin 14.0.  Objective: Vital signs in last 24 hours: Temp:  [98.3 F (36.8 C)-99.1 F (37.3 C)] 99.1 F (37.3 C) (10/07 0942) Pulse Rate:  [73-87] 80 (10/07 0942) Resp:  [15-18] 17 (10/07 0942) BP: (115-158)/(66-105) 115/66 (10/07 0942) SpO2:  [94 %-100 %] 94 % (10/07 0942) Last BM Date: 09/04/16  Intake/Output from previous day: 10/06 0701 - 10/07 0700 In: 1872.5 [I.V.:1860; IV Piggyback:12.5] Out: 435 [Urine:200; Drains:135; Blood:100] Intake/Output this shift: Total I/O In: 20 [P.O.:20] Out: 210 [Urine:180; Drains:30]  General appearance: Alert.  Cooperative.  Almost no distress. Resp: clear to auscultation bilaterally GI: Soft but somewhat distended.  Trocar sites and right lower quadrant wounds closed.  No drainage.  Appropriately tender.  JP drainage serosanguineous.  Firm suprapubic area  Lab Results:   Recent Labs  09/07/16 0920 09/08/16 0332  WBC 15.4* 18.0*  HGB 16.8 14.0  HCT 46.4 40.0  PLT 218 193   BMET  Recent Labs  09/07/16 0920 09/08/16 0332  NA 137 134*  K 3.8 4.9  CL 102 105  CO2 24 22  GLUCOSE 132* 148*  BUN 6 12  CREATININE 0.81 1.13  CALCIUM 9.9 8.7*   PT/INR No results for input(s): LABPROT, INR in the last 72 hours. ABG No results for input(s): PHART, HCO3 in the last 72 hours.  Invalid input(s): PCO2, PO2  Studies/Results: Ct Abdomen Pelvis W Contrast  Result Date: 09/07/2016 CLINICAL DATA:  Abdominal pain with nausea, vomiting and difficulty urinating for 2 days. Pain is worse on the right. EXAM: CT ABDOMEN AND PELVIS WITH CONTRAST TECHNIQUE: Multidetector CT imaging of the abdomen and pelvis was performed using the standard protocol following bolus administration of intravenous contrast. CONTRAST:  100 ml ISOVUE-300 IOPAMIDOL (ISOVUE-300) INJECTION 61%  COMPARISON:  CT abdomen and pelvis 09/06/2015. FINDINGS: Lower chest: Mild atelectasis or scar is seen in the right middle lobe. Small hiatal hernia is noted. Hepatobiliary: Unremarkable. Pancreas: Unremarkable. Spleen: Unremarkable. Adrenals/Urinary Tract: The adrenal glands appear normal. A punctate nonobstructing stone is identified in the upper pole the right kidney. The kidneys otherwise appear normal. Stomach/Bowel: The appendix is markedly dilated with an air-fluid level within and. Appendicular with at the appendiceal orifice is measures approximately 1 cm in diameter. Pericecal fluid is identified measuring approximately 3.7 cm AP by 1.3 cm transverse by 3.7 cm craniocaudal with partial rim enhancement compatible with phlegmon or early abscess. The ascending and proximal transverse colon are dilated with liquid type stool present. The small bowel is unremarkable. Vascular/Lymphatic: Aortic atherosclerosis is identified. No lymphadenopathy. Reproductive: Unremarkable. Other: None. Musculoskeletal: No lytic or sclerotic bone lesion. IMPRESSION: The study is positive for acute appendicitis. The appendix has likely ruptured with small phlegmon or abscess seen about the cecum. Atherosclerosis. Punctate nonobstructing stone upper pole right kidney. Critical Value/emergent results were called by telephone at the time of interpretation on 09/07/2016 at 12:23 pm to Dr. Chaney Malling , who verbally acknowledged these results. Electronically Signed   By: Drusilla Kanner M.D.   On: 09/07/2016 12:24    Anti-infectives: Anti-infectives    Start     Dose/Rate Route Frequency Ordered Stop   09/07/16 1900  piperacillin-tazobactam (ZOSYN) IVPB 3.375 g     3.375 g 12.5 mL/hr over 240 Minutes Intravenous Every 8 hours 09/07/16 1857     09/07/16 1715  piperacillin-tazobactam (  ZOSYN) IVPB 3.375 g  Status:  Discontinued     3.375 g 12.5 mL/hr over 240 Minutes Intravenous Every 8 hours 09/07/16 1701 09/07/16 1843    09/07/16 1300  piperacillin-tazobactam (ZOSYN) IVPB 3.375 g     3.375 g 100 mL/hr over 30 Minutes Intravenous  Once 09/07/16 1254 09/07/16 1343      Assessment/Plan: s/p Procedure(s): APPENDECTOMY LAPAROSCOPIC converted to open appendectomy  POD #1.  Laparoscopic converted to open appendectomy for ruptured appendicitis with localized fecal peritonitis Stable Allow clear liquids IV Zosyn Mobilize  incentive spirometry  Difficulty voiding.  Check PVR.  Suspect urinary retention.  May need Foley.  Tobacco abuse Hypertension Remote history CVA  Labs tomorrow    LOS: 1 day    Ernestene MentionNGRAM,Nikki Rusnak M 09/08/2016

## 2016-09-08 NOTE — Op Note (Signed)
NAMEMarland Kitchen  RAEFORD, [name] NO.:  [id number]  MEDICAL RECORD NO.:  [id number]  LOCATION:  6N08C                        FACILITY:  [facility]  PHYSICIAN:  James Riley, M.D. DATE OF BIRTH:  November 16, 1951  DATE OF PROCEDURE:  09/07/2016 DATE OF DISCHARGE:                              OPERATIVE REPORT   PREOPERATIVE DIAGNOSIS:  Acute appendicitis.  POSTOPERATIVE DIAGNOSIS:  Perforated appendicitis.  PROCEDURE:  Laparoscopic converted to open appendectomy.  SURGEON:  James Riley, M.D.  ANESTHESIA:  General endotracheal anesthesia.  ESTIMATED BLOOD LOSS:  100 mL.  INDICATIONS:  This is a 65 year old gentleman, who presented with right lower quadrant abdominal pain.  He was found on CT of the abdomen and pelvis to have findings consistent with appendicitis and possible perforation with appendicolith and abscess.  Decision was made to proceed to the operating room.  FINDINGS:  The patient was found to have a perforated retrocecal appendix that has been curved underneath the small bowel and into the pelvis.  There were gross contamination of the stool and appendicolith. Decision was made to convert to an open procedure.  PROCEDURE IN DETAIL:  The patient was brought to the operating room, identified as James Riley.  He was placed supine on the operating room table and general anesthesia was induced.  His abdomen was then prepped and draped in the usual sterile fashion.  I made a small vertical incision just below the umbilicus with a scalpel.  I carried this down to the fascia, which was then opened with a scalpel.  A hemostat was used to pass through the peritoneal cavity under direct vision.  A 0 Vicryl pursestring suture was then placed around the fascial opening. The Hasson port was placed through the opening and insufflation of the abdomen was begun.  A 5-mm port was then placed in the patient's right upper quadrant and another in the left lower quadrant  both under direct vision.  I then evaluated the cecum in right lower quadrant.  The appendix was now easily visualized.  It was a retrocecal appendix.  I mobilized the cecum along the white line of Toldt and found the base of the appendix was already ruptured with an appendicolith coming out and stool and purulence.  I was able to trace this as it curved and went down into the pelvis underneath the small intestines.  It was extremely stuck to the terminal ileum and going underneath it.  An attempt to dissect this free did not appear possible injuring the bowel, so a decision was made to convert to an open procedure.  I thus removed all laparoscopic ports and deflated the abdomen.  I then made a transverse incision in the patient's right lower quadrant with a scalpel.  I took this down through the fascia and muscle layers sequentially and then opened the peritoneum entirely with this incision.  I was then able to elevate the cecum.  I was able to pull the appendix out of the pelvis and it came apart in pieces and they were all removed.  I was able to achieve hemostasis in the mesoappendix with the Harmonic Scalpel as well as several interrupted silk sutures.  I then dissected  the rest of the appendix off and evaluated the base.  I then tied off the base with several interrupted silk sutures.  The cecum was quite woody but sutures appeared to hold.  At this point, I then copiously irrigated the abdomen with normal saline.  Hemostasis appeared to be achieved.  I made a separate skin incision and placed a 19-French Blake drain into the pelvis and sutured this in place with a silk suture.  I then closed the posterior peritoneum and fascia with a running 2-0 Vicryl suture.  I then closed the anterior fascia with interrupted figure-of-eight #1 Novafil sutures.  I then irrigated the skin and closed loosely with skin staples.  The other incisions were closed with staples as well.  Before doing  this, I just anesthetized all wounds with Marcaine.  The drain was placed to bulb suction.  The patient tolerated the procedure well.  All the counts were correct at the end of the procedure.  The patient was then extubated in the operating room and taken in stable condition to recovery room.     James Miyamotoouglas Caydan Riley, M.D.     DB/MEDQ  D:  09/07/2016  T:  09/08/2016  Job:  [id number]

## 2016-09-09 LAB — BASIC METABOLIC PANEL
Anion gap: 9 (ref 5–15)
BUN: 16 mg/dL (ref 6–20)
CALCIUM: 8.7 mg/dL — AB (ref 8.9–10.3)
CO2: 22 mmol/L (ref 22–32)
Chloride: 106 mmol/L (ref 101–111)
Creatinine, Ser: 1.11 mg/dL (ref 0.61–1.24)
GFR calc non Af Amer: >60 mL/min (ref >60)
Glucose, Bld: 128 mg/dL — ABNORMAL HIGH (ref 65–99)
Potassium: 3.8 mmol/L (ref 3.5–5.1)
SODIUM: 137 mmol/L (ref 135–145)

## 2016-09-09 LAB — CBC
HCT: 39.1 % (ref 39.0–52.0)
HEMOGLOBIN: 13.6 g/dL (ref 13.0–17.0)
MCH: 32.8 pg (ref 26.0–34.0)
MCHC: 34.8 g/dL (ref 30.0–36.0)
MCV: 94.2 fL (ref 78.0–100.0)
Platelets: 192 10*3/uL (ref 150–400)
RBC: 4.15 MIL/uL — ABNORMAL LOW (ref 4.22–5.81)
RDW: 14.1 % (ref 11.5–15.5)
WBC: 15.1 10*3/uL — ABNORMAL HIGH (ref 4.0–10.5)

## 2016-09-09 MED ORDER — HALOPERIDOL LACTATE 5 MG/ML IJ SOLN
5.0000 mg | Freq: Once | INTRAMUSCULAR | Status: AC
Start: 2016-09-09 — End: 2016-09-09
  Administered 2016-09-09: 5 mg via INTRAVENOUS
  Filled 2016-09-09: qty 1

## 2016-09-09 NOTE — Progress Notes (Signed)
1000 Pt is calm and cooperative,no agitation, a little confused but easily reoriented. Up and ambulating with steady gait.

## 2016-09-09 NOTE — Progress Notes (Signed)
2 Days Post-Op  Subjective: Doing better. Alert.  Cooperative.  No agitation.  Tolerating clear liquids.  Passing flatus.  No stool yet.  Voiding without difficulty.  Pain controlled WBC 15,000.  Hemoglobin 13.6.  Potassium 3.8.  Creatinine 1.11.  Objective: Vital signs in last 24 hours: Temp:  [98.6 F (37 C)-98.7 F (37.1 C)] 98.6 F (37 C) (10/08 0628) Pulse Rate:  [81-86] 86 (10/08 0628) Resp:  [17-19] 19 (10/08 0628) BP: (121-148)/(60-65) 121/60 (10/08 0628) SpO2:  [98 %] 98 % (10/08 0628) Last BM Date: 09/04/16  Intake/Output from previous day: 10/07 0701 - 10/08 0700 In: 540 [P.O.:440; IV Piggyback:100] Out: 1665 [Urine:1605; Drains:60] Intake/Output this shift: Total I/O In: -  Out: 300 [Urine:300]    General appearance: Alert.  Cooperative.  Almost no distress. Resp: clear to auscultation bilaterally GI: Soft, less distended.   active bowel sounds Trocar sites and right lower quadrant wounds closed.  No drainage.  Appropriately tender.  JP drainage serosanguineous.    Lab Results:  Results for orders placed or performed during the hospital encounter of 09/07/16 (from the past 24 hour(s))  CBC     Status: Abnormal   Collection Time: 09/09/16  2:49 AM  Result Value Ref Range   WBC 15.1 (H) 4.0 - 10.5 K/uL   RBC 4.15 (L) 4.22 - 5.81 MIL/uL   Hemoglobin 13.6 13.0 - 17.0 g/dL   HCT 16.139.1 09.639.0 - 04.552.0 %   MCV 94.2 78.0 - 100.0 fL   MCH 32.8 26.0 - 34.0 pg   MCHC 34.8 30.0 - 36.0 g/dL   RDW 40.914.1 81.111.5 - 91.415.5 %   Platelets 192 150 - 400 K/uL  Basic metabolic panel     Status: Abnormal   Collection Time: 09/09/16  2:49 AM  Result Value Ref Range   Sodium 137 135 - 145 mmol/L   Potassium 3.8 3.5 - 5.1 mmol/L   Chloride 106 101 - 111 mmol/L   CO2 22 22 - 32 mmol/L   Glucose, Bld 128 (H) 65 - 99 mg/dL   BUN 16 6 - 20 mg/dL   Creatinine, Ser 7.821.11 0.61 - 1.24 mg/dL   Calcium 8.7 (L) 8.9 - 10.3 mg/dL   GFR calc non Af Amer >60 >60 mL/min   GFR calc Af Amer >60 >60  mL/min   Anion gap 9 5 - 15     Studies/Results: No results found.  Marland Kitchen. amLODipine  10 mg Oral Daily   And  . benazepril  20 mg Oral Daily  . enoxaparin (LOVENOX) injection  40 mg Subcutaneous Q24H  . nicotine  14 mg Transdermal Daily  . piperacillin-tazobactam (ZOSYN)  IV  3.375 g Intravenous Q8H     Assessment/Plan: s/p Procedure(s): APPENDECTOMY LAPAROSCOPIC converted to open appendectomy  POD #2.  Laparoscopic converted to open appendectomy for ruptured appendicitis with localized fecal peritonitis Stable Allow full liquids IV Zosyn Mobilize  incentive spirometry Hopefully home in a day or 2.  Difficulty voiding.  Resolved  Tobacco abuse-nicotine patch Hypertension Remote history CVA   @PROBHOSP @  LOS: 2 days    James Riley 09/09/2016  . .prob

## 2016-09-10 ENCOUNTER — Encounter (HOSPITAL_COMMUNITY): Payer: Self-pay | Admitting: Surgery

## 2016-09-10 MED ORDER — PNEUMOCOCCAL VAC POLYVALENT 25 MCG/0.5ML IJ INJ
0.5000 mL | INJECTION | INTRAMUSCULAR | Status: AC
  Administered 2016-09-11: 0.5 mL via INTRAMUSCULAR
  Filled 2016-09-10: qty 0.5

## 2016-09-10 NOTE — Care Management Important Message (Signed)
Important Message  Patient Details  Name: James Riley MRN: [id number] Date of Birth: 09/23/1951   Medicare Important Message Given:  Yes    Nonnie Pickney 09/10/2016, 12:20 PM

## 2016-09-10 NOTE — Progress Notes (Signed)
Patient ID: James Riley, male   DOB: 10-20-1951, 65 y.o.   MRN: [id number]  [facility] Progress Note  3 Days Post-Op  Subjective: Patient dressed and states he wants to go home today. He is tolerating full liquids and passing flatus but has not had a BM. He reports little pain.  Patient removed bulb suction early this morning, new bulb now in place.  Objective: Vital signs in last 24 hours: Temp:  [98.3 F (36.8 C)-99.1 F (37.3 C)] 99.1 F (37.3 C) (10/09 0558) Pulse Rate:  [87-110] 110 (10/09 0558) Resp:  [18-20] 20 (10/09 0558) BP: (127-152)/(63-77) 127/77 (10/09 0558) SpO2:  [96 %-98 %] 96 % (10/09 0558) Last BM Date: 09/04/16  Intake/Output from previous day: 10/08 0701 - 10/09 0700 In: 1160 [P.O.:360; I.V.:700; IV Piggyback:100] Out: 2560 [Urine:2550; Drains:10] Intake/Output this shift: No intake/output data recorded.  PE: Gen:  Alert, NAD, pleasant Pulm:  CTAB, effort nonlabored Abd: Soft, mildly distended, +BS, minimal tenderness around incisions and drain site, incisions C/D/I with staples intact, drain with no drainage in bulb  RLQ drain 10+cc/24hr >> patient removed bulb early this morning and we are unsure how much drainage was there prior to removal  Lab Results:   Recent Labs  09/08/16 0332 09/09/16 0249  WBC 18.0* 15.1*  HGB 14.0 13.6  HCT 40.0 39.1  PLT 193 192   BMET  Recent Labs  09/08/16 0332 09/09/16 0249  NA 134* 137  K 4.9 3.8  CL 105 106  CO2 22 22  GLUCOSE 148* 128*  BUN 12 16  CREATININE 1.13 1.11  CALCIUM 8.7* 8.7*   PT/INR No results for input(s): LABPROT, INR in the last 72 hours. CMP     Component Value Date/Time   NA 137 09/09/2016 0249   K 3.8 09/09/2016 0249   CL 106 09/09/2016 0249   CO2 22 09/09/2016 0249   GLUCOSE 128 (H) 09/09/2016 0249   BUN 16 09/09/2016 0249   CREATININE 1.11 09/09/2016 0249   CALCIUM 8.7 (L) 09/09/2016 0249   PROT 8.9 (H) 09/07/2016 0920   ALBUMIN 4.5 09/07/2016 0920   AST 20 09/07/2016 0920   ALT 18 09/07/2016 0920   ALKPHOS 81 09/07/2016 0920   BILITOT 1.0 09/07/2016 0920   GFRNONAA >60 09/09/2016 0249   GFRAA >60 09/09/2016 0249   Lipase     Component Value Date/Time   LIPASE 17 09/07/2016 0920       Studies/Results: No results found.  Anti-infectives: Anti-infectives    Start     Dose/Rate Route Frequency Ordered Stop   09/07/16 1900  piperacillin-tazobactam (ZOSYN) IVPB 3.375 g     3.375 g 12.5 mL/hr over 240 Minutes Intravenous Every 8 hours 09/07/16 1857     09/07/16 1715  piperacillin-tazobactam (ZOSYN) IVPB 3.375 g  Status:  Discontinued     3.375 g 12.5 mL/hr over 240 Minutes Intravenous Every 8 hours 09/07/16 1701 09/07/16 1843   09/07/16 1300  piperacillin-tazobactam (ZOSYN) IVPB 3.375 g     3.375 g 100 mL/hr over 30 Minutes Intravenous  Once 09/07/16 1254 09/07/16 1343       Assessment/Plan S/p Laparoscopic converted to open appendectomy - 09/07/16 Dr. Magnus Ivan - POD 3 - passing flatus, no BM  Tobacco abuse-nicotine patch Hypertension Remote history CVA  ID - zosyn day 4 FEN - soft diet VTE - lovenox  Plan - advance to soft diet. Continue mobilization. Awaiting full return of bowel function. Monitor drain output.    LOS:  3 days    Edson SnowballBROOKE A MILLER , [facility] 09/10/2016, 11:09 AM Pager: [phone] Consults: [phone] Mon-Fri 7:00 am-4:30 pm Sat-Sun 7:00 am-11:30 am

## 2016-09-10 NOTE — Discharge Instructions (Signed)
[facility]      [facility] [phone]  OPEN ABDOMINAL SURGERY: POST OP INSTRUCTIONS  Always review your discharge instruction sheet given to you by the facility where your surgery was performed.  IF YOU HAVE DISABILITY OR FAMILY LEAVE FORMS, YOU MUST BRING THEM TO THE OFFICE FOR PROCESSING.  PLEASE DO NOT GIVE THEM TO YOUR DOCTOR.  1. A prescription for pain medication may be given to you upon discharge.  Take your pain medication as prescribed, if needed.  If narcotic pain medicine is not needed, then you may take acetaminophen (Tylenol) or ibuprofen (Advil) as needed. 2. Take your usually prescribed medications unless otherwise directed. 3. If you need a refill on your pain medication, please contact your pharmacy. They will contact our office to request authorization.  Prescriptions will not be filled after 5pm or on week-ends. 4. You should follow a light diet the first few days after arrival home, such as soup and crackers, pudding, etc.unless your doctor has advised otherwise. A high-fiber, low fat diet can be resumed as tolerated.   Be sure to include lots of fluids daily. Most patients will experience some swelling and bruising on the chest and neck area.  Ice packs will help.  Swelling and bruising can take several days to resolve 5. Most patients will experience some swelling and bruising in the area of the incision. Ice pack will help. Swelling and bruising can take several days to resolve..  6. It is common to experience some constipation if taking pain medication after surgery.  Increasing fluid intake and taking a stool softener will usually help or prevent this problem from occurring.  A mild laxative (Milk of Magnesia or Miralax) should be taken according to package directions if there are no bowel movements after 48 hours. 7.  You may have steri-strips (small skin tapes) in place directly over the incision.  These strips should be left on the skin for 7-10 days.  If your  surgeon used skin glue on the incision, you may shower in 24 hours.  The glue will flake off over the next 2-3 weeks.  Any sutures or staples will be removed at the office during your follow-up visit. You may find that a light gauze bandage over your incision may keep your staples from being rubbed or pulled. You may shower and replace the bandage daily. 8. ACTIVITIES:  You may resume regular (light) daily activities beginning the next day--such as daily self-care, walking, climbing stairs--gradually increasing activities as tolerated.  You may have sexual intercourse when it is comfortable.  Refrain from any heavy lifting or straining until approved by your doctor. a. You may drive when you no longer are taking prescription pain medication, you can comfortably wear a seatbelt, and you can safely maneuver your car and apply brakes b. Return to Work: ___________________________________ 9. You should see your doctor in the office for a follow-up appointment approximately two weeks after your surgery.  Make sure that you call for this appointment within a day or two after you arrive home to insure a convenient appointment time. OTHER INSTRUCTIONS:  _____________________________________________________________ _____________________________________________________________  WHEN TO CALL YOUR DOCTOR: 1. Fever over 101.0 2. Inability to urinate 3. Nausea and/or vomiting 4. Extreme swelling or bruising 5. Continued bleeding from incision. 6. Increased pain, redness, or drainage from the incision. 7. Difficulty swallowing or breathing 8. Muscle cramping or spasms. 9. Numbness or tingling in hands or feet or around lips.  The clinic staff is available to   answer your questions during regular business hours.  Please don't hesitate to call and ask to speak to one of the nurses if you have concerns.  For further questions, please visit [email or web address]   

## 2016-09-10 NOTE — Progress Notes (Addendum)
Pt refuses to have an IV restart. He pulled out previous IV.

## 2016-09-10 NOTE — Progress Notes (Signed)
Patient removed bulb suction device from JP drain and dressing from surgical site.New dressing applied. Dr Lindie SpruceWyatt notified and OR called to request replacement bulb suction device.

## 2016-09-10 NOTE — Anesthesia Postprocedure Evaluation (Signed)
Anesthesia Post Note  Patient: James GaussRonald W Merk  Procedure(s) Performed: Procedure(s) (LRB): APPENDECTOMY LAPAROSCOPIC converted to open appendectomy (N/A)  Patient location during evaluation: PACU Anesthesia Type: General Level of consciousness: awake and alert Pain management: pain level controlled Vital Signs Assessment: post-procedure vital signs reviewed and stable Respiratory status: spontaneous breathing, nonlabored ventilation, respiratory function stable and patient connected to nasal cannula oxygen Cardiovascular status: blood pressure returned to baseline and stable Postop Assessment: no signs of nausea or vomiting Anesthetic complications: no    Last Vitals:  Vitals:   09/09/16 2016 09/10/16 0558  BP: (!) 152/70 127/77  Pulse: 99 (!) 110  Resp: 18 20  Temp: 37.3 C 37.3 C    Last Pain:  Vitals:   09/10/16 0631  TempSrc:   PainSc: 0-No pain                 Amrit Cress S

## 2016-09-11 MED ORDER — OXYCODONE HCL 5 MG PO TABS: 5.0000 mg | ORAL_TABLET | Freq: Four times a day (QID) | ORAL | 0 refills | Status: DC | PRN

## 2016-09-11 MED ORDER — AMOXICILLIN-POT CLAVULANATE 875-125 MG PO TABS: 1.0000 | ORAL_TABLET | Freq: Two times a day (BID) | ORAL | 0 refills | Status: AC

## 2016-09-11 NOTE — Progress Notes (Signed)
Patient discharged to home with instructions and prescriptions given to patient's daughter.

## 2016-09-11 NOTE — Consult Note (Signed)
            [facility] Primary [facility] Navigator  09/11/2016  [name] 02/17/1951 [id number]  Went to patient's room to identify possible discharge needs but patient was  discharged.   Primary care provider's office contacted Erskine Squibb(Jane) to notify of patient's discharge and need to follow-up for transition of care.  For additional questions please contact:  Karin GoldenLorraine A. Krissia Schreier, BSN, RN-BC [facility] PRIMARY CARE Navigator Cell: [phone]

## 2016-09-12 ENCOUNTER — Telehealth: Payer: Self-pay

## 2016-09-12 NOTE — Telephone Encounter (Signed)
This Case Manager received voicemail from Lorraine Ajel, RN CM with [facility], who indicatWyatt Riley patient discharged from hospital on 09/11/16 and needs a hospital follow-up appointment. Patient s/p appendectomy. Return call placed to James HasteLorraine Ajel, RN CM and informed her that this Case Manager would call patient to schedule hospital follow-up appointment. [place] Adult nurseappreciative.   Call placed to patient at #[phone]; however, unable to reach patient. HIPPA compliant voicemail left requesting return call.

## 2016-09-13 NOTE — Discharge Summary (Signed)
[facility] Discharge Summary   Patient ID: James Riley MRN: [id number] DOB/AGE: 66/01/1951 66 y.o.  Admit date: 09/07/2016 Discharge date: 09/13/2016  Admitting Diagnosis: Perforated appendicitis  Discharge Diagnosis Patient Active Problem List   Diagnosis Date Noted  . Perforated appendicitis 09/07/2016  . Diverticulitis of colon without hemorrhage 09/07/2015  . Abscess of groin, right 09/18/2012  . BLOOD IN STOOL 08/04/2010  . ADHESIVE CAPSULITIS, RIGHT 07/19/2010  . FOLLICULITIS 07/14/2010  . SHOULDER PAIN, RIGHT 07/14/2010  . INSOMNIA 07/14/2010  . GLUCOSE INTOLERANCE 04/20/2010  . FATIGUE 04/12/2010  . GAIT IMBALANCE 04/12/2010  . CEREBROVASCULAR ACCIDENT 01/31/2010  . HYPERLIPIDEMIA 12/23/2009  . ALCOHOL ABUSE 12/23/2009  . TOBACCO ABUSE 12/23/2009  . ERECTILE DYSFUNCTION, ORGANIC 12/23/2009  . ESSENTIAL HYPERTENSION, BENIGN 12/09/2009    Consultants None  Imaging: 09/07/16 - CT Abd/Pelvis W/ Contrast: acute ruptured appendicitis with small phlegmon/abscess seen about the cecum.  Procedures Dr. Abigail Miyamotoouglas Blackman (09/616) - Laparoscopic converted to open appendectomy, [facility]  Hospital Course:  65 y/o male [facility] 3 days of abdominal pain associated with nausea.  Physical exam significant for abdominal tenderness. WBC 15. CT scan above.  Patient was admitted, started on empiric IV antibiotics, and underwent procedure listed above. Tolerated procedure well and was transferred to the floor. Diet was advanced as tolerated.  On POD#4, the patient was having bowel movements, voiding well, tolerating diet, ambulating well, pain well controlled, vital signs stable, incisions c/d/i and felt stable for discharge home.  Patient will follow up in our office for staple removal and post-operative follow up. Blake drain with minimal serosanguinous output - d/c-ed prior to discharge.  Physical Exam: General:  Alert, NAD,  pleasant, comfortable Abd:  Soft, ST/ND, midline C/D/I, staples in place; drain with minimal output - d/c-ed prior to discharge.    Medication List    STOP taking these medications   cephALEXin 500 MG capsule Commonly known as:  KEFLEX     TAKE these medications   amLODipine-benazepril 10-20 MG capsule Commonly known as:  LOTREL Take 1 capsule by mouth daily.   amoxicillin-clavulanate 875-125 MG tablet Commonly known as:  AUGMENTIN Take 1 tablet by mouth 2 (two) times daily.   docusate sodium 100 MG capsule Commonly known as:  COLACE Take 1 capsule (100 mg total) by mouth at bedtime as needed for mild constipation.   multivitamin with minerals Tabs tablet Take 1 tablet by mouth daily.   oxyCODONE 5 MG immediate release tablet Commonly known as:  Oxy IR/ROXICODONE Take 1-2 tablets (5-10 mg total) by mouth every 6 (six) hours as needed for moderate pain.   polyethylene glycol powder powder Commonly known as:  GLYCOLAX/MIRALAX Take 17 g by mouth daily.        Follow-up Information    Shelly RubensteinBLACKMAN,DOUGLAS A, MD. Go on 09/25/2016.   Specialty:  General Surgery Why:  1:30 PM for post-operative follow-up. Please arrive 15 minutes early. Contact information: [address] [place] [address] [phone]        [facility] [place]. Go on 09/20/2016.   Specialty:  General Surgery Why:  at 9:30 AM for staple removal. please arrive 30 minutes early to get checked in and fill out any necessary paperwork.  Contact information: [address] [place] [id number] [phone]         Signed: Hosie SpangleElizabeth Evadene Wardrip, [facility] 09/13/2016, 2:21 PM Pager: [phone] Consults: [phone] Mon-Fri 7:00 am-4:30  pm Sat-Sun 7:00 am-11:30 am

## 2016-09-14 ENCOUNTER — Telehealth: Payer: Self-pay

## 2016-09-14 NOTE — Telephone Encounter (Signed)
This Case Manager placed an additional call to patient to discuss scheduling hospital follow-up appointment. Call placed to #[phone]; unable to reach. HIPPA compliant voicemail left requesting return call.

## 2016-09-17 ENCOUNTER — Telehealth: Payer: Self-pay

## 2016-09-17 NOTE — Telephone Encounter (Signed)
Call placed to the patient # [phone] (H) at the request of Wyatt HasteLorraine Ajel, RN - [facility] to discuss scheduling a hospital follow up appointment. The person who answered stated that he was Christa SeeRonald Escandon but when asked to confirm his birth date, inquired why that was needed. This CM explained why it was needed to confirm his identity and he stated that ' I don't need anything" and would not provide the birth date.   The call was ended at that time.   Call placed to Wyatt HasteLorraine Ajel, RN [facility]  #  [phone] and a voicemail message was left informing her of the attempt to contact the patient. Call back requested to CM cell  # [phone].

## 2016-09-18 ENCOUNTER — Telehealth: Payer: Self-pay

## 2016-09-18 NOTE — Telephone Encounter (Signed)
Call received from Wyatt HasteLorraine Ajel, RN  - [facility] and informed her of the attempts to contact the patient to schedule a hospital follow up appointment and noted that the patient would not confirm his birth date when this CM called yesterday.

## 2017-02-18 ENCOUNTER — Encounter: Payer: Self-pay | Admitting: Family Medicine

## 2017-02-18 ENCOUNTER — Ambulatory Visit: Payer: Medicare Other | Attending: Family Medicine | Admitting: Family Medicine

## 2017-02-18 VITALS — BP 153/78 | HR 73 | Temp 98.1°F | Resp 18 | Ht 70.0 in | Wt 180.2 lb

## 2017-02-18 DIAGNOSIS — I10 Essential (primary) hypertension: Secondary | ICD-10-CM | POA: Diagnosis not present

## 2017-02-18 DIAGNOSIS — F17200 Nicotine dependence, unspecified, uncomplicated: Secondary | ICD-10-CM

## 2017-02-18 DIAGNOSIS — Z79899 Other long term (current) drug therapy: Secondary | ICD-10-CM | POA: Insufficient documentation

## 2017-02-18 DIAGNOSIS — Z8673 Personal history of transient ischemic attack (TIA), and cerebral infarction without residual deficits: Secondary | ICD-10-CM | POA: Diagnosis not present

## 2017-02-18 DIAGNOSIS — N529 Male erectile dysfunction, unspecified: Secondary | ICD-10-CM | POA: Diagnosis not present

## 2017-02-18 DIAGNOSIS — F17210 Nicotine dependence, cigarettes, uncomplicated: Secondary | ICD-10-CM | POA: Insufficient documentation

## 2017-02-18 DIAGNOSIS — K5900 Constipation, unspecified: Secondary | ICD-10-CM | POA: Diagnosis not present

## 2017-02-18 MED ORDER — LOSARTAN POTASSIUM 25 MG PO TABS: 25.0000 mg | ORAL_TABLET | Freq: Every day | ORAL | 2 refills | Status: DC

## 2017-02-18 MED ORDER — TADALAFIL 20 MG PO TABS: 10.0000 mg | ORAL_TABLET | Freq: Every day | ORAL | 11 refills | Status: DC | PRN

## 2017-02-18 MED ORDER — NICOTINE 21 MG/24HR TD PT24: 21.0000 mg | MEDICATED_PATCH | Freq: Every day | TRANSDERMAL | 1 refills | Status: DC

## 2017-02-18 MED ORDER — AMLODIPINE BESYLATE 10 MG PO TABS: 10.0000 mg | ORAL_TABLET | Freq: Every day | ORAL | 2 refills | Status: DC

## 2017-02-18 MED ORDER — PSYLLIUM 28 % PO PACK: 1.0000 | PACK | Freq: Two times a day (BID) | ORAL | Status: DC | PRN

## 2017-02-18 NOTE — Progress Notes (Signed)
Subjective:  Patient ID: James Riley, male    DOB: 04-25-1951  Age: 66 y.o. MRN: [id number]  CC: Establish Care   [name] presents for   HTN: History of CVA 6 years ago. Denies any residual weakness.  Reports being without his blood pressure medications for 4 months. Denies any CP, SOB, or swelling of the BLE. Current smoker 1 pack a day for 50 years.  ED: 1 year history of difficulty being able to get an errection. Denies any difficulty with urination. Reports taking his friends Cialis medication once and it was effective. Asked about testosterone treatments. Benefits and risks discussed with patient. He declines at this time.  Outpatient Medications Prior to Visit  Medication Sig Dispense Refill  . docusate sodium (COLACE) 100 MG capsule Take 1 capsule (100 mg total) by mouth at bedtime as needed for mild constipation. (Patient not taking: Reported on 02/18/2017) 60 capsule 0  . Multiple Vitamin (MULTIVITAMIN WITH MINERALS) TABS tablet Take 1 tablet by mouth daily.    Marland Kitchen oxyCODONE (OXY IR/ROXICODONE) 5 MG immediate release tablet Take 1-2 tablets (5-10 mg total) by mouth every 6 (six) hours as needed for moderate pain. (Patient not taking: Reported on 02/18/2017) 30 tablet 0  . polyethylene glycol powder (GLYCOLAX/MIRALAX) powder Take 17 g by mouth daily. (Patient not taking: Reported on 02/18/2017) 850 g 3  . amLODipine-benazepril (LOTREL) 10-20 MG capsule Take 1 capsule by mouth daily. (Patient not taking: Reported on 02/18/2017) 30 capsule 6   No facility-administered medications prior to visit.     ROS Review of Systems  Eyes: Negative.   Respiratory: Negative.   Cardiovascular: Negative.   Gastrointestinal: Positive for constipation.  Genitourinary:       Erectile dysfunction   Skin: Negative.     Objective:  BP (!) 153/78 (BP Location: Left Arm, Patient Position: Sitting, Cuff Size: Normal)   Pulse 73   Temp 98.1 F (36.7 C) (Oral)   Resp 18   Ht 5\' 10"   (1.778 m)   Wt 180 lb 3.2 oz (81.7 kg)   SpO2 99%   BMI 25.86 kg/m   BP/Weight 02/18/2017 09/11/2016 01/04/2016  Systolic BP 153 188 128  Diastolic BP 78 102 68  Wt. (Lbs) 180.2 - 186  BMI 25.86 - 25.95   Physical Exam  Eyes: Conjunctivae are normal. Pupils are equal, round, and reactive to light.  Neck: No JVD present.  Cardiovascular: Normal rate, regular rhythm, normal heart sounds and intact distal pulses.   Pulmonary/Chest: Effort normal and breath sounds normal.  Abdominal: Soft. Bowel sounds are normal. There is tenderness (LLQ quadrant).  Skin: Skin is warm and dry.  Nursing note and vitals reviewed.   Assessment & Plan:   Problem List Items Addressed This Visit      Cardiovascular and Mediastinum   Hypertension - Primary   Relevant Medications   amLODipine (NORVASC) 10 MG tablet   losartan (COZAAR) 25 MG tablet   tadalafil (CIALIS) 20 MG tablet   Other Relevant Orders   BASIC METABOLIC PANEL WITH GFR   Lipid Panel   Microalbumin/Creatinine Ratio, Urine    Other Visit Diagnoses    Erectile dysfunction, unspecified erectile dysfunction type       Relevant Medications   tadalafil (CIALIS) 20 MG tablet   Constipation, unspecified constipation type       Relevant Medications   psyllium (METAMUCIL SMOOTH TEXTURE) 28 % packet   Ready to quit smoking  Relevant Medications   nicotine (NICODERM CQ - DOSED IN MG/24 HOURS) 21 mg/24hr patch      Meds ordered this encounter  Medications  . amLODipine (NORVASC) 10 MG tablet    Sig: Take 1 tablet (10 mg total) by mouth daily.    Dispense:  30 tablet    Refill:  2    Order Specific Question:   Supervising Provider    Answer:   Quentin AngstJEGEDE, OLUGBEMIGA E [id number]]  . losartan (COZAAR) 25 MG tablet    Sig: Take 1 tablet (25 mg total) by mouth daily.    Dispense:  30 tablet    Refill:  2    Order Specific Question:   Supervising Provider    Answer:   Quentin AngstJEGEDE, OLUGBEMIGA E [id number]]  . nicotine (NICODERM CQ - DOSED IN  MG/24 HOURS) 21 mg/24hr patch    Sig: Place 1 patch (21 mg total) onto the skin daily.    Dispense:  28 patch    Refill:  1    Order Specific Question:   Supervising Provider    Answer:   Quentin AngstJEGEDE, OLUGBEMIGA E [id number]]  . psyllium (METAMUCIL SMOOTH TEXTURE) 28 % packet    Sig: Take 1 packet by mouth 2 (two) times daily as needed.    Order Specific Question:   Supervising Provider    Answer:   Quentin AngstJEGEDE, OLUGBEMIGA E [id number]]  . tadalafil (CIALIS) 20 MG tablet    Sig: Take 0.5-1 tablets (10-20 mg total) by mouth daily as needed for erectile dysfunction.    Dispense:  5 tablet    Refill:  11    Order Specific Question:   Supervising Provider    Answer:   Quentin AngstJEGEDE, OLUGBEMIGA E [[id number]]    Follow-up: Return in about 2 weeks (around 03/04/2017) for blood pressure check with clinical pharmacist .   Lizbeth BarkMandesia R Larisha Vencill FNP

## 2017-02-18 NOTE — Progress Notes (Signed)
Patient is here for HTN  Patient denies pain today  Patient is not on any current med or any BP medication

## 2017-02-18 NOTE — Patient Instructions (Addendum)
Follow up blood pressure check with clinical pharmacist in 2 weeks.  Tadalafil tablets (Cialis) What is this medicine? TADALAFIL (tah DA la fil) is used to treat erection problems in men. It is also used for enlargement of the prostate gland in men, a condition called benign prostatic hyperplasia or BPH. This medicine improves urine flow and reduces BPH symptoms. This medicine can also treat both erection problems and BPH when they occur together. This medicine may be used for other purposes; ask your health care provider or pharmacist if you have questions. COMMON BRAND NAME(S): Cialis What should I tell my health care provider before I take this medicine? They need to know if you have any of these conditions: -bleeding disorders -eye or vision problems, including a rare inherited eye disease called retinitis pigmentosa -anatomical deformation of the penis, Peyronie's disease, or history of priapism (painful and prolonged erection) -heart disease, angina, a history of heart attack, irregular heart beats, or other heart problems -high or low blood pressure -history of blood diseases, like sickle cell anemia or leukemia -history of stomach bleeding -kidney disease -liver disease -stroke -an unusual or allergic reaction to tadalafil, other medicines, foods, dyes, or preservatives -pregnant or trying to get pregnant -breast-feeding How should I use this medicine? Take this medicine by mouth with a glass of water. Follow the directions on the prescription label. You may take this medicine with or without meals. When this medicine is used for erection problems, your doctor may prescribe it to be taken once daily or as needed. If you are taking the medicine as needed, you may be able to have sexual activity 30 minutes after taking it and for up to 36 hours after taking it. Whether you are taking the medicine as needed or once daily, you should not take more than one dose per day. If you are taking  this medicine for symptoms of benign prostatic hyperplasia (BPH) or to treat both BPH and an erection problem, take the dose once daily at about the same time each day. Do not take your medicine more often than directed. Talk to your pediatrician regarding the use of this medicine in children. Special care may be needed. Overdosage: If you think you have taken too much of this medicine contact a poison control center or emergency room at once. NOTE: This medicine is only for you. Do not share this medicine with others. What if I miss a dose? If you are taking this medicine as needed for erection problems, this does not apply. If you miss a dose while taking this medicine once daily for an erection problem, benign prostatic hyperplasia, or both, take it as soon as you remember, but do not take more than one dose per day. What may interact with this medicine? Do not take this medicine with any of the following medications: -nitrates like amyl nitrite, isosorbide dinitrate, isosorbide mononitrate, nitroglycerin -other medicines for erectile dysfunction like avanafil, sildenafil, vardenafil -other tadalafil products (Adcirca) -riociguat This medicine may also interact with the following medications: -certain drugs for high blood pressure -certain drugs for the treatment of HIV infection or AIDS -certain drugs used for fungal or yeast infections, like fluconazole, itraconazole, ketoconazole, and voriconazole -certain drugs used for seizures like carbamazepine, phenytoin, and phenobarbital -grapefruit juice -macrolide antibiotics like clarithromycin, erythromycin, troleandomycin -medicines for prostate problems -rifabutin, rifampin or rifapentine This list may not describe all possible interactions. Give your health care provider a list of all the medicines, herbs, non-prescription drugs, or dietary supplements  you use. Also tell them if you smoke, drink alcohol, or use illegal drugs. Some items may  interact with your medicine. What should I watch for while using this medicine? If you notice any changes in your vision while taking this drug, call your doctor or health care professional as soon as possible. Stop using this medicine and call your health care provider right away if you have a loss of sight in one or both eyes. Contact your doctor or health care professional right away if the erection lasts longer than 4 hours or if it becomes painful. This may be a sign of serious problem and must be treated right away to prevent permanent damage. If you experience symptoms of nausea, dizziness, chest pain or arm pain upon initiation of sexual activity after taking this medicine, you should refrain from further activity and call your doctor or health care professional as soon as possible. Do not drink alcohol to excess (examples, 5 glasses of wine or 5 shots of whiskey) when taking this medicine. When taken in excess, alcohol can increase your chances of getting a headache or getting dizzy, increasing your heart rate or lowering your blood pressure. Using this medicine does not protect you or your partner against HIV infection (the virus that causes AIDS) or other sexually transmitted diseases. What side effects may I notice from receiving this medicine? Side effects that you should report to your doctor or health care professional as soon as possible: -allergic reactions like skin rash, itching or hives, swelling of the face, lips, or tongue -breathing problems -changes in hearing -changes in vision -chest pain -fast, irregular heartbeat -prolonged or painful erection -seizures Side effects that usually do not require medical attention (report to your doctor or health care professional if they continue or are bothersome): -back pain -dizziness -flushing -headache -indigestion -muscle aches -nausea -stuffy or runny nose This list may not describe all possible side effects. Call your  doctor for medical advice about side effects. You may report side effects to FDA at 1-800-FDA-1088. Where should I keep my medicine? Keep out of the reach of children. Store at room temperature between 15 and 30 degrees C (59 and 86 degrees F). Throw away any unused medicine after the expiration date. NOTE: This sheet is a summary. It may not cover all possible information. If you have questions about this medicine, talk to your doctor, pharmacist, or health care provider.  2018 Elsevier/Gold Standard (2014-04-09 13:15:49)   Nicotine skin patches What is this medicine? NICOTINE (NIK oh teen) helps people stop smoking. The patches replace the nicotine found in cigarettes and help to decrease withdrawal effects. They are most effective when used in combination with a stop-smoking program. This medicine may be used for other purposes; ask your health care provider or pharmacist if you have questions. COMMON BRAND NAME(S): Habitrol, Nicoderm CQ, Nicotrol What should I tell my health care provider before I take this medicine? They need to know if you have any of these conditions: -diabetes -heart disease, angina, irregular heartbeat or previous heart attack -high blood pressure -lung disease, including asthma -overactive thyroid -pheochromocytoma -seizures or a history of seizures -skin problems, like eczema -stomach problems or ulcers -an unusual or allergic reaction to nicotine, adhesives, other medicines, foods, dyes, or preservatives -pregnant or trying to get pregnant -breast-feeding How should I use this medicine? This medicine is for use on the skin. Follow the directions that come with the patches. Find an area of skin on your upper arm,  chest, or back that is clean, dry, greaseless, undamaged and hairless. Wash hands with plain soap and water. Do not use anything that contains aloe, lanolin or glycerin as these may prevent the patch from sticking. Dry thoroughly. Remove the patch  from the sealed pouch. Do not try to cut or trim the patch. Using your palm, press the patch firmly in place for 10 seconds to make sure that there is good contact with your skin. After applying the patch, wash your hands. Change the patch every day, keeping to a regular schedule. When you apply a new patch, use a new area of skin. Wait at least 1 week before using the same area again. Talk to your pediatrician regarding the use of this medicine in children. Special care may be needed. Overdosage: If you think you have taken too much of this medicine contact a poison control center or emergency room at once. NOTE: This medicine is only for you. Do not share this medicine with others. What if I miss a dose? If you forget to replace a patch, use it as soon as you can. Only use one patch at a time and do not leave on the skin for longer than directed. If a patch falls off, you can replace it, but keep to your schedule and remove the patch at the right time. What may interact with this medicine? -medicines for asthma -medicines for blood pressure -medicines for mental depression This list may not describe all possible interactions. Give your health care provider a list of all the medicines, herbs, non-prescription drugs, or dietary supplements you use. Also tell them if you smoke, drink alcohol, or use illegal drugs. Some items may interact with your medicine. What should I watch for while using this medicine? You should begin using the nicotine patch the day you stop smoking. It is okay if you do not succeed at your attempt to quit and have a cigarette. You can still continue your quit attempt and keep using the product as directed. Just throw away your cigarettes and get back to your quit plan. You can keep the patch in place during swimming, bathing, and showering. If your patch falls off during these activities, replace it. When you first apply the patch, your skin may itch or burn. This should go away  soon. When you remove a patch, the skin may look red, but this should only last for a few days. Call your doctor or health care professional if skin redness does not go away after 4 days, if your skin swells, or if you get a rash. If you are a diabetic and you quit smoking, the effects of insulin may be increased and you may need to reduce your insulin dose. Check with your doctor or health care professional about how you should adjust your insulin dose. If you are going to have a magnetic resonance imaging (MRI) procedure, tell your MRI technician if you have this patch on your body. It must be removed before a MRI. What side effects may I notice from receiving this medicine? Side effects that you should report to your doctor or health care professional as soon as possible: -allergic reactions like skin rash, itching or hives, swelling of the face, lips, or tongue -breathing problems -changes in hearing -changes in vision -chest pain -cold sweats -confusion -fast, irregular heartbeat -feeling faint or lightheaded, falls -headache -increased saliva -skin redness that lasts more than 4 days -stomach pain -signs and symptoms of nicotine overdose like nausea;  vomiting; dizziness; weakness; and rapid heartbeat Side effects that usually do not require medical attention (report to your doctor or health care professional if they continue or are bothersome): -diarrhea -dry mouth -hiccups -irritability -nervousness or restlessness -trouble sleeping or vivid dreams This list may not describe all possible side effects. Call your doctor for medical advice about side effects. You may report side effects to FDA at 1-800-FDA-1088. Where should I keep my medicine? Keep out of the reach of children. Store at room temperature between 20 and 25 degrees C (68 and 77 degrees F). Protect from heat and light. Store in Tax inspector until ready to use. Throw away unused medicine after the expiration  date. When you remove a patch, fold with sticky sides together; put in an empty opened pouch and throw away. NOTE: This sheet is a summary. It may not cover all possible information. If you have questions about this medicine, talk to your doctor, pharmacist, or health care provider.  2018 Elsevier/Gold Standard (2014-10-18 15:46:21)   Hypertension Hypertension is another name for high blood pressure. High blood pressure forces your heart to work harder to pump blood. This can cause problems over time. There are two numbers in a blood pressure reading. There is a top number (systolic) over a bottom number (diastolic). It is best to have a blood pressure below 120/80. Healthy choices can help lower your blood pressure. You may need medicine to help lower your blood pressure if:  Your blood pressure cannot be lowered with healthy choices.  Your blood pressure is higher than 130/80. Follow these instructions at home: Eating and drinking   If directed, follow the DASH eating plan. This diet includes:  Filling half of your plate at each meal with fruits and vegetables.  Filling one quarter of your plate at each meal with whole grains. Whole grains include whole wheat pasta, brown rice, and whole grain bread.  Eating or drinking low-fat dairy products, such as skim milk or low-fat yogurt.  Filling one quarter of your plate at each meal with low-fat (lean) proteins. Low-fat proteins include fish, skinless chicken, eggs, beans, and tofu.  Avoiding fatty meat, cured and processed meat, or chicken with skin.  Avoiding premade or processed food.  Eat less than 1,500 mg of salt (sodium) a day.  Limit alcohol use to no more than 1 drink a day for nonpregnant women and 2 drinks a day for men. One drink equals 12 oz of beer, 5 oz of wine, or 1 oz of hard liquor. Lifestyle   Work with your doctor to stay at a healthy weight or to lose weight. Ask your doctor what the best weight is for you.  Get  at least 30 minutes of exercise that causes your heart to beat faster (aerobic exercise) most days of the week. This may include walking, swimming, or biking.  Get at least 30 minutes of exercise that strengthens your muscles (resistance exercise) at least 3 days a week. This may include lifting weights or pilates.  Do not use any products that contain nicotine or tobacco. This includes cigarettes and e-cigarettes. If you need help quitting, ask your doctor.  Check your blood pressure at home as told by your doctor.  Keep all follow-up visits as told by your doctor. This is important. Medicines   Take over-the-counter and prescription medicines only as told by your doctor. Follow directions carefully.  Do not skip doses of blood pressure medicine. The medicine does not work as well if  you skip doses. Skipping doses also puts you at risk for problems.  Ask your doctor about side effects or reactions to medicines that you should watch for. Contact a doctor if:  You think you are having a reaction to the medicine you are taking.  You have headaches that keep coming back (recurring).  You feel dizzy.  You have swelling in your ankles.  You have trouble with your vision. Get help right away if:  You get a very bad headache.  You start to feel confused.  You feel weak or numb.  You feel faint.  You get very bad pain in your:  Chest.  Belly (abdomen).  You throw up (vomit) more than once.  You have trouble breathing. Summary  Hypertension is another name for high blood pressure.  Making healthy choices can help lower blood pressure. If your blood pressure cannot be controlled with healthy choices, you may need to take medicine. This information is not intended to replace advice given to you by your health care provider. Make sure you discuss any questions you have with your health care provider. Document Released: 05/07/2008 Document Revised: 10/17/2016 Document Reviewed:  10/17/2016 Elsevier Interactive Patient Education  2017 Elsevier Inc.    Erectile Dysfunction Erectile dysfunction (ED) is the inability to get or keep an erection in order to have sexual intercourse. Erectile dysfunction may include:  Inability to get an erection.  Lack of enough hardness of the erection to allow penetration.  Loss of the erection before sex is finished. What are the causes? This condition may be caused by:  Certain medicines, such as:  Pain relievers.  Antihistamines.  Antidepressants.  Blood pressure medicines.  Water pills (diuretics).  Ulcer medicines.  Muscle relaxants.  Drugs.  Excessive drinking.  Psychological causes, such as:  Anxiety.  Depression.  Sadness.  Exhaustion.  Performance fear.  Stress.  Physical causes, such as:  Artery problems. This may include diabetes, smoking, liver disease, or atherosclerosis.  High blood pressure.  Hormonal problems, such as low testosterone.  Obesity.  Nerve problems. This may include back or pelvic injuries, diabetes mellitus, multiple sclerosis, or Parkinson disease. What are the signs or symptoms? Symptoms of this condition include:  Inability to get an erection.  Lack of enough hardness of the erection to allow penetration.  Loss of the erection before sex is finished.  Normal erections at some times, but with frequent unsatisfactory episodes.  Low sexual satisfaction in either partner due to erection problems.  A curved penis occurring with erection. The curve may cause pain or the penis may be too curved to allow for intercourse.  Never having nighttime erections. How is this diagnosed? This condition is often diagnosed by:  Performing a physical exam to find other diseases or specific problems with the penis.  Asking you detailed questions about the problem.  Performing blood tests to check for diabetes mellitus or to measure hormone levels.  Performing other  tests to check for underlying health conditions.  Performing an ultrasound exam to check for scarring.  Performing a test to check blood flow to the penis.  Doing a sleep study at home to measure nighttime erections. How is this treated? This condition may be treated by:  Medicine taken by mouth to help you achieve an erection (oral medicine).  Hormone replacement therapy to replace low testosterone levels.  Medicine that is injected into the penis. Your health care provider may instruct you how to give yourself these injections at home.  Vacuum pump. This is a pump with a ring on it. The pump and ring are placed on the penis and used to create pressure that helps the penis become erect.  Penile implant surgery. In this procedure, you may receive:  An inflatable implant. This consists of cylinders, a pump, and a reservoir. The cylinders can be inflated with a fluid that helps to create an erection, and they can be deflated after intercourse.  A semi-rigid implant. This consists of two silicone rubber rods. The rods provide some rigidity. They are also flexible, so the penis can both curve downward in its normal position and become straight for sexual intercourse.  Blood vessel surgery, to improve blood flow to the penis. During this procedure, a blood vessel from a different part of the body is placed into the penis to allow blood to flow around (bypass) damaged or blocked blood vessels.  Lifestyle changes, such as exercising more, losing weight, and quitting smoking. Follow these instructions at home: Medicines   Take over-the-counter and prescription medicines only as told by your health care provider. Do not increase the dosage without first discussing it with your health care provider.  If you are using self-injections, perform injections as directed by your health care provider. Make sure to avoid any veins that are on the surface of the penis. After giving an injection, apply  pressure to the injection site for 5 minutes. General instructions   Exercise regularly, as directed by your health care provider. Work with your health care provider to lose weight, if needed.  Do not use any products that contain nicotine or tobacco, such as cigarettes and e-cigarettes. If you need help quitting, ask your health care provider.  Before using a vacuum pump, read the instructions that come with the pump and discuss any questions with your health care provider.  Keep all follow-up visits as told by your health care provider. This is important. Contact a health care provider if:  You feel nauseous.  You vomit. Get help right away if:  You are taking oral or injectable medicines and you have an erection that lasts longer than 4 hours. If your health care provider is unavailable, go to the nearest emergency room for evaluation. An erection that lasts much longer than 4 hours can result in permanent damage to your penis.  You have severe pain in your groin or abdomen.  You develop redness or severe swelling of your penis.  You have redness spreading up into your groin or lower abdomen.  You are unable to urinate.  You experience chest pain or a rapid heart beat (palpitations) after taking oral medicines. Summary  Erectile dysfunction (ED) is the inability to get or keep an erection during sexual intercourse. This problem can usually be treated successfully.  This condition is diagnosed based on a physical exam, your symptoms, and tests to determine the cause. Treatment varies depending on the cause, and may include medicines, hormone therapy, surgery, or vacuum pump.  You may need follow-up visits to make sure that you are using your medicines or devices correctly.  Get help right away if you are taking or injecting medicines and you have an erection that lasts longer than 4 hours. This information is not intended to replace advice given to you by your health care  provider. Make sure you discuss any questions you have with your health care provider. Document Released: 11/16/2000 Document Revised: 12/05/2016 Document Reviewed: 12/05/2016 Elsevier Interactive Patient Education  2017 [employer]

## 2017-03-05 ENCOUNTER — Other Ambulatory Visit: Payer: Self-pay | Admitting: *Deleted

## 2017-03-05 ENCOUNTER — Encounter: Payer: Self-pay | Admitting: Pharmacist

## 2017-03-05 ENCOUNTER — Ambulatory Visit: Payer: Medicare Other | Attending: Family Medicine | Admitting: Pharmacist

## 2017-03-05 VITALS — BP 128/66 | HR 70

## 2017-03-05 DIAGNOSIS — N529 Male erectile dysfunction, unspecified: Secondary | ICD-10-CM

## 2017-03-05 DIAGNOSIS — Z79899 Other long term (current) drug therapy: Secondary | ICD-10-CM | POA: Diagnosis not present

## 2017-03-05 DIAGNOSIS — I10 Essential (primary) hypertension: Secondary | ICD-10-CM | POA: Diagnosis not present

## 2017-03-05 DIAGNOSIS — Z87891 Personal history of nicotine dependence: Secondary | ICD-10-CM | POA: Diagnosis not present

## 2017-03-05 MED ORDER — ADULT MULTIVITAMIN W/MINERALS CH: 1.0000 | ORAL_TABLET | Freq: Every day | ORAL | 3 refills | Status: DC

## 2017-03-05 MED ORDER — TADALAFIL 20 MG PO TABS: 10.0000 mg | ORAL_TABLET | Freq: Every day | ORAL | 11 refills | Status: DC | PRN

## 2017-03-05 MED ORDER — AMLODIPINE BESYLATE 10 MG PO TABS: 10.0000 mg | ORAL_TABLET | Freq: Every day | ORAL | 2 refills | Status: DC

## 2017-03-05 MED ORDER — LOSARTAN POTASSIUM 25 MG PO TABS: 25.0000 mg | ORAL_TABLET | Freq: Every day | ORAL | 2 refills | Status: DC

## 2017-03-05 MED FILL — LOSARTAN POTASSIUM 25 MG TA: 25 | 30 days supply | Qty: 30 | Fill #0

## 2017-03-05 MED FILL — AMLODIPINE BESYLATE 10 MG T: 10 | 30 days supply | Qty: 30 | Fill #0

## 2017-03-05 MED FILL — !CIALIS 20 MG TABLET: 20 | 30 days supply | Qty: 2 | Fill #0

## 2017-03-05 NOTE — Progress Notes (Signed)
S:    No chief complaint on file.  Patient arrives in good spirits.  Presents to the clinic for hypertension evaluation. Patient was last seen by Arrie Senate and referred on 02/18/17.    Patient reports adherence with medications. Patient was not familiar with names of medications but able to describe each medications shape and color.  Current BP Medications include: amLODipine (NORVASC) 10 MG tablet daily, losartan (COZAAR) 25 MG tablet daily Antihypertensives tried in the past include: amlodipine-benazepril 10-20 MG, hydrochlorothiazide 25 MG daily  Patient reports quitting smoking one week ago.  O:  Last 3 Office BP readings: BP Readings from Last 3 Encounters: 03/05/17: 128/66 02/18/17: (!) 153/78 09/11/16: (!) 188/102  Pulse: 70 bpm (03/05/17)  A/P: HTN is controlled on current medications. No changes made at this time. Encouraged patient to take all of his medications as prescribed and to not miss any doses. Patient verbalized understanding. Patient was instructed to continue nicotine patches and to utilize behavioral changes when needed for continued success while quitting. Patient was encouraged to come back for smoking cessation visit if desired with Viann Fish.   Results reviewed and written information provided. Total time in face-to-face counseling 20 minutes.   F/U Clinic Visit with Arrie Senate in May.  Patient seen with Cheral Bay, PharmD Candidate

## 2017-03-05 NOTE — Patient Instructions (Addendum)
Thanks for coming to see [place]!  Great job quitting smoking!!!! You can get a refill of the patches at [facility]  If you need help staying quit - please come back and see [place]!!!   Steps to Quit Smoking Smoking tobacco can be harmful to your health and can affect almost every organ in your body. Smoking puts you, and those around you, at risk for developing many serious chronic diseases. Quitting smoking is difficult, but it is one of the best things that you can do for your health. It is never too late to quit. What are the benefits of quitting smoking? When you quit smoking, you lower your risk of developing serious diseases and conditions, such as:  Lung cancer or lung disease, such as COPD.  Heart disease.  Stroke.  Heart attack.  Infertility.  Osteoporosis and bone fractures. Additionally, symptoms such as coughing, wheezing, and shortness of breath may get better when you quit. You may also find that you get sick less often because your body is stronger at fighting off colds and infections. If you are pregnant, quitting smoking can help to reduce your chances of having a baby of low birth weight. How do I get ready to quit? When you decide to quit smoking, create a plan to make sure that you are successful. Before you quit:  Pick a date to quit. Set a date within the next two weeks to give you time to prepare.  Write down the reasons why you are quitting. Keep this list in places where you will see it often, such as on your bathroom mirror or in your car or wallet.  Identify the people, places, things, and activities that make you want to smoke (triggers) and avoid them. Make sure to take these actions:  Throw away all cigarettes at home, at work, and in your car.  Throw away smoking accessories, such as Set designer.  Clean your car and make sure to empty the ashtray.  Clean your home, including curtains and carpets.  Tell your family, friends, and coworkers that you  are quitting. Support from your loved ones can make quitting easier.  Talk with your health care provider about your options for quitting smoking.  Find out what treatment options are covered by your health insurance. What strategies can I use to quit smoking? Talk with your healthcare provider about different strategies to quit smoking. Some strategies include:  Quitting smoking altogether instead of gradually lessening how much you smoke over a period of time. Research shows that quitting "cold [place]" is more successful than gradually quitting.  Attending in-person counseling to help you build problem-solving skills. You are more likely to have success in quitting if you attend several counseling sessions. Even short sessions of 10 minutes can be effective.  Finding resources and support systems that can help you to quit smoking and remain smoke-free after you quit. These resources are most helpful when you use them often. They can include:  Online chats with a Veterinary surgeon.  Telephone quitlines.  Printed Materials engineer.  Support groups or group counseling.  Text messaging programs.  Mobile phone applications.  Taking medicines to help you quit smoking. (If you are pregnant or breastfeeding, talk with your health care provider first.) Some medicines contain nicotine and some do not. Both types of medicines help with cravings, but the medicines that include nicotine help to relieve withdrawal symptoms. Your health care provider may recommend:  Nicotine patches, gum, or lozenges.  Nicotine inhalers or  sprays.  Non-nicotine medicine that is taken by mouth. Talk with your health care provider about combining strategies, such as taking medicines while you are also receiving in-person counseling. Using these two strategies together makes you more likely to succeed in quitting than if you used either strategy on its own. If you are pregnant or breastfeeding, talk with your health care  provider about finding counseling or other support strategies to quit smoking. Do not take medicine to help you quit smoking unless told to do so by your health care provider. What things can I do to make it easier to quit? Quitting smoking might feel overwhelming at first, but there is a lot that you can do to make it easier. Take these important actions:  Reach out to your family and friends and ask that they support and encourage you during this time. Call telephone quitlines, reach out to support groups, or work with a counselor for support.  Ask people who smoke to avoid smoking around you.  Avoid places that trigger you to smoke, such as bars, parties, or smoke-break areas at work.  Spend time around people who do not smoke.  Lessen stress in your life, because stress can be a smoking trigger for some people. To lessen stress, try:  Exercising regularly.  Deep-breathing exercises.  Yoga.  Meditating.  Performing a body scan. This involves closing your eyes, scanning your body from head to toe, and noticing which parts of your body are particularly tense. Purposefully relax the muscles in those areas.  Download or purchase mobile phone or tablet apps (applications) that can help you stick to your quit plan by providing reminders, tips, and encouragement. There are many free apps, such as QuitGuide from the [employer] developer for Disease Control and Prevention). You can find other support for quitting smoking (smoking cessation) through smokefree.gov and other websites. How will I feel when I quit smoking? Within the first 24 hours of quitting smoking, you may start to feel some withdrawal symptoms. These symptoms are usually most noticeable 2-3 days after quitting, but they usually do not last beyond 2-3 weeks. Changes or symptoms that you might experience include:  Mood swings.  Restlessness, anxiety, or irritation.  Difficulty concentrating.  Dizziness.  Strong cravings for  sugary foods in addition to nicotine.  Mild weight gain.  Constipation.  Nausea.  Coughing or a sore throat.  Changes in how your medicines work in your body.  A depressed mood.  Difficulty sleeping (insomnia). After the first 2-3 weeks of quitting, you may start to notice more positive results, such as:  Improved sense of smell and taste.  Decreased coughing and sore throat.  Slower heart rate.  Lower blood pressure.  Clearer skin.  The ability to breathe more easily.  Fewer sick days. Quitting smoking is very challenging for most people. Do not get discouraged if you are not successful the first time. Some people need to make many attempts to quit before they achieve long-term success. Do your best to stick to your quit plan, and talk with your health care provider if you have any questions or concerns. This information is not intended to replace advice given to you by your health care provider. Make sure you discuss any questions you have with your health care provider. Document Released: 11/13/2001 Document Revised: 07/17/2016 Document Reviewed: 04/05/2015 Elsevier Interactive Patient Education  2017 [employer].

## 2017-04-04 MED FILL — AMLODIPINE BESYLATE 10 MG T: 10 | 30 days supply | Qty: 30 | Fill #1

## 2017-04-05 ENCOUNTER — Encounter: Payer: Self-pay | Admitting: Family Medicine

## 2017-04-05 ENCOUNTER — Ambulatory Visit: Payer: Medicare Other | Attending: Family Medicine | Admitting: Family Medicine

## 2017-04-05 VITALS — BP 138/70 | HR 73 | Temp 98.2°F | Resp 18 | Ht 70.0 in | Wt 183.0 lb

## 2017-04-05 DIAGNOSIS — I10 Essential (primary) hypertension: Secondary | ICD-10-CM | POA: Diagnosis not present

## 2017-04-05 DIAGNOSIS — Z8673 Personal history of transient ischemic attack (TIA), and cerebral infarction without residual deficits: Secondary | ICD-10-CM | POA: Diagnosis not present

## 2017-04-05 DIAGNOSIS — F17210 Nicotine dependence, cigarettes, uncomplicated: Secondary | ICD-10-CM | POA: Diagnosis not present

## 2017-04-05 DIAGNOSIS — Z79899 Other long term (current) drug therapy: Secondary | ICD-10-CM | POA: Insufficient documentation

## 2017-04-05 LAB — POCT UA - MICROALBUMIN
Albumin/Creatinine Ratio, Urine, POC: <30
CREATININE, POC: 300 mg/dL
Microalbumin Ur, POC: 30 mg/L

## 2017-04-05 MED ORDER — LOSARTAN POTASSIUM 50 MG PO TABS: 50.0000 mg | ORAL_TABLET | Freq: Every day | ORAL | 2 refills | Status: DC

## 2017-04-05 MED ORDER — AMLODIPINE BESYLATE 10 MG PO TABS: 10.0000 mg | ORAL_TABLET | Freq: Every day | ORAL | 2 refills | Status: DC

## 2017-04-05 MED FILL — LOSARTAN POTASSIUM 50 MG TA: 50 | 30 days supply | Qty: 30 | Fill #0

## 2017-04-05 NOTE — Progress Notes (Signed)
Patient is here for f/up  Patient denies pain for today  Patient stated that his Cialis medication is not helping

## 2017-04-05 NOTE — Patient Instructions (Signed)
Hypertension °Hypertension is another name for high blood pressure. High blood pressure forces your heart to work harder to pump blood. This can cause problems over time. °There are two numbers in a blood pressure reading. There is a top number (systolic) over a bottom number (diastolic). It is best to have a blood pressure below 120/80. Healthy choices can help lower your blood pressure. You may need medicine to help lower your blood pressure if: °· Your blood pressure cannot be lowered with healthy choices. °· Your blood pressure is higher than 130/80. °Follow these instructions at home: °Eating and drinking  °· If directed, follow the DASH eating plan. This diet includes: °¨ Filling half of your plate at each meal with fruits and vegetables. °¨ Filling one quarter of your plate at each meal with whole grains. Whole grains include whole wheat pasta, brown rice, and whole grain bread. °¨ Eating or drinking low-fat dairy products, such as skim milk or low-fat yogurt. °¨ Filling one quarter of your plate at each meal with low-fat (lean) proteins. Low-fat proteins include fish, skinless chicken, eggs, beans, and tofu. °¨ Avoiding fatty meat, cured and processed meat, or chicken with skin. °¨ Avoiding premade or processed food. °· Eat less than 1,500 mg of salt (sodium) a day. °· Limit alcohol use to no more than 1 drink a day for nonpregnant women and 2 drinks a day for men. One drink equals 12 oz of beer, 5 oz of wine, or 1½ oz of hard liquor. °Lifestyle  °· Work with your doctor to stay at a healthy weight or to lose weight. Ask your doctor what the best weight is for you. °· Get at least 30 minutes of exercise that causes your heart to beat faster (aerobic exercise) most days of the week. This may include walking, swimming, or biking. °· Get at least 30 minutes of exercise that strengthens your muscles (resistance exercise) at least 3 days a week. This may include lifting weights or pilates. °· Do not use any  products that contain nicotine or tobacco. This includes cigarettes and e-cigarettes. If you need help quitting, ask your doctor. °· Check your blood pressure at home as told by your doctor. °· Keep all follow-up visits as told by your doctor. This is important. °Medicines  °· Take over-the-counter and prescription medicines only as told by your doctor. Follow directions carefully. °· Do not skip doses of blood pressure medicine. The medicine does not work as well if you skip doses. Skipping doses also puts you at risk for problems. °· Ask your doctor about side effects or reactions to medicines that you should watch for. °Contact a doctor if: °· You think you are having a reaction to the medicine you are taking. °· You have headaches that keep coming back (recurring). °· You feel dizzy. °· You have swelling in your ankles. °· You have trouble with your vision. °Get help right away if: °· You get a very bad headache. °· You start to feel confused. °· You feel weak or numb. °· You feel faint. °· You get very bad pain in your: °¨ Chest. °¨ Belly (abdomen). °· You throw up (vomit) more than once. °· You have trouble breathing. °Summary °· Hypertension is another name for high blood pressure. °· Making healthy choices can help lower blood pressure. If your blood pressure cannot be controlled with healthy choices, you may need to take medicine. °This information is not intended to replace advice given to you by your   health care provider. Make sure you discuss any questions you have with your health care provider. °Document Released: 05/07/2008 Document Revised: 10/17/2016 Document Reviewed: 10/17/2016 °Elsevier Interactive Patient Education © 2017 Elsevier Inc. ° °

## 2017-04-05 NOTE — Progress Notes (Signed)
Subjective:  Patient ID: James Riley, male    DOB: June 25, 1951  Age: 66 y.o. MRN: [id number]  CC: Hypertension   HPI LUCERO IDE presents for     Subjective:  Patient ID: James Riley, male    DOB: 08-13-1951  Age: 66 y.o. MRN: [id number]  CC: Hypertension   HPI REDDING CLOE presents for   HTN: History of CVA 6 years ago. Denies any residual weakness. Reports adherence with his blood pressure medications. Denies any CP, SOB, or swelling of the BLE, dizziness, or syncope. Current smoker 1 pack a day for 50 years.  ED: Reports using Cialis but reports not having any more. He received samples at last office visit. He denies any difficulty with urination or dysuria. Explained to patient he has refills available. He is requesting additional samples.     Outpatient Medications Prior to Visit  Medication Sig Dispense Refill  . tadalafil (CIALIS) 20 MG tablet Take 0.5-1 tablets (10-20 mg total) by mouth daily as needed for erectile dysfunction. 5 tablet 11  . amLODipine (NORVASC) 10 MG tablet Take 1 tablet (10 mg total) by mouth daily. 30 tablet 2  . losartan (COZAAR) 25 MG tablet Take 1 tablet (25 mg total) by mouth daily. 30 tablet 2  . docusate sodium (COLACE) 100 MG capsule Take 1 capsule (100 mg total) by mouth at bedtime as needed for mild constipation. (Patient not taking: Reported on 02/18/2017) 60 capsule 0  . Multiple Vitamin (MULTIVITAMIN WITH MINERALS) TABS tablet Take 1 tablet by mouth daily. 30 tablet 3  . nicotine (NICODERM CQ - DOSED IN MG/24 HOURS) 21 mg/24hr patch Place 1 patch (21 mg total) onto the skin daily. 28 patch 1  . oxyCODONE (OXY IR/ROXICODONE) 5 MG immediate release tablet Take 1-2 tablets (5-10 mg total) by mouth every 6 (six) hours as needed for moderate pain. (Patient not taking: Reported on 02/18/2017) 30 tablet 0  . polyethylene glycol powder (GLYCOLAX/MIRALAX) powder Take 17 g by mouth daily. (Patient not taking: Reported on 02/18/2017) 850 g 3    . psyllium (METAMUCIL SMOOTH TEXTURE) 28 % packet Take 1 packet by mouth 2 (two) times daily as needed.     No facility-administered medications prior to visit.     ROS Review of Systems  Constitutional: Negative.   Eyes: Negative.   Respiratory: Negative.   Cardiovascular: Negative.   Gastrointestinal: Negative.   Skin: Negative.   Neurological: Negative.     Objective:  BP 138/70 (BP Location: Left Arm, Patient Position: Sitting, Cuff Size: Normal)   Pulse 73   Temp 98.2 F (36.8 C) (Oral)   Resp 18   Ht 5\' 10"  (1.778 m)   Wt 183 lb (83 kg)   SpO2 99%   BMI 26.26 kg/m   BP/Weight 04/05/2017 03/05/2017 02/18/2017  Systolic BP 138 128 153  Diastolic BP 70 66 78  Wt. (Lbs) 183 - 180.2  BMI 26.26 - 25.86    Physical Exam  Constitutional: He is oriented to person, place, and time. He appears well-developed and well-nourished.  Eyes: Conjunctivae are normal. Pupils are equal, round, and reactive to light.  Neck: Normal range of motion. Neck supple. No JVD present.  Cardiovascular: Normal rate, regular rhythm, normal heart sounds and intact distal pulses.   Pulmonary/Chest: Effort normal and breath sounds normal.  Abdominal: Soft. Bowel sounds are normal.  Neurological: He is alert and oriented to person, place, and time.  Skin: Skin is warm and dry.  Nursing note and vitals reviewed.  Assessment & Plan:   Problem List Items Addressed This Visit      Cardiovascular and Mediastinum   Hypertension - Primary   BP well controlled on current dosage.    Relevant Medications   losartan (COZAAR) 50 MG tablet   amLODipine (NORVASC) 10 MG tablet   Other Relevant Orders   POCT UA - Microalbumin (Completed)      Meds ordered this encounter  Medications  . losartan (COZAAR) 50 MG tablet    Sig: Take 1 tablet (50 mg total) by mouth daily.    Dispense:  30 tablet    Refill:  2    Order Specific Question:   Supervising Provider    Answer:   Quentin AngstJEGEDE, OLUGBEMIGA E [id number]]   . amLODipine (NORVASC) 10 MG tablet    Sig: Take 1 tablet (10 mg total) by mouth daily.    Dispense:  30 tablet    Refill:  2    Order Specific Question:   Supervising Provider    Answer:   Quentin AngstJEGEDE, OLUGBEMIGA E [id number]]    Follow-up: Return in about 3 months (around 07/06/2017) for HTN.   Lizbeth BarkMandesia R Tinslee Klare FNP

## 2017-04-17 MED FILL — !CIALIS 20 MG TABLET: 20 | 10 days supply | Qty: 2 | Fill #1

## 2017-05-15 ENCOUNTER — Other Ambulatory Visit: Payer: Self-pay | Admitting: Family Medicine

## 2017-05-15 DIAGNOSIS — I10 Essential (primary) hypertension: Secondary | ICD-10-CM

## 2017-05-15 MED FILL — !CIALIS 20 MG TABLET: 20 | 10 days supply | Qty: 2 | Fill #2

## 2017-05-15 MED FILL — LOSARTAN POTASSIUM 50 MG TA: 50 | 30 days supply | Qty: 30 | Fill #1

## 2017-05-15 MED FILL — AMLODIPINE BESYLATE 10 MG T: 10 | 30 days supply | Qty: 30 | Fill #2

## 2017-05-23 ENCOUNTER — Ambulatory Visit: Payer: Medicare Other | Attending: Family Medicine

## 2017-06-11 ENCOUNTER — Other Ambulatory Visit: Payer: Self-pay | Admitting: *Deleted

## 2017-06-11 MED ORDER — SILDENAFIL CITRATE 100 MG PO TABS: 50.0000 mg | ORAL_TABLET | Freq: Every day | ORAL | 3 refills | Status: DC | PRN

## 2017-06-11 NOTE — Telephone Encounter (Signed)
PRINTED FOR PASS PROGRAM 

## 2017-06-20 ENCOUNTER — Other Ambulatory Visit: Payer: Self-pay | Admitting: Family Medicine

## 2017-06-20 DIAGNOSIS — I10 Essential (primary) hypertension: Secondary | ICD-10-CM

## 2017-06-20 MED FILL — AMLODIPINE BESYLATE 10 MG T: 10 | 30 days supply | Qty: 30 | Fill #0

## 2017-06-20 MED FILL — LOSARTAN POTASSIUM 50 MG TA: 50 | 30 days supply | Qty: 30 | Fill #2

## 2017-06-21 ENCOUNTER — Other Ambulatory Visit: Payer: Self-pay | Admitting: Family Medicine

## 2017-06-21 DIAGNOSIS — N529 Male erectile dysfunction, unspecified: Secondary | ICD-10-CM

## 2017-06-21 MED ORDER — SILDENAFIL CITRATE 100 MG PO TABS: 50.0000 mg | ORAL_TABLET | Freq: Every day | ORAL | 3 refills | Status: DC | PRN

## 2017-06-24 MED FILL — !VIAGRA 100MG TABLET: 100 | 30 days supply | Qty: 5 | Fill #0

## 2017-06-26 ENCOUNTER — Other Ambulatory Visit: Payer: Self-pay | Admitting: Family Medicine

## 2017-06-28 ENCOUNTER — Ambulatory Visit: Payer: Medicare Other | Attending: Family Medicine | Admitting: Family Medicine

## 2017-06-28 ENCOUNTER — Encounter: Payer: Self-pay | Admitting: Family Medicine

## 2017-06-28 VITALS — BP 135/66 | HR 68 | Temp 97.8°F | Resp 18 | Ht 70.0 in | Wt 187.2 lb

## 2017-06-28 DIAGNOSIS — L663 Perifolliculitis capitis abscedens: Secondary | ICD-10-CM | POA: Diagnosis not present

## 2017-06-28 DIAGNOSIS — H6123 Impacted cerumen, bilateral: Secondary | ICD-10-CM

## 2017-06-28 DIAGNOSIS — I10 Essential (primary) hypertension: Secondary | ICD-10-CM | POA: Diagnosis not present

## 2017-06-28 DIAGNOSIS — Z79899 Other long term (current) drug therapy: Secondary | ICD-10-CM | POA: Diagnosis not present

## 2017-06-28 DIAGNOSIS — Z8673 Personal history of transient ischemic attack (TIA), and cerebral infarction without residual deficits: Secondary | ICD-10-CM | POA: Insufficient documentation

## 2017-06-28 DIAGNOSIS — L03811 Cellulitis of head [any part, except face]: Secondary | ICD-10-CM | POA: Insufficient documentation

## 2017-06-28 MED ORDER — HYDROCHLOROTHIAZIDE 12.5 MG PO TABS: 25.0000 mg | ORAL_TABLET | Freq: Every day | ORAL | 2 refills | Status: DC

## 2017-06-28 MED ORDER — CARBAMIDE PEROXIDE 6.5 % OT SOLN: 5.0000 [drp] | Freq: Two times a day (BID) | OTIC | 0 refills | Status: DC | PRN

## 2017-06-28 MED ORDER — DOXYCYCLINE MONOHYDRATE 100 MG PO TABS: 100.0000 mg | ORAL_TABLET | Freq: Two times a day (BID) | ORAL | 0 refills | Status: DC

## 2017-06-28 MED ORDER — AMLODIPINE BESYLATE 10 MG PO TABS: 10.0000 mg | ORAL_TABLET | Freq: Every day | ORAL | 2 refills | Status: DC

## 2017-06-28 MED ORDER — LOSARTAN POTASSIUM 50 MG PO TABS: 50.0000 mg | ORAL_TABLET | Freq: Every day | ORAL | 2 refills | Status: DC

## 2017-06-28 MED FILL — HYDROCHLOROTHIAZIDE 12.5 MG: 12.5 | 30 days supply | Qty: 60 | Fill #0

## 2017-06-28 NOTE — Progress Notes (Signed)
Patient is here for both ears feel clogged up   Patient also stated that he has some knots on his head

## 2017-06-28 NOTE — Progress Notes (Signed)
Subjective:  Patient ID: James GaussRonald W Rupard, male    DOB: 11/25/1951  Age: 66 y.o. MRN: [id number]  CC: Ear Fullness   [name] presents for bilateral ear complaint.  Symptoms include plugged sensation in both ears. Symptoms began a few weeks ago and are gradually worsening since that time. Patient denies nasal congestion, rhinorrhea, sneezing, sore throat and cough, or fevers. He also presents for skin complaint. Skin pustules located on the scalp. Symptoms have been present several years and is reoccurring. He reports reoccurrence 1 week ago. Associated symptoms include drainage. He is unsure of drainage appearance but reports it is not malodorous. He reports treatment with antibiotics in the past. History of hypertension. He is not exercising and is not adherent to low salt diet.  He doe snot check BP at home. Cardiac symptoms none. Patient denies chest pain, chest pressure/discomfort, claudication, dyspnea, near-syncope, palpitations and syncope.  Cardiovascular risk factors: advanced age (older than 6055 for men, 365 for women), hypertension, male gender, sedentary lifestyle and smoking/ tobacco exposure. Use of agents associated with hypertension: none. History of target organ damage: stroke.  Outpatient Medications Prior to Visit  Medication Sig Dispense Refill  . nicotine (NICODERM CQ - DOSED IN MG/24 HOURS) 21 mg/24hr patch Place 1 patch (21 mg total) onto the skin daily. 28 patch 1  . sildenafil (VIAGRA) 100 MG tablet Take 0.5-1 tablets (50-100 mg total) by mouth daily as needed for erectile dysfunction. 30 tablet 3  . amLODipine (NORVASC) 10 MG tablet Take 1 tablet (10 mg total) by mouth daily. 30 tablet 2  . docusate sodium (COLACE) 100 MG capsule Take 1 capsule (100 mg total) by mouth at bedtime as needed for mild constipation. (Patient not taking: Reported on 02/18/2017) 60 capsule 0  . losartan (COZAAR) 50 MG tablet Take 1 tablet (50 mg total) by mouth daily. 30 tablet 2  .  Multiple Vitamin (MULTIVITAMIN WITH MINERALS) TABS tablet Take 1 tablet by mouth daily. 30 tablet 3  . oxyCODONE (OXY IR/ROXICODONE) 5 MG immediate release tablet Take 1-2 tablets (5-10 mg total) by mouth every 6 (six) hours as needed for moderate pain. (Patient not taking: Reported on 02/18/2017) 30 tablet 0  . polyethylene glycol powder (GLYCOLAX/MIRALAX) powder Take 17 g by mouth daily. (Patient not taking: Reported on 02/18/2017) 850 g 3  . psyllium (METAMUCIL SMOOTH TEXTURE) 28 % packet Take 1 packet by mouth 2 (two) times daily as needed.     No facility-administered medications prior to visit.     ROS Review of Systems  Constitutional: Negative.   HENT:       Ear fullness  Eyes: Negative.   Respiratory: Negative.   Cardiovascular: Negative.   Gastrointestinal: Negative.   Skin: Positive for rash.  Psychiatric/Behavioral: Negative.      Objective:  BP 135/66 (BP Location: Left Arm, Patient Position: Sitting, Cuff Size: Normal)   Pulse 68   Temp 97.8 F (36.6 C) (Oral)   Resp 18   Ht 5\' 10"  (1.778 m)   Wt 187 lb 3.2 oz (84.9 kg)   SpO2 99%   BMI 26.86 kg/m   BP/Weight 06/28/2017 04/05/2017 03/05/2017  Systolic BP 135 138 128  Diastolic BP 66 70 66  Wt. (Lbs) 187.2 183 -  BMI 26.86 26.26 -     Physical Exam  Constitutional: He appears well-developed and well-nourished.  HENT:  Head: Normocephalic and atraumatic.  Right Ear: There is drainage (cerumen impaction).  Left Ear: There is drainage (  cerumen impaction).  Nose: Nose normal.  Mouth/Throat: Oropharynx is clear and moist.  Eyes: Pupils are equal, round, and reactive to light. Conjunctivae are normal.  Neck: No JVD present.  Cardiovascular: Normal rate, regular rhythm, normal heart sounds and intact distal pulses.   Pulmonary/Chest: Effort normal and breath sounds normal.  Abdominal: Soft. Bowel sounds are normal.  Skin: Skin is warm and dry. Rash noted. Rash is pustular (several round pustles scattered  throughout scalp. No drainage present. ).  Psychiatric: He has a normal mood and affect.  Nursing note and vitals reviewed.   Assessment & Plan:   Problem List Items Addressed This Visit      Cardiovascular and Mediastinum   Hypertension   Schedule BP recheck in 2 weeks with clinic RN   If BP is greater than 90/60 (MAP 65 or greater) but not less than 130/80 may increase dose of HTCZ to 25 mg QD and recheck in another 2 weeks with clinic RN.   Recommend lab visit for cholesterol check at RN visit.   Follow up with PCP in 3 months.   Relevant Medications   hydrochlorothiazide (HYDRODIURIL) 12.5 MG tablet   amLODipine (NORVASC) 10 MG tablet   losartan (COZAAR) 50 MG tablet    Other Visit Diagnoses    Bilateral impacted cerumen    -  Primary   Relevant Medications   carbamide peroxide (DEBROX) 6.5 % OTIC solution   Dissecting cellulitis of scalp       Relevant Medications   doxycycline (ADOXA) 100 MG tablet   Other Relevant Orders   Ambulatory referral to Dermatology      Meds ordered this encounter  Medications  . hydrochlorothiazide (HYDRODIURIL) 12.5 MG tablet    Sig: Take 2 tablets (25 mg total) by mouth daily.    Dispense:  30 tablet    Refill:  2    Order Specific Question:   Supervising Provider    Answer:   Quentin AngstJEGEDE, OLUGBEMIGA E [id number]]  . amLODipine (NORVASC) 10 MG tablet    Sig: Take 1 tablet (10 mg total) by mouth daily.    Dispense:  30 tablet    Refill:  2    Order Specific Question:   Supervising Provider    Answer:   Quentin AngstJEGEDE, OLUGBEMIGA E [id number]]  . losartan (COZAAR) 50 MG tablet    Sig: Take 1 tablet (50 mg total) by mouth daily.    Dispense:  30 tablet    Refill:  2    Order Specific Question:   Supervising Provider    Answer:   Quentin AngstJEGEDE, OLUGBEMIGA E [id number]]  . carbamide peroxide (DEBROX) 6.5 % OTIC solution    Sig: Place 5 drops into both ears 2 (two) times daily as needed. FOR 5 DAYS.    Dispense:  15 mL    Refill:  0    Order Specific  Question:   Supervising Provider    Answer:   Quentin AngstJEGEDE, OLUGBEMIGA E [id number]]  . doxycycline (ADOXA) 100 MG tablet    Sig: Take 1 tablet (100 mg total) by mouth 2 (two) times daily.    Dispense:  30 tablet    Refill:  0    Order Specific Question:   Supervising Provider    Answer:   Quentin AngstJEGEDE, OLUGBEMIGA E [id number]]    Follow-up: Return in about 2 weeks (around 07/12/2017) for BP check with Travia.   Lizbeth BarkMandesia R Jere Bostrom FNP

## 2017-06-28 NOTE — Patient Instructions (Signed)

## 2017-07-04 MED FILL — DOXYCYCLINE MONO 100 MG CAP: 100 | 15 days supply | Qty: 30 | Fill #0

## 2017-07-19 ENCOUNTER — Encounter: Payer: Self-pay | Admitting: Family Medicine

## 2017-07-25 MED FILL — HYDROCHLOROTHIAZIDE 12.5 MG: 12.5 | 15 days supply | Qty: 30 | Fill #1

## 2017-07-25 MED FILL — AMLODIPINE BESYLATE 10 MG T: 10 | 30 days supply | Qty: 30 | Fill #1

## 2017-07-29 MED FILL — $VIAGRA 100 MG TABLET: 100 | 30 days supply | Qty: 10 | Fill #1

## 2017-09-02 MED FILL — $VIAGRA 100 MG TABLET: 100 | 30 days supply | Qty: 10 | Fill #2

## 2017-09-02 MED FILL — AMLODIPINE BESYLATE 10 MG T: 10 | 30 days supply | Qty: 30 | Fill #2

## 2017-09-03 DIAGNOSIS — Z23 Encounter for immunization: Secondary | ICD-10-CM | POA: Diagnosis not present

## 2017-10-18 ENCOUNTER — Encounter: Payer: Self-pay | Admitting: Family Medicine

## 2017-10-18 ENCOUNTER — Other Ambulatory Visit: Payer: Self-pay | Admitting: Family Medicine

## 2017-10-18 ENCOUNTER — Ambulatory Visit: Payer: Medicare Other | Attending: Family Medicine | Admitting: Family Medicine

## 2017-10-18 VITALS — BP 152/70 | HR 76 | Temp 98.5°F | Resp 18 | Ht 70.0 in | Wt 193.4 lb

## 2017-10-18 DIAGNOSIS — L03811 Cellulitis of head [any part, except face]: Secondary | ICD-10-CM | POA: Insufficient documentation

## 2017-10-18 DIAGNOSIS — L02224 Furuncle of groin: Secondary | ICD-10-CM

## 2017-10-18 DIAGNOSIS — I10 Essential (primary) hypertension: Secondary | ICD-10-CM | POA: Diagnosis not present

## 2017-10-18 DIAGNOSIS — Z79899 Other long term (current) drug therapy: Secondary | ICD-10-CM | POA: Diagnosis not present

## 2017-10-18 DIAGNOSIS — Z8673 Personal history of transient ischemic attack (TIA), and cerebral infarction without residual deficits: Secondary | ICD-10-CM | POA: Diagnosis not present

## 2017-10-18 DIAGNOSIS — L663 Perifolliculitis capitis abscedens: Secondary | ICD-10-CM | POA: Diagnosis not present

## 2017-10-18 DIAGNOSIS — N529 Male erectile dysfunction, unspecified: Secondary | ICD-10-CM | POA: Diagnosis not present

## 2017-10-18 MED ORDER — HYDROCHLOROTHIAZIDE 25 MG PO TABS: 25.0000 mg | ORAL_TABLET | Freq: Every day | ORAL | 6 refills | Status: DC

## 2017-10-18 MED ORDER — AMLODIPINE BESYLATE 10 MG PO TABS: 10.0000 mg | ORAL_TABLET | Freq: Every day | ORAL | 6 refills | Status: DC

## 2017-10-18 MED ORDER — SULFAMETHOXAZOLE-TRIMETHOPRIM 800-160 MG PO TABS: 1.0000 | ORAL_TABLET | Freq: Two times a day (BID) | ORAL | 0 refills | Status: DC

## 2017-10-18 MED ORDER — LOSARTAN POTASSIUM 50 MG PO TABS: 50.0000 mg | ORAL_TABLET | Freq: Every day | ORAL | 6 refills | Status: DC

## 2017-10-18 MED ORDER — SILDENAFIL CITRATE 100 MG PO TABS: 100.0000 mg | ORAL_TABLET | Freq: Every day | ORAL | 3 refills | Status: DC | PRN

## 2017-10-18 MED ORDER — BACITRACIN 500 UNIT/GM EX OINT: 1.0000 "application " | TOPICAL_OINTMENT | Freq: Two times a day (BID) | CUTANEOUS | 0 refills | Status: DC | PRN

## 2017-10-18 MED ORDER — NEOSPORIN ORIGINAL 3.5-400-5000 EX OINT: TOPICAL_OINTMENT | CUTANEOUS | 0 refills | Status: DC

## 2017-10-18 MED FILL — SULFAMETHOXAZOLE-TMP DS TAB: 800-160 | 7 days supply | Qty: 14 | Fill #0

## 2017-10-18 MED FILL — HYDROCHLOROTHIAZIDE 25 MG T: 25 | 30 days supply | Qty: 30 | Fill #0

## 2017-10-18 MED FILL — LOSARTAN POTASSIUM 50 MG TA: 50 | 30 days supply | Qty: 30 | Fill #0

## 2017-10-18 MED FILL — AMLODIPINE BESYLATE 10 MG T: 10 | 30 days supply | Qty: 30 | Fill #0

## 2017-10-18 NOTE — Progress Notes (Signed)
Patient is here for f/up HTN   Patient complains about bumps on back of head & in between legs

## 2017-10-18 NOTE — Progress Notes (Signed)
Subjective:  Patient ID: James Riley, male    DOB: 07/01/1951  Age: 66 y.o. MRN: [id number]  CC: Hypertension   HPI James GaussRonald W Riley presents for HTN, medication refills,  He also presents for skin complaint. Skin pustules located on the scalp. Symptoms have been present several years and is reoccurring. He reports treatment with antibiotics in the past. He also complains of boil to the left groin area. Onset 1 month ago. Associated symptoms include pus-like drainage, itching, tenderness.History of hypertension. He is not exercising and is not adherent to low salt diet.  He does not check BP at home. He is not taking his HTCZ. Cardiac symptoms none. Patient denies chest pain, chest pressure/discomfort, claudication, dyspnea, near-syncope, palpitations and syncope.  Cardiovascular risk factors: advanced age (older than 8055 for men, 1865 for women), hypertension, male gender, sedentary lifestyle and smoking/ tobacco exposure. Use of agents associated with hypertension: none. History of target organ damage: stroke.    Outpatient Medications Prior to Visit  Medication Sig Dispense Refill  . carbamide peroxide (DEBROX) 6.5 % OTIC solution Place 5 drops into both ears 2 (two) times daily as needed. FOR 5 DAYS. 15 mL 0  . doxycycline (ADOXA) 100 MG tablet Take 1 tablet (100 mg total) by mouth 2 (two) times daily. 30 tablet 0  . nicotine (NICODERM CQ - DOSED IN MG/24 HOURS) 21 mg/24hr patch Place 1 patch (21 mg total) onto the skin daily. 28 patch 1  . amLODipine (NORVASC) 10 MG tablet Take 1 tablet (10 mg total) by mouth daily. 30 tablet 2  . hydrochlorothiazide (HYDRODIURIL) 12.5 MG tablet Take 2 tablets (25 mg total) by mouth daily. 30 tablet 2  . losartan (COZAAR) 50 MG tablet Take 1 tablet (50 mg total) by mouth daily. 30 tablet 2  . sildenafil (VIAGRA) 100 MG tablet Take 0.5-1 tablets (50-100 mg total) by mouth daily as needed for erectile dysfunction. 30 tablet 3   No facility-administered  medications prior to visit.     ROS Review of Systems  Constitutional: Negative.   Eyes: Negative.   Respiratory: Negative.   Cardiovascular: Negative.   Gastrointestinal: Negative.   Skin: Positive for rash.  Psychiatric/Behavioral: Negative.      Objective:  BP (!) 152/70 (BP Location: Left Arm, Patient Position: Sitting, Cuff Size: Normal)   Pulse 76   Temp 98.5 F (36.9 C) (Oral)   Resp 18   Ht 5\' 10"  (1.778 m)   Wt 193 lb 6.4 oz (87.7 kg)   SpO2 99%   BMI 27.75 kg/m   BP/Weight 10/18/2017 06/28/2017 04/05/2017  Systolic BP 152 135 138  Diastolic BP 70 66 70  Wt. (Lbs) 193.4 187.2 183  BMI 27.75 26.86 26.26     Physical Exam  Constitutional: He appears well-developed and well-nourished.  HENT:  Head: Normocephalic and atraumatic.  Nose: Nose normal.  Mouth/Throat: Oropharynx is clear and moist.  Eyes: Conjunctivae are normal. Pupils are equal, round, and reactive to light.  Neck: No JVD present.  Cardiovascular: Normal rate, regular rhythm, normal heart sounds and intact distal pulses.  Pulmonary/Chest: Effort normal and breath sounds normal.  Abdominal: Soft. Bowel sounds are normal.  Skin: Skin is warm and dry. Rash noted. Rash is pustular (several round pustles scattered throughout scalp. Scant dried exduate present to left-sided scalp.).  Left groin: excoriated raised skin mass, no drainage present.  Psychiatric: He has a normal mood and affect.  Nursing note and vitals reviewed.   Assessment & Plan:  1. Furuncle of groin Start taking HTCZ. Schedule BP recheck in 2 weeks with nurse. Follow up with PCP in 6 months. - Ambulatory referral to Dermatology - sulfamethoxazole-trimethoprim (BACTRIM DS,SEPTRA DS) 800-160 MG tablet; Take 1 tablet 2 (two) times daily by mouth.  Dispense: 14 tablet; Refill: 0 - Neomycin-Bacitracin-Polymyxin (NEOSPORIN ORIGINAL) [phone] OINT; Use as directed; Refill: 0  2. Essential hypertension  - amLODipine (NORVASC) 10  MG tablet; Take 1 tablet (10 mg total) daily by mouth.  Dispense: 30 tablet; Refill: 6 - hydrochlorothiazide (HYDRODIURIL) 25 MG tablet; Take 1 tablet (25 mg total) daily by mouth.  Dispense: 30 tablet; Refill: 6 - losartan (COZAAR) 50 MG tablet; Take 1 tablet (50 mg total) daily by mouth.  Dispense: 30 tablet; Refill: 6  3. Erectile dysfunction, unspecified erectile dysfunction type  - sildenafil (VIAGRA) 100 MG tablet; Take 1 tablet (100 mg total) daily as needed by mouth for erectile dysfunction.  Dispense: 30 tablet; Refill: 3  4. Dissecting cellulitis of scalp  - Ambulatory referral to Dermatology - sulfamethoxazole-trimethoprim (BACTRIM DS,SEPTRA DS) 800-160 MG tablet; Take 1 tablet 2 (two) times daily by mouth.  Dispense: 14 tablet; Refill: 0 - Neomycin-Bacitracin-Polymyxin (NEOSPORIN ORIGINAL) [phone] OINT; Use as directed; Refill: 0     Follow-up: Return in about 1 week (around 10/25/2017) for BP check with James Riley.   Lizbeth BarkMandesia R Tyesha Joffe FNP

## 2017-10-18 NOTE — Patient Instructions (Signed)
Skin Abscess A skin abscess is an infected area on or under your skin that contains pus and other material. An abscess can happen almost anywhere on your body. Some abscesses break open (rupture) on their own. Most continue to get worse unless they are treated. The infection can spread deeper into the body and into your blood, which can make you feel sick. Treatment usually involves draining the abscess. Follow these instructions at home: Abscess Care  If you have an abscess that has not drained, place a warm, clean, wet washcloth over the abscess several times a day. Do this as told by your doctor.  Follow instructions from your doctor about how to take care of your abscess. Make sure you: ? Cover the abscess with a bandage (dressing). ? Change your bandage or gauze as told by your doctor. ? Wash your hands with soap and water before you change the bandage or gauze. If you cannot use soap and water, use hand sanitizer.  Check your abscess every day for signs that the infection is getting worse. Check for: ? More redness, swelling, or pain. ? More fluid or blood. ? Warmth. ? More pus or a bad smell. Medicines   Take over-the-counter and prescription medicines only as told by your doctor.  If you were prescribed an antibiotic medicine, take it as told by your doctor. Do not stop taking the antibiotic even if you start to feel better. General instructions  To avoid spreading the infection: ? Do not share personal care items, towels, or hot tubs with others. ? Avoid making skin-to-skin contact with other people.  Keep all follow-up visits as told by your doctor. This is important. Contact a doctor if:  You have more redness, swelling, or pain around your abscess.  You have more fluid or blood coming from your abscess.  Your abscess feels warm when you touch it.  You have more pus or a bad smell coming from your abscess.  You have a fever.  Your muscles ache.  You have  chills.  You feel sick. Get help right away if:  You have very bad (severe) pain.  You see red streaks on your skin spreading away from the abscess. This information is not intended to replace advice given to you by your health care provider. Make sure you discuss any questions you have with your health care provider. Document Released: 05/07/2008 Document Revised: 07/15/2016 Document Reviewed: 09/28/2015 Elsevier Interactive Patient Education  2018 Elsevier Inc[employer]  

## 2017-10-22 MED FILL — !VIAGRA 100MG TABLET: 100 | 30 days supply | Qty: 10 | Fill #0

## 2017-11-07 ENCOUNTER — Other Ambulatory Visit: Payer: Self-pay | Admitting: *Deleted

## 2017-11-07 DIAGNOSIS — N529 Male erectile dysfunction, unspecified: Secondary | ICD-10-CM

## 2017-11-07 MED ORDER — SILDENAFIL CITRATE 100 MG PO TABS: 100.0000 mg | ORAL_TABLET | Freq: Every day | ORAL | 3 refills | Status: DC | PRN

## 2017-11-07 NOTE — Telephone Encounter (Signed)
PRINTED FOR PASS PROGRAM 

## 2017-11-18 MED FILL — AMLODIPINE BESYLATE 10 MG T: 10 | 30 days supply | Qty: 30 | Fill #1

## 2017-11-18 MED FILL — LOSARTAN POTASSIUM 50 MG TA: 50 | 30 days supply | Qty: 30 | Fill #1

## 2017-11-18 MED FILL — HYDROCHLOROTHIAZIDE 25 MG T: 25 | 30 days supply | Qty: 30 | Fill #1

## 2017-11-18 MED FILL — !VIAGRA 100MG TABLET: 100 | 30 days supply | Qty: 10 | Fill #1

## 2017-12-25 DIAGNOSIS — Z5181 Encounter for therapeutic drug level monitoring: Secondary | ICD-10-CM | POA: Diagnosis not present

## 2017-12-30 DIAGNOSIS — Z79899 Other long term (current) drug therapy: Secondary | ICD-10-CM | POA: Diagnosis not present

## 2017-12-30 DIAGNOSIS — Z5181 Encounter for therapeutic drug level monitoring: Secondary | ICD-10-CM | POA: Diagnosis not present

## 2018-01-01 DIAGNOSIS — Z79899 Other long term (current) drug therapy: Secondary | ICD-10-CM | POA: Diagnosis not present

## 2018-01-01 DIAGNOSIS — Z5181 Encounter for therapeutic drug level monitoring: Secondary | ICD-10-CM | POA: Diagnosis not present

## 2018-01-06 ENCOUNTER — Encounter: Payer: Self-pay | Admitting: Family Medicine

## 2018-01-06 ENCOUNTER — Ambulatory Visit: Payer: Medicare Other | Attending: Family Medicine | Admitting: Family Medicine

## 2018-01-06 VITALS — BP 125/66 | HR 71 | Temp 98.1°F | Resp 16 | Ht 66.0 in | Wt 193.2 lb

## 2018-01-06 DIAGNOSIS — Z1211 Encounter for screening for malignant neoplasm of colon: Secondary | ICD-10-CM | POA: Diagnosis not present

## 2018-01-06 DIAGNOSIS — I10 Essential (primary) hypertension: Secondary | ICD-10-CM | POA: Diagnosis not present

## 2018-01-06 DIAGNOSIS — L989 Disorder of the skin and subcutaneous tissue, unspecified: Secondary | ICD-10-CM | POA: Diagnosis not present

## 2018-01-06 DIAGNOSIS — Z13220 Encounter for screening for lipoid disorders: Secondary | ICD-10-CM

## 2018-01-06 DIAGNOSIS — Z79899 Other long term (current) drug therapy: Secondary | ICD-10-CM | POA: Insufficient documentation

## 2018-01-06 DIAGNOSIS — E785 Hyperlipidemia, unspecified: Secondary | ICD-10-CM | POA: Diagnosis not present

## 2018-01-06 DIAGNOSIS — Z5181 Encounter for therapeutic drug level monitoring: Secondary | ICD-10-CM | POA: Diagnosis not present

## 2018-01-06 DIAGNOSIS — N529 Male erectile dysfunction, unspecified: Secondary | ICD-10-CM

## 2018-01-06 MED ORDER — HYDROCHLOROTHIAZIDE 25 MG PO TABS: 25.0000 mg | ORAL_TABLET | Freq: Every day | ORAL | 6 refills | Status: DC

## 2018-01-06 MED ORDER — SILDENAFIL CITRATE 100 MG PO TABS: 100.0000 mg | ORAL_TABLET | Freq: Every day | ORAL | 3 refills | Status: DC | PRN

## 2018-01-06 MED ORDER — AMLODIPINE BESYLATE 10 MG PO TABS: 10.0000 mg | ORAL_TABLET | Freq: Every day | ORAL | 6 refills | Status: DC

## 2018-01-06 MED ORDER — LOSARTAN POTASSIUM 50 MG PO TABS: 50.0000 mg | ORAL_TABLET | Freq: Every day | ORAL | 6 refills | Status: DC

## 2018-01-06 MED FILL — LOSARTAN POTASSIUM 50 MG TA: 50 | 30 days supply | Qty: 30 | Fill #0

## 2018-01-06 MED FILL — HYDROCHLOROTHIAZIDE 25 MG T: 25 | 30 days supply | Qty: 30 | Fill #0

## 2018-01-06 MED FILL — $VIAGRA 100 MG TABLET: 100 | 30 days supply | Qty: 10 | Fill #0

## 2018-01-06 MED FILL — AMLODIPINE BESYLATE 10 MG T: 10 | 30 days supply | Qty: 30 | Fill #0

## 2018-01-06 NOTE — Patient Instructions (Signed)
Managing Your Hypertension Hypertension is commonly called high blood pressure. This is when the force of your blood pressing against the walls of your arteries is too strong. Arteries are blood vessels that carry blood from your heart throughout your body. Hypertension forces the heart to work harder to pump blood, and may cause the arteries to become narrow or stiff. Having untreated or uncontrolled hypertension can cause heart attack, stroke, kidney disease, and other problems. What are blood pressure readings? A blood pressure reading consists of a higher number over a lower number. Ideally, your blood pressure should be below 120/80. The first ("top") number is called the systolic pressure. It is a measure of the pressure in your arteries as your heart beats. The second ("bottom") number is called the diastolic pressure. It is a measure of the pressure in your arteries as the heart relaxes. What does my blood pressure reading mean? Blood pressure is classified into four stages. Based on your blood pressure reading, your health care provider may use the following stages to determine what type of treatment you need, if any. Systolic pressure and diastolic pressure are measured in a unit called mm Hg. Normal  Systolic pressure: below 120.  Diastolic pressure: below 80. Elevated  Systolic pressure: 120-129.  Diastolic pressure: below 80. Hypertension stage 1  Systolic pressure: 130-139.  Diastolic pressure: 80-89. Hypertension stage 2  Systolic pressure: 140 or above.  Diastolic pressure: 90 or above. What health risks are associated with hypertension? Managing your hypertension is an important responsibility. Uncontrolled hypertension can lead to:  A heart attack.  A stroke.  A weakened blood vessel (aneurysm).  Heart failure.  Kidney damage.  Eye damage.  Metabolic syndrome.  Memory and concentration problems.  What changes can I make to manage my  hypertension? Hypertension can be managed by making lifestyle changes and possibly by taking medicines. Your health care provider will help you make a plan to bring your blood pressure within a normal range. Eating and drinking  Eat a diet that is high in fiber and potassium, and low in salt (sodium), added sugar, and fat. An example eating plan is called the DASH (Dietary Approaches to Stop Hypertension) diet. To eat this way: ? Eat plenty of fresh fruits and vegetables. Try to fill half of your plate at each meal with fruits and vegetables. ? Eat whole grains, such as whole wheat pasta, brown rice, or whole grain bread. Fill about one quarter of your plate with whole grains. ? Eat low-fat diary products. ? Avoid fatty cuts of meat, processed or cured meats, and poultry with skin. Fill about one quarter of your plate with lean proteins such as fish, chicken without skin, beans, eggs, and tofu. ? Avoid premade and processed foods. These tend to be higher in sodium, added sugar, and fat.  Reduce your daily sodium intake. Most people with hypertension should eat less than 1,500 mg of sodium a day.  Limit alcohol intake to no more than 1 drink a day for nonpregnant women and 2 drinks a day for men. One drink equals 12 oz of beer, 5 oz of wine, or 1 oz of hard liquor. Lifestyle  Work with your health care provider to maintain a healthy body weight, or to lose weight. Ask what an ideal weight is for you.  Get at least 30 minutes of exercise that causes your heart to beat faster (aerobic exercise) most days of the week. Activities may include walking, swimming, or biking.  Include exercise   to strengthen your muscles (resistance exercise), such as weight lifting, as part of your weekly exercise routine. Try to do these types of exercises for 30 minutes at least 3 days a week.  Do not use any products that contain nicotine or tobacco, such as cigarettes and e-cigarettes. If you need help quitting, ask  your health care provider.  Control any long-term (chronic) conditions you have, such as high cholesterol or diabetes. Monitoring  Monitor your blood pressure at home as told by your health care provider. Your personal target blood pressure may vary depending on your medical conditions, your age, and other factors.  Have your blood pressure checked regularly, as often as told by your health care provider. Working with your health care provider  Review all the medicines you take with your health care provider because there may be side effects or interactions.  Talk with your health care provider about your diet, exercise habits, and other lifestyle factors that may be contributing to hypertension.  Visit your health care provider regularly. Your health care provider can help you create and adjust your plan for managing hypertension. Will I need medicine to control my blood pressure? Your health care provider may prescribe medicine if lifestyle changes are not enough to get your blood pressure under control, and if:  Your systolic blood pressure is 130 or higher.  Your diastolic blood pressure is 80 or higher.  Take medicines only as told by your health care provider. Follow the directions carefully. Blood pressure medicines must be taken as prescribed. The medicine does not work as well when you skip doses. Skipping doses also puts you at risk for problems. Contact a health care provider if:  You think you are having a reaction to medicines you have taken.  You have repeated (recurrent) headaches.  You feel dizzy.  You have swelling in your ankles.  You have trouble with your vision. Get help right away if:  You develop a severe headache or confusion.  You have unusual weakness or numbness, or you feel faint.  You have severe pain in your chest or abdomen.  You vomit repeatedly.  You have trouble breathing. Summary  Hypertension is when the force of blood pumping through  your arteries is too strong. If this condition is not controlled, it may put you at risk for serious complications.  Your personal target blood pressure may vary depending on your medical conditions, your age, and other factors. For most people, a normal blood pressure is less than 120/80.  Hypertension is managed by lifestyle changes, medicines, or both. Lifestyle changes include weight loss, eating a healthy, low-sodium diet, exercising more, and limiting alcohol. This information is not intended to replace advice given to you by your health care provider. Make sure you discuss any questions you have with your health care provider. Document Released: 08/13/2012 Document Revised: 10/17/2016 Document Reviewed: 10/17/2016 Elsevier Interactive Patient Education  2018 Elsevier Inc.  

## 2018-01-06 NOTE — Progress Notes (Signed)
Subjective:  Patient ID: James Riley, male    DOB: Jul 29, 1951  Age: 67 y.o. MRN: [id number]  CC: Hypertension and Medication Refill   [name] presents for   Hypertension   Disease Monitoring  Blood pressure range: No   Chest pain: no   Dyspnea: no   Claudication: no   Medication compliance: yes Medication Side Effects  Lightheadedness: no   Urinary frequency: no   Edema: no   Impotence: no . History of ED symptom controlled with Viagra.   Preventitive Healthcare:  Exercise: yes walks 2 miles per day   Diet Pattern: regular diet   Salt Restriction: no  Skin complaint Skin nodules located on the scalp. Symptoms have been present several years and is reoccurring. He reports treatment with antibiotics in the past.   Outpatient Medications Prior to Visit  Medication Sig Dispense Refill  . amLODipine (NORVASC) 10 MG tablet Take 1 tablet (10 mg total) daily by mouth. 30 tablet 6  . hydrochlorothiazide (HYDRODIURIL) 25 MG tablet Take 1 tablet (25 mg total) daily by mouth. 30 tablet 6  . losartan (COZAAR) 50 MG tablet Take 1 tablet (50 mg total) daily by mouth. 30 tablet 6  . sildenafil (VIAGRA) 100 MG tablet Take 1 tablet (100 mg total) by mouth daily as needed for erectile dysfunction. 30 tablet 3  . carbamide peroxide (DEBROX) 6.5 % OTIC solution Place 5 drops into both ears 2 (two) times daily as needed. FOR 5 DAYS. (Patient not taking: Reported on 01/06/2018) 15 mL 0  . doxycycline (ADOXA) 100 MG tablet Take 1 tablet (100 mg total) by mouth 2 (two) times daily. (Patient not taking: Reported on 01/06/2018) 30 tablet 0  . Neomycin-Bacitracin-Polymyxin (NEOSPORIN ORIGINAL) [phone] OINT Use as directed (Patient not taking: Reported on 01/06/2018)  0  . nicotine (NICODERM CQ - DOSED IN MG/24 HOURS) 21 mg/24hr patch Place 1 patch (21 mg total) onto the skin daily. (Patient not taking: Reported on 01/06/2018) 28 patch 1  . sulfamethoxazole-trimethoprim (BACTRIM DS,SEPTRA  DS) 800-160 MG tablet Take 1 tablet 2 (two) times daily by mouth. (Patient not taking: Reported on 01/06/2018) 14 tablet 0   No facility-administered medications prior to visit.     ROS Review of Systems  Constitutional: Negative.   Respiratory: Negative.   Cardiovascular: Negative.   Gastrointestinal: Negative.   Skin:       Scalp lesions    Objective:  BP 125/66 (BP Location: Left Arm, Patient Position: Sitting, Cuff Size: Large)   Pulse 71   Temp 98.1 F (36.7 C) (Oral)   Resp 16   Ht 5\' 6"  (1.676 m)   Wt 193 lb 3.2 oz (87.6 kg)   SpO2 97%   BMI 31.18 kg/m   BP/Weight 01/06/2018 10/18/2017 06/28/2017  Systolic BP 125 152 135  Diastolic BP 66 70 66  Wt. (Lbs) 193.2 193.4 187.2  BMI 31.18 27.75 26.86     Physical Exam  Eyes: Conjunctivae are normal. Pupils are equal, round, and reactive to light.  Neck: No JVD present.  Cardiovascular: Normal rate, regular rhythm, normal heart sounds and intact distal pulses.  Pulmonary/Chest: Effort normal and breath sounds normal.  Abdominal: Soft. Bowel sounds are normal.  Skin: Skin is warm and dry. Lesion (several round nodules scattered throughout scalp, no exudate present.) noted.  Nursing note and vitals reviewed.  Assessment & Plan:   1. Essential hypertension  - amLODipine (NORVASC) 10 MG tablet; Take 1 tablet (10 mg total) by  mouth daily.  Dispense: 30 tablet; Refill: 6 - hydrochlorothiazide (HYDRODIURIL) 25 MG tablet; Take 1 tablet (25 mg total) by mouth daily.  Dispense: 30 tablet; Refill: 6 - losartan (COZAAR) 50 MG tablet; Take 1 tablet (50 mg total) by mouth daily.  Dispense: 30 tablet; Refill: 6  2. Screening for colon cancer  - Ambulatory referral to Gastroenterology  3. Erectile dysfunction, unspecified erectile dysfunction type  - sildenafil (VIAGRA) 100 MG tablet; Take 1 tablet (100 mg total) by mouth daily as needed for erectile dysfunction.  Dispense: 30 tablet; Refill: 3  4. Scalp lesion  - Ambulatory  referral to Dermatology  5. Screening cholesterol level  - Lipid Panel      Follow-up: Return in about 3 months (around 04/05/2018) for HTN.   Lizbeth BarkMandesia R Rajanee Schuelke FNP

## 2018-01-06 NOTE — Progress Notes (Signed)
Patient is here for hypertension.   Patient request medication refills.

## 2018-01-07 ENCOUNTER — Other Ambulatory Visit: Payer: Self-pay | Admitting: Family Medicine

## 2018-01-07 ENCOUNTER — Encounter: Payer: Self-pay | Admitting: Family Medicine

## 2018-01-07 ENCOUNTER — Telehealth: Payer: Self-pay | Admitting: *Deleted

## 2018-01-07 DIAGNOSIS — E782 Mixed hyperlipidemia: Secondary | ICD-10-CM

## 2018-01-07 LAB — LIPID PANEL
CHOL/HDL RATIO: 5.2 ratio — AB (ref 0.0–5.0)
CHOLESTEROL TOTAL: 186 mg/dL (ref 100–199)
HDL: 36 mg/dL — AB (ref >39)
LDL CALC: 108 mg/dL — AB (ref 0–99)
TRIGLYCERIDES: 209 mg/dL — AB (ref 0–149)
VLDL CHOLESTEROL CAL: 42 mg/dL — AB (ref 5–40)

## 2018-01-07 MED ORDER — ATORVASTATIN CALCIUM 20 MG PO TABS: 20.0000 mg | ORAL_TABLET | Freq: Every day | ORAL | 2 refills | Status: DC

## 2018-01-07 MED FILL — ATORVASTATIN 20 MG TABLET: 20 | 30 days supply | Qty: 30 | Fill #0

## 2018-01-07 NOTE — Telephone Encounter (Signed)
Pt notified   Lipid levels were elevated. This can increase your risk of heart disease overtime. You will be prescribed atorvastatin. Start eating a diet low in saturated fat. Limit your intake of fried foods, red meats, and whole milk. Increase physical activity. Recommend follow up in 3 months. Pt verbalized understanding

## 2018-01-08 ENCOUNTER — Telehealth: Payer: Self-pay | Admitting: *Deleted

## 2018-01-08 DIAGNOSIS — Z79899 Other long term (current) drug therapy: Secondary | ICD-10-CM | POA: Diagnosis not present

## 2018-01-08 DIAGNOSIS — Z5181 Encounter for therapeutic drug level monitoring: Secondary | ICD-10-CM | POA: Diagnosis not present

## 2018-01-08 NOTE — Telephone Encounter (Signed)
Lizbeth BarkHairston, Mandesia R, FNP  Dyann KiefGiraldez, Stella M, RMA  Cc: Guy FrancoBenjamin, Jolyssa Oplinger, RN        Lipid levels were elevated. This can increase your risk of heart disease overtime. You will be prescribed atorvastatin. Start eating a diet low in saturated fat. Limit your intake of fried foods, red meats, and whole milk. Increase physical activity.  Recommend follow up in 3 months.    Unable to reach patient. Left message on a voicemail for patient to call back.

## 2018-01-13 DIAGNOSIS — Z79899 Other long term (current) drug therapy: Secondary | ICD-10-CM | POA: Diagnosis not present

## 2018-01-13 DIAGNOSIS — Z5181 Encounter for therapeutic drug level monitoring: Secondary | ICD-10-CM | POA: Diagnosis not present

## 2018-01-15 DIAGNOSIS — Z79899 Other long term (current) drug therapy: Secondary | ICD-10-CM | POA: Diagnosis not present

## 2018-01-15 DIAGNOSIS — Z5181 Encounter for therapeutic drug level monitoring: Secondary | ICD-10-CM | POA: Diagnosis not present

## 2018-01-17 NOTE — Telephone Encounter (Signed)
-----   Message from Lizbeth BarkMandesia R Hairston, FNP sent at 01/07/2018  8:59 AM EST ----- Lipid levels were elevated. This can increase your risk of heart disease overtime. You will be prescribed atorvastatin. Start eating a diet low in saturated fat. Limit your intake of fried foods, red meats, and whole milk. Increase physical activity. Recommend follow up in 3 months.

## 2018-01-17 NOTE — Telephone Encounter (Signed)
Medical Assistant left message on patient's home and cell voicemail. Voicemail states [place] [facility] [place] [facility] [phone]. !!!Please inform patient of lipids being elevated. Patient should pick up atorvastatin and limit saturated fat intake. Patient will have a recheck in 3 months!!!

## 2018-01-20 DIAGNOSIS — Z5181 Encounter for therapeutic drug level monitoring: Secondary | ICD-10-CM | POA: Diagnosis not present

## 2018-01-20 DIAGNOSIS — Z79899 Other long term (current) drug therapy: Secondary | ICD-10-CM | POA: Diagnosis not present

## 2018-01-22 DIAGNOSIS — Z5181 Encounter for therapeutic drug level monitoring: Secondary | ICD-10-CM | POA: Diagnosis not present

## 2018-01-22 DIAGNOSIS — Z79899 Other long term (current) drug therapy: Secondary | ICD-10-CM | POA: Diagnosis not present

## 2018-02-05 DIAGNOSIS — Z5181 Encounter for therapeutic drug level monitoring: Secondary | ICD-10-CM | POA: Diagnosis not present

## 2018-02-05 DIAGNOSIS — Z79899 Other long term (current) drug therapy: Secondary | ICD-10-CM | POA: Diagnosis not present

## 2018-02-11 MED FILL — HYDROCHLOROTHIAZIDE 25 MG T: 25 | 30 days supply | Qty: 30 | Fill #1

## 2018-02-11 MED FILL — $VIAGRA 100 MG TABLET: 100 | 30 days supply | Qty: 10 | Fill #1

## 2018-02-11 MED FILL — LOSARTAN POTASSIUM 50 MG TA: 50 | 30 days supply | Qty: 30 | Fill #1

## 2018-02-11 MED FILL — AMLODIPINE BESYLATE 10 MG T: 10 | 30 days supply | Qty: 30 | Fill #1

## 2018-02-17 DIAGNOSIS — Z79899 Other long term (current) drug therapy: Secondary | ICD-10-CM | POA: Diagnosis not present

## 2018-02-17 DIAGNOSIS — Z5181 Encounter for therapeutic drug level monitoring: Secondary | ICD-10-CM | POA: Diagnosis not present

## 2018-02-19 DIAGNOSIS — Z79899 Other long term (current) drug therapy: Secondary | ICD-10-CM | POA: Diagnosis not present

## 2018-02-19 DIAGNOSIS — Z5181 Encounter for therapeutic drug level monitoring: Secondary | ICD-10-CM | POA: Diagnosis not present

## 2018-02-24 DIAGNOSIS — Z5181 Encounter for therapeutic drug level monitoring: Secondary | ICD-10-CM | POA: Diagnosis not present

## 2018-02-24 DIAGNOSIS — Z79899 Other long term (current) drug therapy: Secondary | ICD-10-CM | POA: Diagnosis not present

## 2018-02-26 DIAGNOSIS — Z79899 Other long term (current) drug therapy: Secondary | ICD-10-CM | POA: Diagnosis not present

## 2018-02-26 DIAGNOSIS — Z5181 Encounter for therapeutic drug level monitoring: Secondary | ICD-10-CM | POA: Diagnosis not present

## 2018-03-03 DIAGNOSIS — Z5181 Encounter for therapeutic drug level monitoring: Secondary | ICD-10-CM | POA: Diagnosis not present

## 2018-03-03 DIAGNOSIS — Z79899 Other long term (current) drug therapy: Secondary | ICD-10-CM | POA: Diagnosis not present

## 2018-03-05 DIAGNOSIS — Z79899 Other long term (current) drug therapy: Secondary | ICD-10-CM | POA: Diagnosis not present

## 2018-03-05 DIAGNOSIS — Z5181 Encounter for therapeutic drug level monitoring: Secondary | ICD-10-CM | POA: Diagnosis not present

## 2018-03-10 DIAGNOSIS — Z79899 Other long term (current) drug therapy: Secondary | ICD-10-CM | POA: Diagnosis not present

## 2018-03-10 DIAGNOSIS — Z5181 Encounter for therapeutic drug level monitoring: Secondary | ICD-10-CM | POA: Diagnosis not present

## 2018-03-12 DIAGNOSIS — Z5181 Encounter for therapeutic drug level monitoring: Secondary | ICD-10-CM | POA: Diagnosis not present

## 2018-03-12 DIAGNOSIS — Z79899 Other long term (current) drug therapy: Secondary | ICD-10-CM | POA: Diagnosis not present

## 2018-03-17 DIAGNOSIS — Z79899 Other long term (current) drug therapy: Secondary | ICD-10-CM | POA: Diagnosis not present

## 2018-03-17 DIAGNOSIS — Z5181 Encounter for therapeutic drug level monitoring: Secondary | ICD-10-CM | POA: Diagnosis not present

## 2018-03-19 DIAGNOSIS — Z79899 Other long term (current) drug therapy: Secondary | ICD-10-CM | POA: Diagnosis not present

## 2018-03-19 DIAGNOSIS — Z5181 Encounter for therapeutic drug level monitoring: Secondary | ICD-10-CM | POA: Diagnosis not present

## 2018-03-19 MED FILL — $VIAGRA 100 MG TABLET: 100 | 30 days supply | Qty: 10 | Fill #2

## 2018-03-19 MED FILL — AMLODIPINE BESYLATE 10 MG T: 10 | 30 days supply | Qty: 30 | Fill #2

## 2018-03-19 MED FILL — LOSARTAN POTASSIUM 50 MG TA: 50 | 30 days supply | Qty: 30 | Fill #2

## 2018-03-19 MED FILL — HYDROCHLOROTHIAZIDE 25 MG T: 25 | 30 days supply | Qty: 30 | Fill #2

## 2018-03-25 DIAGNOSIS — Z79899 Other long term (current) drug therapy: Secondary | ICD-10-CM | POA: Diagnosis not present

## 2018-03-25 DIAGNOSIS — Z5181 Encounter for therapeutic drug level monitoring: Secondary | ICD-10-CM | POA: Diagnosis not present

## 2018-04-01 DIAGNOSIS — Z79899 Other long term (current) drug therapy: Secondary | ICD-10-CM | POA: Diagnosis not present

## 2018-04-01 DIAGNOSIS — Z5181 Encounter for therapeutic drug level monitoring: Secondary | ICD-10-CM | POA: Diagnosis not present

## 2018-04-03 DIAGNOSIS — Z79899 Other long term (current) drug therapy: Secondary | ICD-10-CM | POA: Diagnosis not present

## 2018-04-03 DIAGNOSIS — Z5181 Encounter for therapeutic drug level monitoring: Secondary | ICD-10-CM | POA: Diagnosis not present

## 2018-04-07 DIAGNOSIS — Z5181 Encounter for therapeutic drug level monitoring: Secondary | ICD-10-CM | POA: Diagnosis not present

## 2018-04-07 DIAGNOSIS — Z79899 Other long term (current) drug therapy: Secondary | ICD-10-CM | POA: Diagnosis not present

## 2018-04-09 DIAGNOSIS — Z79899 Other long term (current) drug therapy: Secondary | ICD-10-CM | POA: Diagnosis not present

## 2018-04-09 DIAGNOSIS — Z5181 Encounter for therapeutic drug level monitoring: Secondary | ICD-10-CM | POA: Diagnosis not present

## 2018-04-11 ENCOUNTER — Other Ambulatory Visit: Payer: Self-pay

## 2018-04-11 ENCOUNTER — Telehealth: Payer: Self-pay | Admitting: Family Medicine

## 2018-04-11 DIAGNOSIS — N529 Male erectile dysfunction, unspecified: Secondary | ICD-10-CM

## 2018-04-11 MED ORDER — SILDENAFIL CITRATE 100 MG PO TABS
100.0000 mg | ORAL_TABLET | Freq: Every day | ORAL | 0 refills | Status: DC | PRN
Start: 2018-04-11 — End: 2018-07-07

## 2018-04-11 NOTE — Telephone Encounter (Signed)
Done

## 2018-04-11 NOTE — Telephone Encounter (Signed)
Patient called and requested for listed medications to be refilled and sent to [facility] pharmacy sildenafil (VIAGRA) 100 MG tablet [[id number]]

## 2018-04-14 DIAGNOSIS — Z79899 Other long term (current) drug therapy: Secondary | ICD-10-CM | POA: Diagnosis not present

## 2018-04-14 DIAGNOSIS — Z5181 Encounter for therapeutic drug level monitoring: Secondary | ICD-10-CM | POA: Diagnosis not present

## 2018-04-15 MED FILL — $VIAGRA 100 MG TABLET: 100 | 30 days supply | Qty: 10 | Fill #3

## 2018-04-16 DIAGNOSIS — Z79899 Other long term (current) drug therapy: Secondary | ICD-10-CM | POA: Diagnosis not present

## 2018-04-16 DIAGNOSIS — Z5181 Encounter for therapeutic drug level monitoring: Secondary | ICD-10-CM | POA: Diagnosis not present

## 2018-04-22 DIAGNOSIS — Z5181 Encounter for therapeutic drug level monitoring: Secondary | ICD-10-CM | POA: Diagnosis not present

## 2018-04-22 DIAGNOSIS — Z79899 Other long term (current) drug therapy: Secondary | ICD-10-CM | POA: Diagnosis not present

## 2018-04-24 DIAGNOSIS — Z5181 Encounter for therapeutic drug level monitoring: Secondary | ICD-10-CM | POA: Diagnosis not present

## 2018-04-24 DIAGNOSIS — Z79899 Other long term (current) drug therapy: Secondary | ICD-10-CM | POA: Diagnosis not present

## 2018-04-29 DIAGNOSIS — Z5181 Encounter for therapeutic drug level monitoring: Secondary | ICD-10-CM | POA: Diagnosis not present

## 2018-04-29 DIAGNOSIS — Z79899 Other long term (current) drug therapy: Secondary | ICD-10-CM | POA: Diagnosis not present

## 2018-05-01 DIAGNOSIS — Z79899 Other long term (current) drug therapy: Secondary | ICD-10-CM | POA: Diagnosis not present

## 2018-05-01 DIAGNOSIS — Z5181 Encounter for therapeutic drug level monitoring: Secondary | ICD-10-CM | POA: Diagnosis not present

## 2018-05-19 MED FILL — $VIAGRA 100 MG TABLET: 100 | 30 days supply | Qty: 10 | Fill #4

## 2018-05-28 DIAGNOSIS — Z5181 Encounter for therapeutic drug level monitoring: Secondary | ICD-10-CM | POA: Diagnosis not present

## 2018-05-28 DIAGNOSIS — Z79899 Other long term (current) drug therapy: Secondary | ICD-10-CM | POA: Diagnosis not present

## 2018-05-30 DIAGNOSIS — Z5181 Encounter for therapeutic drug level monitoring: Secondary | ICD-10-CM | POA: Diagnosis not present

## 2018-05-30 DIAGNOSIS — Z79899 Other long term (current) drug therapy: Secondary | ICD-10-CM | POA: Diagnosis not present

## 2018-06-02 DIAGNOSIS — Z5181 Encounter for therapeutic drug level monitoring: Secondary | ICD-10-CM | POA: Diagnosis not present

## 2018-06-02 DIAGNOSIS — Z79899 Other long term (current) drug therapy: Secondary | ICD-10-CM | POA: Diagnosis not present

## 2018-06-04 DIAGNOSIS — Z5181 Encounter for therapeutic drug level monitoring: Secondary | ICD-10-CM | POA: Diagnosis not present

## 2018-06-04 DIAGNOSIS — Z79899 Other long term (current) drug therapy: Secondary | ICD-10-CM | POA: Diagnosis not present

## 2018-06-10 DIAGNOSIS — Z5181 Encounter for therapeutic drug level monitoring: Secondary | ICD-10-CM | POA: Diagnosis not present

## 2018-06-10 DIAGNOSIS — Z79899 Other long term (current) drug therapy: Secondary | ICD-10-CM | POA: Diagnosis not present

## 2018-06-12 DIAGNOSIS — Z79899 Other long term (current) drug therapy: Secondary | ICD-10-CM | POA: Diagnosis not present

## 2018-06-12 DIAGNOSIS — Z5181 Encounter for therapeutic drug level monitoring: Secondary | ICD-10-CM | POA: Diagnosis not present

## 2018-06-13 MED FILL — $VIAGRA 100 MG TABLET: 100 | 30 days supply | Qty: 10 | Fill #5

## 2018-07-07 ENCOUNTER — Ambulatory Visit: Payer: Medicare Other | Attending: Nurse Practitioner | Admitting: Nurse Practitioner

## 2018-07-07 ENCOUNTER — Encounter: Payer: Self-pay | Admitting: Nurse Practitioner

## 2018-07-07 VITALS — BP 148/80 | HR 71 | Temp 98.5°F | Ht 70.5 in | Wt 191.6 lb

## 2018-07-07 DIAGNOSIS — G4700 Insomnia, unspecified: Secondary | ICD-10-CM | POA: Insufficient documentation

## 2018-07-07 DIAGNOSIS — Z8673 Personal history of transient ischemic attack (TIA), and cerebral infarction without residual deficits: Secondary | ICD-10-CM | POA: Diagnosis not present

## 2018-07-07 DIAGNOSIS — N529 Male erectile dysfunction, unspecified: Secondary | ICD-10-CM | POA: Diagnosis not present

## 2018-07-07 DIAGNOSIS — Z792 Long term (current) use of antibiotics: Secondary | ICD-10-CM | POA: Insufficient documentation

## 2018-07-07 DIAGNOSIS — Z7984 Long term (current) use of oral hypoglycemic drugs: Secondary | ICD-10-CM | POA: Insufficient documentation

## 2018-07-07 DIAGNOSIS — Z79899 Other long term (current) drug therapy: Secondary | ICD-10-CM | POA: Diagnosis not present

## 2018-07-07 DIAGNOSIS — F17200 Nicotine dependence, unspecified, uncomplicated: Secondary | ICD-10-CM | POA: Diagnosis not present

## 2018-07-07 DIAGNOSIS — I10 Essential (primary) hypertension: Secondary | ICD-10-CM | POA: Diagnosis not present

## 2018-07-07 DIAGNOSIS — E782 Mixed hyperlipidemia: Secondary | ICD-10-CM | POA: Diagnosis not present

## 2018-07-07 DIAGNOSIS — Z760 Encounter for issue of repeat prescription: Secondary | ICD-10-CM | POA: Diagnosis not present

## 2018-07-07 DIAGNOSIS — Z09 Encounter for follow-up examination after completed treatment for conditions other than malignant neoplasm: Secondary | ICD-10-CM | POA: Insufficient documentation

## 2018-07-07 DIAGNOSIS — Z9889 Other specified postprocedural states: Secondary | ICD-10-CM | POA: Diagnosis not present

## 2018-07-07 DIAGNOSIS — Z79811 Long term (current) use of aromatase inhibitors: Secondary | ICD-10-CM | POA: Diagnosis not present

## 2018-07-07 MED ORDER — AMLODIPINE BESYLATE 10 MG PO TABS: 10.0000 mg | ORAL_TABLET | Freq: Every day | ORAL | 1 refills | Status: DC

## 2018-07-07 MED ORDER — HYDROCHLOROTHIAZIDE 25 MG PO TABS: 25.0000 mg | ORAL_TABLET | Freq: Every day | ORAL | 1 refills | Status: DC

## 2018-07-07 MED ORDER — LOSARTAN POTASSIUM 50 MG PO TABS: 50.0000 mg | ORAL_TABLET | Freq: Every day | ORAL | 6 refills | Status: DC

## 2018-07-07 MED ORDER — SILDENAFIL CITRATE 100 MG PO TABS: 100.0000 mg | ORAL_TABLET | Freq: Every day | ORAL | 0 refills | Status: DC | PRN

## 2018-07-07 MED ORDER — ATORVASTATIN CALCIUM 20 MG PO TABS: 20.0000 mg | ORAL_TABLET | Freq: Every day | ORAL | 2 refills | Status: DC

## 2018-07-07 MED FILL — $VIAGRA 100 MG TABLET: 100 | 30 days supply | Qty: 10 | Fill #0

## 2018-07-07 MED FILL — HYDROCHLOROTHIAZIDE 25 MG T: 25 | 90 days supply | Qty: 90 | Fill #0

## 2018-07-07 MED FILL — LOSARTAN POTASSIUM 50 MG TA: 50 | 90 days supply | Qty: 90 | Fill #0

## 2018-07-07 MED FILL — ATORVASTATIN 20 MG TABLET: 20 | 90 days supply | Qty: 90 | Fill #0

## 2018-07-07 MED FILL — AMLODIPINE BESYLATE 10 MG T: 10 | 90 days supply | Qty: 90 | Fill #0

## 2018-07-07 NOTE — Progress Notes (Signed)
Assessment & Plan:  James Riley was seen today for establish care and medication refill.  Diagnoses and all orders for this visit:  Essential hypertension -     amLODipine (NORVASC) 10 MG tablet; Take 1 tablet (10 mg total) by mouth daily. -     losartan (COZAAR) 50 MG tablet; Take 1 tablet (50 mg total) by mouth daily. -     hydrochlorothiazide (HYDRODIURIL) 25 MG tablet; Take 1 tablet (25 mg total) by mouth daily. -     CBC -     CMP14+EGFR  Mixed hyperlipidemia -     atorvastatin (LIPITOR) 20 MG tablet; Take 1 tablet (20 mg total) by mouth daily. INSTRUCTIONS: Work on a low fat, heart healthy diet and participate in regular aerobic exercise program by working out at least 150 minutes per week; 5 days a week-30 minutes per day. Avoid red meat, fried foods. junk foods, sodas, sugary drinks, unhealthy snacking, alcohol and smoking.  Drink at least 48oz of water per day and monitor your carbohydrate intake daily.    Erectile dysfunction, unspecified erectile dysfunction type -     sildenafil (VIAGRA) 100 MG tablet; Take 1 tablet (100 mg total) by mouth daily as needed for erectile dysfunction. -     PSA  Tobacco dependence James Riley was counseled on the dangers of tobacco use, and was advised to quit. Reviewed strategies to maximize success, including removing cigarettes and smoking materials from environment, stress management and support of family/friends as well as pharmacological alternatives including: Wellbutrin, Chantix, Nicotine patch, Nicotine gum or lozenges. Smoking cessation support: smoking cessation hotline: 1-800-QUIT-NOW.  Smoking cessation classes are also available through [facility]. Call [phone] or visit our website at [email or web address]   A total of 3 minutes was spent on counseling for smoking cessation and James Riley is not ready to quit.     Patient has been counseled on age-appropriate routine health concerns for screening and prevention.  These are reviewed and up-to-date. Referrals have been placed accordingly. Immunizations are up-to-date or declined.    Subjective:   Chief Complaint  Patient presents with  . Establish Care    Pt. is here to establish care for hypertension and cholesterol.  Pt. would like to talk to PCP regarding something to help him sleep.   . Medication Refill   [name] 67 y.o. male presents to office today to establish care for HTN and HPL.    CHRONIC HYPERTENSION Disease Monitoring  Blood pressure range BP Readings from Last 3 Encounters:  07/07/18 (!) 148/80  01/06/18 125/66  10/18/17 (!) 152/70   Chest pain: no   Dyspnea: no   Claudication: no  Medication compliance: no, He does not have his losartan pill bottle with him today. It is unclear if he has even been taking this medication. He does endorse compliance taking amlodipine 37m and HCTZ 228m  Medication Side Effects  Lightheadedness: no   Urinary frequency: no   Edema: no   Impotence: yes  Preventitive Healthcare:  Exercise: no   Diet Pattern: diet: general  Salt Restriction:  no   Hyperlipidemia Patient presents for follow up to hyperlipidemia.  He is not medication compliant and has not been taking his pravastatin. He is diet compliant and denies chest pain, exertional chest pressure/discomfort, feeding intolerance, lower extremity edema, palpitations, poor exercise tolerance and skin xanthelasma or statin intolerance including myalgias.  Lab Results  Component Value Date   CHOL 186 01/06/2018   Lab Results  Component Value Date   HDL 36 (L) 01/06/2018   Lab Results  Component Value Date   LDLCALC 108 (H) 01/06/2018   Lab Results  Component Value Date   TRIG 209 (H) 01/06/2018   Lab Results  Component Value Date   CHOLHDL 5.2 (H) 01/06/2018   Insomnia Difficulty staying asleep. He has not tried anything for insomnia. I have suggested he try Tylenol PM first. We will discuss its efficacy at his next  office visit.   Review of Systems  Constitutional: Negative for fever, malaise/fatigue and weight loss.  HENT: Negative.  Negative for nosebleeds.   Eyes: Negative.  Negative for blurred vision, double vision and photophobia.  Respiratory: Negative.  Negative for cough and shortness of breath.   Cardiovascular: Negative.  Negative for chest pain, palpitations and leg swelling.  Gastrointestinal: Negative.  Negative for heartburn, nausea and vomiting.  Genitourinary:       Erectile dysfunction: Taking viagra  Musculoskeletal: Negative.  Negative for myalgias.  Neurological: Negative.  Negative for dizziness, focal weakness, seizures and headaches.  Psychiatric/Behavioral: Negative.  Negative for suicidal ideas.    Past Medical History:  Diagnosis Date  . Hypercholesterolemia   . Hypertension   . Stroke [facility]) ~ 1989   "little weak on my right side since" (09/10/2016)    Past Surgical History:  Procedure Laterality Date  . APPENDECTOMY    . LAPAROSCOPIC APPENDECTOMY N/A 09/07/2016   Procedure: APPENDECTOMY LAPAROSCOPIC converted to open appendectomy;  Surgeon: Coralie Keens, MD;  Location: [facility] OR;  Service: General;  Laterality: N/A;    Family History  Problem Relation Age of Onset  . Diabetes Neg Hx   . Hypertension Neg Hx     Social History Reviewed with no changes to be made today.   Outpatient Medications Prior to Visit  Medication Sig Dispense Refill  . amLODipine (NORVASC) 10 MG tablet Take 1 tablet (10 mg total) by mouth daily. 30 tablet 6  . hydrochlorothiazide (HYDRODIURIL) 25 MG tablet Take 1 tablet (25 mg total) by mouth daily. 30 tablet 6  . sildenafil (VIAGRA) 100 MG tablet Take 1 tablet (100 mg total) by mouth daily as needed for erectile dysfunction. 30 tablet 0  . nicotine (NICODERM CQ - DOSED IN MG/24 HOURS) 21 mg/24hr patch Place 1 patch (21 mg total) onto the skin daily. (Patient not taking: Reported on 01/06/2018) 28 patch 1  . atorvastatin (LIPITOR) 20 MG  tablet Take 1 tablet (20 mg total) by mouth daily. (Patient not taking: Reported on 07/07/2018) 30 tablet 2  . carbamide peroxide (DEBROX) 6.5 % OTIC solution Place 5 drops into both ears 2 (two) times daily as needed. FOR 5 DAYS. (Patient not taking: Reported on 01/06/2018) 15 mL 0  . doxycycline (ADOXA) 100 MG tablet Take 1 tablet (100 mg total) by mouth 2 (two) times daily. (Patient not taking: Reported on 01/06/2018) 30 tablet 0  . losartan (COZAAR) 50 MG tablet Take 1 tablet (50 mg total) by mouth daily. (Patient not taking: Reported on 07/07/2018) 30 tablet 6  . Neomycin-Bacitracin-Polymyxin (NEOSPORIN ORIGINAL) [phone] OINT Use as directed (Patient not taking: Reported on 01/06/2018)  0  . sulfamethoxazole-trimethoprim (BACTRIM DS,SEPTRA DS) 800-160 MG tablet Take 1 tablet 2 (two) times daily by mouth. (Patient not taking: Reported on 01/06/2018) 14 tablet 0   No facility-administered medications prior to visit.     No Known Allergies     Objective:    BP (!) 148/80 (BP Location: Left Arm, Patient Position: Sitting,  Cuff Size: Large)   Pulse 71   Temp 98.5 F (36.9 C) (Oral)   Ht 5' 10.5" (1.791 m)   Wt 191 lb 9.6 oz (86.9 kg)   SpO2 97%   BMI 27.10 kg/m  Wt Readings from Last 3 Encounters:  07/07/18 191 lb 9.6 oz (86.9 kg)  01/06/18 193 lb 3.2 oz (87.6 kg)  10/18/17 193 lb 6.4 oz (87.7 kg)    Physical Exam  Constitutional: He is oriented to person, place, and time. He appears well-developed and well-nourished. He is cooperative.  HENT:  Head: Normocephalic and atraumatic.  Eyes: EOM are normal.  Neck: Normal range of motion.  Cardiovascular: Normal rate, regular rhythm and normal heart sounds. Exam reveals no gallop and no friction rub.  No murmur heard. Pulmonary/Chest: Effort normal and breath sounds normal. No tachypnea. No respiratory distress. He has no decreased breath sounds. He has no wheezes. He has no rhonchi. He has no rales. He exhibits no tenderness.  Abdominal:  Bowel sounds are normal.  Musculoskeletal: Normal range of motion. He exhibits no edema.  Neurological: He is alert and oriented to person, place, and time. Coordination normal.  Skin: Skin is warm and dry.  Psychiatric: He has a normal mood and affect. His behavior is normal. Judgment and thought content normal.  Nursing note and vitals reviewed.     Patient has been counseled extensively about nutrition and exercise as well as the importance of adherence with medications and regular follow-up. The patient was given clear instructions to go to ER or return to medical center if symptoms don't improve, worsen or new problems develop. The patient verbalized understanding.   Follow-up: Return in about 2 months (around 09/06/2018) for Physical ONLY no labs.   [name], FNP-BC [facility] and [facility] [place], [place]   07/07/2018, 11:50 AM

## 2018-07-08 LAB — CMP14+EGFR
ALBUMIN: 5 g/dL — AB (ref 3.6–4.8)
ALT: 18 IU/L (ref 0–44)
AST: 19 IU/L (ref 0–40)
Albumin/Globulin Ratio: 1.4 (ref 1.2–2.2)
Alkaline Phosphatase: 94 IU/L (ref 39–117)
BUN / CREAT RATIO: 12 (ref 10–24)
BUN: 13 mg/dL (ref 8–27)
Bilirubin Total: 0.5 mg/dL (ref 0.0–1.2)
CHLORIDE: 98 mmol/L (ref 96–106)
CO2: 22 mmol/L (ref 20–29)
Calcium: 10.4 mg/dL — ABNORMAL HIGH (ref 8.6–10.2)
Creatinine, Ser: 1.11 mg/dL (ref 0.76–1.27)
GFR calc non Af Amer: 68 mL/min/{1.73_m2} (ref >59)
GFR, EST AFRICAN AMERICAN: 79 mL/min/{1.73_m2} (ref >59)
GLUCOSE: 101 mg/dL — AB (ref 65–99)
Globulin, Total: 3.7 g/dL (ref 1.5–4.5)
Potassium: 3.5 mmol/L (ref 3.5–5.2)
Sodium: 139 mmol/L (ref 134–144)
TOTAL PROTEIN: 8.7 g/dL — AB (ref 6.0–8.5)

## 2018-07-08 LAB — CBC
Hematocrit: 46.5 % (ref 37.5–51.0)
Hemoglobin: 17.2 g/dL (ref 13.0–17.7)
MCH: 33.3 pg — ABNORMAL HIGH (ref 26.6–33.0)
MCHC: 37 g/dL — ABNORMAL HIGH (ref 31.5–35.7)
MCV: 90 fL (ref 79–97)
Platelets: 212 x10E3/uL (ref 150–450)
RBC: 5.16 x10E6/uL (ref 4.14–5.80)
RDW: 14.6 % (ref 12.3–15.4)
WBC: 7 x10E3/uL (ref 3.4–10.8)

## 2018-07-08 LAB — PSA: Prostate Specific Ag, Serum: 0.8 ng/mL (ref 0.0–4.0)

## 2018-07-11 ENCOUNTER — Telehealth: Payer: Self-pay

## 2018-07-11 NOTE — Telephone Encounter (Signed)
CMA attempt to call patient to inform on lab results.  No answer and left a VM for patient to call back.  If patient call back, please inform:  Labs are essentially normal. There are some minor variations in your blood work that do not require any additional work up at this time.  Will continue to monitor. PSA for prostate level is normal. Make sure you are drinking at least 48 oz of water per day. Work on eating a low fat, heart healthy diet and participate in regular aerobic exercise program to control as well. Exercise at least  30 minutes per day-5 days per week. Avoid red meat. No fried foods. No junk foods, sodas, sugary foods or drinks, unhealthy snacking, alcohol or smoking.

## 2018-07-11 NOTE — Telephone Encounter (Signed)
-----   Message from Claiborne RiggZelda W Fleming, NP sent at 07/09/2018 11:42 PM EDT ----- Labs are essentially normal. There are some minor variations in your blood work that do not require any additional work up at this time.  Will continue to monitor. PSA for prostate level is normal. Make sure you are drinking at least 48 oz of water per day. Work on eating a low fat, heart healthy diet and participate in regular aerobic exercise program to control as well. Exercise at least  30 minutes per day-5 days per week. Avoid red meat. No fried foods. No junk foods, sodas, sugary foods or drinks, unhealthy snacking, alcohol or smoking.

## 2018-08-11 ENCOUNTER — Telehealth: Payer: Self-pay | Admitting: Nurse Practitioner

## 2018-08-11 NOTE — Telephone Encounter (Signed)
1) Medication(s) Requested (by name): sildenafil (VIAGRA) 100 MG tablet [[id number]]   2) Pharmacy of Choice: [facility]   3) Special Requests: Patient is requesting a call back whether it's been approved or denied  Approved medications will be sent to the pharmacy, we will reach out if there is an issue.  Requests made after 3pm may not be addressed until the following business day!  If a patient is unsure of the name of the medication(s) please note and ask patient to call back when they are able to provide all info, do not send to responsible party until all information is available!

## 2018-08-11 NOTE — Telephone Encounter (Signed)
Pt still had refills on file with the pharmacy. I submitted the refill for him, it should be ready by this afternoon.

## 2018-09-02 DIAGNOSIS — Z23 Encounter for immunization: Secondary | ICD-10-CM | POA: Diagnosis not present

## 2018-09-08 ENCOUNTER — Encounter: Payer: Medicare Other | Admitting: Nurse Practitioner

## 2018-09-09 ENCOUNTER — Encounter: Payer: Medicare Other | Admitting: Nurse Practitioner

## 2018-10-03 ENCOUNTER — Ambulatory Visit: Payer: Medicare Other | Attending: Nurse Practitioner | Admitting: Nurse Practitioner

## 2018-10-03 ENCOUNTER — Encounter: Payer: Self-pay | Admitting: Nurse Practitioner

## 2018-10-03 VITALS — BP 126/73 | HR 81 | Temp 98.4°F | Ht 70.5 in | Wt 193.2 lb

## 2018-10-03 DIAGNOSIS — F17210 Nicotine dependence, cigarettes, uncomplicated: Secondary | ICD-10-CM | POA: Insufficient documentation

## 2018-10-03 DIAGNOSIS — N529 Male erectile dysfunction, unspecified: Secondary | ICD-10-CM | POA: Diagnosis not present

## 2018-10-03 DIAGNOSIS — E782 Mixed hyperlipidemia: Secondary | ICD-10-CM | POA: Diagnosis not present

## 2018-10-03 DIAGNOSIS — Z79899 Other long term (current) drug therapy: Secondary | ICD-10-CM | POA: Diagnosis not present

## 2018-10-03 DIAGNOSIS — Z0000 Encounter for general adult medical examination without abnormal findings: Secondary | ICD-10-CM | POA: Diagnosis not present

## 2018-10-03 DIAGNOSIS — Z8673 Personal history of transient ischemic attack (TIA), and cerebral infarction without residual deficits: Secondary | ICD-10-CM | POA: Diagnosis not present

## 2018-10-03 DIAGNOSIS — I10 Essential (primary) hypertension: Secondary | ICD-10-CM | POA: Diagnosis not present

## 2018-10-03 MED ORDER — NICOTINE 7 MG/24HR TD PT24: 7.0000 mg | MEDICATED_PATCH | Freq: Every day | TRANSDERMAL | 0 refills | Status: AC

## 2018-10-03 MED ORDER — NICOTINE 21 MG/24HR TD PT24: 21.0000 mg | MEDICATED_PATCH | Freq: Every day | TRANSDERMAL | 1 refills | Status: AC

## 2018-10-03 MED ORDER — SILDENAFIL CITRATE 100 MG PO TABS: 100.0000 mg | ORAL_TABLET | Freq: Every day | ORAL | 0 refills | Status: DC | PRN

## 2018-10-03 MED ORDER — NICOTINE 14 MG/24HR TD PT24: 14.0000 mg | MEDICATED_PATCH | Freq: Every day | TRANSDERMAL | 0 refills | Status: AC

## 2018-10-03 MED ORDER — ASPIRIN EC 325 MG PO TBEC: 325.0000 mg | DELAYED_RELEASE_TABLET | Freq: Every day | ORAL | 3 refills | Status: AC

## 2018-10-03 NOTE — Progress Notes (Signed)
Assessment & Plan:  James Riley was seen today for annual exam.  Diagnoses and all orders for this visit:  Encounter for annual physical exam   Mixed hyperlipidemia -     Lipid panel Taking atorvastatin 20mg  daily as prescribed. Lab Results  Component Value Date   LDLCALC 108 (H) 01/06/2018  INSTRUCTIONS: Work on a low fat, heart healthy diet and participate in regular aerobic exercise program by working out at least 150 minutes per week; 5 days a week-30 minutes per day. Avoid red meat, fried foods. junk foods, sodas, sugary drinks, unhealthy snacking, alcohol and smoking.  Drink at least 48oz of water per day and monitor your carbohydrate intake daily.   Tobacco dependence due to cigarettes -     nicotine (NICODERM CQ - DOSED IN MG/24 HOURS) 21 mg/24hr patch; Place 1 patch (21 mg total) onto the skin daily. -     nicotine (NICODERM CQ - DOSED IN MG/24 HR) 7 mg/24hr patch; Place 1 patch (7 mg total) onto the skin daily for 14 days. -     nicotine (NICODERM CQ - DOSED IN MG/24 HOURS) 14 mg/24hr patch; Place 1 patch (14 mg total) onto the skin daily for 14 days.  Erectile dysfunction, unspecified erectile dysfunction type -     sildenafil (VIAGRA) 100 MG tablet; Take 1 tablet (100 mg total) by mouth daily as needed for erectile dysfunction.  History of stroke -     aspirin EC 325 MG tablet; Take 1 tablet (325 mg total) by mouth daily.    Patient has been counseled on age-appropriate routine health concerns for screening and prevention. These are reviewed and up-to-date. Referrals have been placed accordingly. Immunizations are up-to-date or declined.    Subjective:   Chief Complaint  Patient presents with  . Annual Exam   [name] 67 y.o. male presents to office today for annual physical.    Review of Systems  Constitutional: Negative for fever, malaise/fatigue and weight loss.  HENT: Negative.  Negative for nosebleeds.   Eyes: Negative.  Negative for blurred  vision, double vision and photophobia.  Respiratory: Negative.  Negative for cough and shortness of breath.   Cardiovascular: Negative.  Negative for chest pain, palpitations and leg swelling.  Gastrointestinal: Negative.  Negative for heartburn, nausea and vomiting.  Genitourinary: Negative.   Musculoskeletal: Negative.  Negative for myalgias.  Skin: Negative.   Neurological: Negative.  Negative for dizziness, focal weakness, seizures and headaches.  Endo/Heme/Allergies: Negative.   Psychiatric/Behavioral: Negative.  Negative for suicidal ideas.    Past Medical History:  Diagnosis Date  . Hypercholesterolemia   . Hypertension   . Stroke [facility]) ~ 1989   "little weak on my right side since" (09/10/2016)    Past Surgical History:  Procedure Laterality Date  . APPENDECTOMY    . LAPAROSCOPIC APPENDECTOMY N/A 09/07/2016   Procedure: APPENDECTOMY LAPAROSCOPIC converted to open appendectomy;  Surgeon: Abigail Miyamoto, MD;  Location: [facility] OR;  Service: General;  Laterality: N/A;    Family History  Problem Relation Age of Onset  . Diabetes Neg Hx   . Hypertension Neg Hx     Social History Reviewed with no changes to be made today.   Outpatient Medications Prior to Visit  Medication Sig Dispense Refill  . amLODipine (NORVASC) 10 MG tablet Take 1 tablet (10 mg total) by mouth daily. 90 tablet 1  . atorvastatin (LIPITOR) 20 MG tablet Take 1 tablet (20 mg total) by mouth daily. 90 tablet 2  .  hydrochlorothiazide (HYDRODIURIL) 25 MG tablet Take 1 tablet (25 mg total) by mouth daily. 90 tablet 1  . losartan (COZAAR) 50 MG tablet Take 1 tablet (50 mg total) by mouth daily. 90 tablet 6  . nicotine (NICODERM CQ - DOSED IN MG/24 HOURS) 21 mg/24hr patch Place 1 patch (21 mg total) onto the skin daily. (Patient not taking: Reported on 01/06/2018) 28 patch 1  . sildenafil (VIAGRA) 100 MG tablet Take 1 tablet (100 mg total) by mouth daily as needed for erectile dysfunction. (Patient not taking: Reported  on 10/03/2018) 30 tablet 0   No facility-administered medications prior to visit.     No Known Allergies     Objective:    BP 126/73 (BP Location: Left Arm, Patient Position: Sitting, Cuff Size: Large)   Pulse 81   Temp 98.4 F (36.9 C) (Oral)   Ht 5' 10.5" (1.791 m)   Wt 193 lb 3.2 oz (87.6 kg)   SpO2 99%   BMI 27.33 kg/m  Wt Readings from Last 3 Encounters:  10/03/18 193 lb 3.2 oz (87.6 kg)  07/07/18 191 lb 9.6 oz (86.9 kg)  01/06/18 193 lb 3.2 oz (87.6 kg)    Physical Exam  Constitutional: He is oriented to person, place, and time. He appears well-developed and well-nourished. He is cooperative.  HENT:  Head: Normocephalic and atraumatic.  Right Ear: Hearing, tympanic membrane, external ear and ear canal normal.  Left Ear: Hearing, tympanic membrane, external ear and ear canal normal.  Nose: Nose normal. No mucosal edema or rhinorrhea.  Mouth/Throat: Uvula is midline, oropharynx is clear and moist and mucous membranes are normal. Tonsils are 1+ on the right. Tonsils are 1+ on the left. No tonsillar exudate.  Eyes: Pupils are equal, round, and reactive to light. Conjunctivae, EOM and lids are normal. No scleral icterus.  Neck: Normal range of motion. Neck supple. No tracheal deviation present. No thyromegaly present.  Cardiovascular: Normal rate, regular rhythm, normal heart sounds and intact distal pulses. Exam reveals no gallop and no friction rub.  No murmur heard. Pulses:      Dorsalis pedis pulses are 2+ on the right side, and 2+ on the left side.       Posterior tibial pulses are 2+ on the right side, and 2+ on the left side.  Pulmonary/Chest: Effort normal and breath sounds normal. No tachypnea. No respiratory distress. He has no decreased breath sounds. He has no wheezes. He has no rhonchi. He has no rales. He exhibits no mass and no tenderness. Right breast exhibits no inverted nipple, no mass, no nipple discharge, no skin change and no tenderness. Left breast  exhibits no inverted nipple, no mass, no nipple discharge, no skin change and no tenderness.  Abdominal: Soft. Bowel sounds are normal. He exhibits no distension and no mass. There is no tenderness. There is no rebound and no guarding. Hernia confirmed negative in the right inguinal area and confirmed negative in the left inguinal area.  Genitourinary: Testes normal and penis normal. Right testis shows no mass, no swelling and no tenderness. Right testis is descended. Cremasteric reflex is not absent on the right side. Left testis shows no mass, no swelling and no tenderness. Left testis is descended. Cremasteric reflex is not absent on the left side.  Musculoskeletal: Normal range of motion. He exhibits no edema, tenderness or deformity.       Right foot: There is normal range of motion and no deformity.       Left  foot: There is normal range of motion and no deformity.  Feet:  Right Foot:  Protective Sensation: 10 sites tested. 10 sites sensed.  Skin Integrity: Positive for callus and dry skin. Negative for skin breakdown.  Left Foot:  Protective Sensation: 10 sites tested. 10 sites sensed.  Skin Integrity: Positive for callus and dry skin. Negative for skin breakdown.  Lymphadenopathy:    He has no cervical adenopathy. No inguinal adenopathy noted on the right or left side.       Right: No inguinal adenopathy present.       Left: No inguinal adenopathy present.  Neurological: He is alert and oriented to person, place, and time. He displays normal reflexes. No cranial nerve deficit or sensory deficit. He exhibits normal muscle tone. Coordination and gait normal.  Reflex Scores:      Patellar reflexes are 2+ on the right side and 1+ on the left side. Strength Right 4/5 Strength Left 4/5  Skin: Skin is warm and dry. Capillary refill takes less than 2 seconds. No erythema.  Psychiatric: He has a normal mood and affect. His behavior is normal. Judgment and thought content normal.  Nursing note  and vitals reviewed.     Patient has been counseled extensively about nutrition and exercise as well as the importance of adherence with medications and regular follow-up. The patient was given clear instructions to go to ER or return to medical center if symptoms don't improve, worsen or new problems develop. The patient verbalized understanding.   Follow-up: Return in about 3 months (around 01/03/2019) for HTN.   Claiborne Rigg, FNP-BC [facility] and [facility] [place], [place] [phone]   10/03/2018, 1:39 PM

## 2018-10-04 LAB — LIPID PANEL
CHOL/HDL RATIO: 4.2 ratio (ref 0.0–5.0)
CHOLESTEROL TOTAL: 137 mg/dL (ref 100–199)
HDL: 33 mg/dL — ABNORMAL LOW (ref >39)
LDL Calculated: 65 mg/dL (ref 0–99)
TRIGLYCERIDES: 196 mg/dL — AB (ref 0–149)
VLDL Cholesterol Cal: 39 mg/dL (ref 5–40)

## 2018-10-13 ENCOUNTER — Telehealth: Payer: Self-pay

## 2018-10-13 NOTE — Telephone Encounter (Signed)
-----   Message from Claiborne Rigg, NP sent at 10/08/2018  8:59 PM EST ----- Cholesterol levels are abnormal. Make sure you are taking your lipitor as prescribed. Poorly controlled lipids can increase your risk of stroke or heart attack.

## 2018-10-13 NOTE — Telephone Encounter (Signed)
CMA attempt to reach patient to inform on results.  Patient was currently unavailable at the moment, and was unable to leave a message for patient.    A letter will be send out to reach patient.

## 2018-10-22 ENCOUNTER — Telehealth: Payer: Self-pay | Admitting: Nurse Practitioner

## 2018-10-22 NOTE — Telephone Encounter (Signed)
Patient called to get lab results. Please follow up with patient. °

## 2018-10-23 NOTE — Telephone Encounter (Signed)
CMA spoke to patient to inform on results.  Pt. Verified DOB. Pt. Understood.  

## 2019-01-06 ENCOUNTER — Ambulatory Visit: Payer: Medicare HMO | Attending: Nurse Practitioner | Admitting: Nurse Practitioner

## 2019-01-06 ENCOUNTER — Encounter: Payer: Self-pay | Admitting: Nurse Practitioner

## 2019-01-06 VITALS — BP 164/78 | HR 68 | Temp 98.1°F | Ht 70.5 in | Wt 194.2 lb

## 2019-01-06 DIAGNOSIS — R7303 Prediabetes: Secondary | ICD-10-CM | POA: Diagnosis not present

## 2019-01-06 DIAGNOSIS — E782 Mixed hyperlipidemia: Secondary | ICD-10-CM | POA: Diagnosis not present

## 2019-01-06 DIAGNOSIS — I10 Essential (primary) hypertension: Secondary | ICD-10-CM | POA: Diagnosis not present

## 2019-01-06 DIAGNOSIS — N529 Male erectile dysfunction, unspecified: Secondary | ICD-10-CM | POA: Diagnosis not present

## 2019-01-06 LAB — POCT GLYCOSYLATED HEMOGLOBIN (HGB A1C): HEMOGLOBIN A1C: 5.9 % — AB (ref 4.0–5.6)

## 2019-01-06 LAB — GLUCOSE, POCT (MANUAL RESULT ENTRY): POC GLUCOSE: 140 mg/dL — AB (ref 70–99)

## 2019-01-06 MED ORDER — HYDROCHLOROTHIAZIDE 25 MG PO TABS: 25.0000 mg | ORAL_TABLET | Freq: Every day | ORAL | 1 refills | Status: DC

## 2019-01-06 MED ORDER — ATORVASTATIN CALCIUM 20 MG PO TABS: 20.0000 mg | ORAL_TABLET | Freq: Every day | ORAL | 2 refills | Status: DC

## 2019-01-06 MED ORDER — SILDENAFIL CITRATE 100 MG PO TABS: 100.0000 mg | ORAL_TABLET | Freq: Every day | ORAL | 3 refills | Status: DC | PRN

## 2019-01-06 MED ORDER — LOSARTAN POTASSIUM 50 MG PO TABS: 50.0000 mg | ORAL_TABLET | Freq: Every day | ORAL | 6 refills | Status: DC

## 2019-01-06 MED ORDER — AMLODIPINE BESYLATE 10 MG PO TABS: 10.0000 mg | ORAL_TABLET | Freq: Every day | ORAL | 1 refills | Status: DC

## 2019-01-06 MED FILL — HYDROCHLOROTHIAZIDE 25 MG T: 25 | 90 days supply | Qty: 90 | Fill #0

## 2019-01-06 MED FILL — AMLODIPINE BESYLATE 10 MG T: 10 | 90 days supply | Qty: 90 | Fill #0

## 2019-01-06 MED FILL — !VIAGRA 100MG TABLET: 100 | 30 days supply | Qty: 10 | Fill #0

## 2019-01-06 MED FILL — ATORVASTATIN CALCIUM 20 MG: 20 | 90 days supply | Qty: 90 | Fill #0

## 2019-01-06 MED FILL — LOSARTAN POTASSIUM 50 MG TA: 50 | 90 days supply | Qty: 90 | Fill #0

## 2019-01-06 NOTE — Progress Notes (Signed)
Assessment & Plan:  James Riley was seen today for follow-up.  Diagnoses and all orders for this visit:  Essential hypertension -     losartan (COZAAR) 50 MG tablet; Take 1 tablet (50 mg total) by mouth daily. -     hydrochlorothiazide (HYDRODIURIL) 25 MG tablet; Take 1 tablet (25 mg total) by mouth daily. -     amLODipine (NORVASC) 10 MG tablet; Take 1 tablet (10 mg total) by mouth daily. -     CMP14+EGFR Continue all antihypertensives as prescribed.  Remember to bring in your blood pressure log with you for your follow up appointment.  DASH/Mediterranean Diets are healthier choices for HTN.    Prediabetes -     Glucose (CBG) -     HgB A1c  Mixed hyperlipidemia -     atorvastatin (LIPITOR) 20 MG tablet; Take 1 tablet (20 mg total) by mouth daily. INSTRUCTIONS: Work on a low fat, heart healthy diet and participate in regular aerobic exercise program by working out at least 150 minutes per week; 5 days a week-30 minutes per day. Avoid red meat, fried foods. junk foods, sodas, sugary drinks, unhealthy snacking, alcohol and smoking.  Drink at least 48oz of water per day and monitor your carbohydrate intake daily.  Thank so  Erectile dysfunction, unspecified erectile dysfunction type -     sildenafil (VIAGRA) 100 MG tablet; Take 1 tablet (100 mg total) by mouth daily as needed for erectile dysfunction. Chronic.  Viagra provides significant improvement    Patient has been counseled on age-appropriate routine health concerns for screening and prevention. These are reviewed and up-to-date. Referrals have been placed accordingly. Immunizations are up-to-date or declined.    Subjective:   Chief Complaint  Patient presents with  . Follow-up    Patient is here to follow-up on HTN.    [name] 68 y.o. male presents to office today for follow up to HTN, HPL and prediabetes.   Prediabetes Currently controlled. Highest A1c 6.0. He is non adherent with his diet. States he ate  cookies and drank a soda this morning. He does not exercise. Denies any hypo or hyperglycemic symptoms.  Lab Results  Component Value Date   HGBA1C 5.9 (A) 01/06/2019     Essential Hypertension Chronic and not well controlled today. He has been out of his antihypertensives for several months. Denies chest pain, shortness of breath, palpitations, lightheadedness, dizziness, headaches or BLE edema. Current medication regimen include amlodipine 10 mg daily, hydrochlorothiazide 25 mg daily losartan 50 mg daily BP Readings from Last 3 Encounters:  01/06/19 (!) 164/78  10/03/18 126/73  07/07/18 (!) 148/80   MIXED HYPERLIPIDEMIA LDL at goal.  He endorses medication compliance taking atorvastatin 20 mg daily.  He denies any statin intolerance or myalgias. Lab Results  Component Value Date   LDLCALC 65 10/03/2018    Review of Systems  Constitutional: Negative for fever, malaise/fatigue and weight loss.  HENT: Negative.  Negative for nosebleeds.   Eyes: Negative.  Negative for blurred vision, double vision and photophobia.  Respiratory: Negative.  Negative for cough and shortness of breath.   Cardiovascular: Negative.  Negative for chest pain, palpitations and leg swelling.  Gastrointestinal: Negative.  Negative for heartburn, nausea and vomiting.  Musculoskeletal: Negative.  Negative for myalgias.  Neurological: Negative.  Negative for dizziness, focal weakness, seizures and headaches.  Psychiatric/Behavioral: Negative.  Negative for suicidal ideas.    Past Medical History:  Diagnosis Date  . Hypercholesterolemia   . Hypertension   .  Stroke [facility]) ~ 1989   "little weak on my right side since" (09/10/2016)    Past Surgical History:  Procedure Laterality Date  . APPENDECTOMY    . LAPAROSCOPIC APPENDECTOMY N/A 09/07/2016   Procedure: APPENDECTOMY LAPAROSCOPIC converted to open appendectomy;  Surgeon: Coralie Keens, MD;  Location: [facility] OR;  Service: General;  Laterality: N/A;     Family History  Problem Relation Age of Onset  . Diabetes Neg Hx   . Hypertension Neg Hx     Social History Reviewed with no changes to be made today.   Outpatient Medications Prior to Visit  Medication Sig Dispense Refill  . amLODipine (NORVASC) 10 MG tablet Take 1 tablet (10 mg total) by mouth daily. 90 tablet 1  . atorvastatin (LIPITOR) 20 MG tablet Take 1 tablet (20 mg total) by mouth daily. 90 tablet 2  . hydrochlorothiazide (HYDRODIURIL) 25 MG tablet Take 1 tablet (25 mg total) by mouth daily. 90 tablet 1  . losartan (COZAAR) 50 MG tablet Take 1 tablet (50 mg total) by mouth daily. 90 tablet 6  . sildenafil (VIAGRA) 100 MG tablet Take 1 tablet (100 mg total) by mouth daily as needed for erectile dysfunction. (Patient not taking: Reported on 01/06/2019) 30 tablet 0   No facility-administered medications prior to visit.     No Known Allergies     Objective:    BP (!) 164/78 (BP Location: Right Arm, Patient Position: Sitting, Cuff Size: Normal)   Pulse 68   Temp 98.1 F (36.7 C) (Oral)   Ht 5' 10.5" (1.791 m)   Wt 194 lb 3.2 oz (88.1 kg)   SpO2 95%   BMI 27.47 kg/m  Wt Readings from Last 3 Encounters:  01/06/19 194 lb 3.2 oz (88.1 kg)  10/03/18 193 lb 3.2 oz (87.6 kg)  07/07/18 191 lb 9.6 oz (86.9 kg)    Physical Exam Vitals signs and nursing note reviewed.  Constitutional:      Appearance: He is well-developed.  HENT:     Head: Normocephalic and atraumatic.  Neck:     Musculoskeletal: Normal range of motion.  Cardiovascular:     Rate and Rhythm: Normal rate and regular rhythm.     Heart sounds: Normal heart sounds. No murmur. No friction rub. No gallop.   Pulmonary:     Effort: Pulmonary effort is normal. No tachypnea or respiratory distress.     Breath sounds: Normal breath sounds. No decreased breath sounds, wheezing, rhonchi or rales.  Chest:     Chest wall: No tenderness.  Abdominal:     General: Bowel sounds are normal.     Palpations: Abdomen is  soft.  Musculoskeletal: Normal range of motion.  Skin:    General: Skin is warm and dry.  Neurological:     Mental Status: He is alert and oriented to person, place, and time.     Coordination: Coordination normal.  Psychiatric:        Behavior: Behavior normal. Behavior is cooperative.        Thought Content: Thought content normal.        Judgment: Judgment normal.        Patient has been counseled extensively about nutrition and exercise as well as the importance of adherence with medications and regular follow-up. The patient was given clear instructions to go to ER or return to medical center if symptoms don't improve, worsen or new problems develop. The patient verbalized understanding.   Follow-up: Return in about 3 months (around 04/06/2019)  for HTN.   [name], FNP-BC [facility] and [facility] [place], [place]   01/06/2019, 9:22 AM

## 2019-01-07 LAB — CMP14+EGFR
A/G RATIO: 1.6 (ref 1.2–2.2)
ALT: 21 IU/L (ref 0–44)
AST: 22 IU/L (ref 0–40)
Albumin: 4.8 g/dL (ref 3.8–4.8)
Alkaline Phosphatase: 95 IU/L (ref 39–117)
BUN / CREAT RATIO: 9 — AB (ref 10–24)
BUN: 11 mg/dL (ref 8–27)
Bilirubin Total: 0.5 mg/dL (ref 0.0–1.2)
CALCIUM: 9.8 mg/dL (ref 8.6–10.2)
CO2: 19 mmol/L — ABNORMAL LOW (ref 20–29)
Chloride: 102 mmol/L (ref 96–106)
Creatinine, Ser: 1.16 mg/dL (ref 0.76–1.27)
GFR calc Af Amer: 74 mL/min/{1.73_m2} (ref >59)
GFR, EST NON AFRICAN AMERICAN: 64 mL/min/{1.73_m2} (ref >59)
GLOBULIN, TOTAL: 3 g/dL (ref 1.5–4.5)
Glucose: 111 mg/dL — ABNORMAL HIGH (ref 65–99)
Potassium: 3.9 mmol/L (ref 3.5–5.2)
SODIUM: 138 mmol/L (ref 134–144)
Total Protein: 7.8 g/dL (ref 6.0–8.5)

## 2019-01-08 ENCOUNTER — Telehealth: Payer: Self-pay

## 2019-01-08 NOTE — Telephone Encounter (Signed)
-----   Message from Claiborne Rigg, NP sent at 01/07/2019 10:52 PM EST ----- Labs are essentially normal. There are some minor variations in your blood work that do not require any additional work up at this time. Will continue to monitor. Make sure you are drinking at least 48 oz of water per day. Work on eating a low fat, heart healthy diet and participate in regular aerobic exercise program to control as well. Exercise at least  30 minutes per day-5 days per week. Avoid red meat. No fried foods. No junk foods, sodas, sugary foods or drinks, unhealthy snacking, alcohol or smoking.

## 2019-01-08 NOTE — Telephone Encounter (Signed)
CMA spoke to patient to inform on results.  Pt. Understood. Pt. Verified DOB.  

## 2019-04-07 ENCOUNTER — Ambulatory Visit: Payer: Medicare HMO | Admitting: Nurse Practitioner

## 2019-05-26 ENCOUNTER — Ambulatory Visit: Payer: Medicare HMO | Admitting: Nurse Practitioner

## 2019-06-02 ENCOUNTER — Ambulatory Visit: Payer: Medicare HMO | Attending: Nurse Practitioner | Admitting: Nurse Practitioner

## 2019-06-02 ENCOUNTER — Other Ambulatory Visit: Payer: Self-pay

## 2019-06-02 ENCOUNTER — Encounter: Payer: Self-pay | Admitting: Nurse Practitioner

## 2019-06-02 VITALS — BP 111/67 | HR 76 | Temp 98.6°F | Ht 70.5 in | Wt 187.0 lb

## 2019-06-02 DIAGNOSIS — E782 Mixed hyperlipidemia: Secondary | ICD-10-CM

## 2019-06-02 DIAGNOSIS — N529 Male erectile dysfunction, unspecified: Secondary | ICD-10-CM

## 2019-06-02 DIAGNOSIS — R7303 Prediabetes: Secondary | ICD-10-CM | POA: Diagnosis not present

## 2019-06-02 DIAGNOSIS — R3911 Hesitancy of micturition: Secondary | ICD-10-CM | POA: Diagnosis not present

## 2019-06-02 DIAGNOSIS — R252 Cramp and spasm: Secondary | ICD-10-CM

## 2019-06-02 DIAGNOSIS — I10 Essential (primary) hypertension: Secondary | ICD-10-CM

## 2019-06-02 LAB — POCT URINALYSIS DIP (CLINITEK)
Bilirubin, UA: NEGATIVE
Blood, UA: NEGATIVE
Glucose, UA: NEGATIVE mg/dL
Ketones, POC UA: NEGATIVE mg/dL
Leukocytes, UA: NEGATIVE
Nitrite, UA: NEGATIVE
POC PROTEIN,UA: NEGATIVE
Spec Grav, UA: 1.02 (ref 1.010–1.025)
Urobilinogen, UA: 0.2 E.U./dL
pH, UA: 5.5 (ref 5.0–8.0)

## 2019-06-02 LAB — GLUCOSE, POCT (MANUAL RESULT ENTRY): POC Glucose: 108 mg/dl — AB (ref 70–99)

## 2019-06-02 MED ORDER — LOSARTAN POTASSIUM 50 MG PO TABS: 50.0000 mg | ORAL_TABLET | Freq: Every day | ORAL | 6 refills | Status: DC

## 2019-06-02 MED ORDER — SILDENAFIL CITRATE 100 MG PO TABS: 100.0000 mg | ORAL_TABLET | Freq: Every day | ORAL | 3 refills | Status: DC | PRN

## 2019-06-02 MED ORDER — ATORVASTATIN CALCIUM 20 MG PO TABS: 20.0000 mg | ORAL_TABLET | Freq: Every day | ORAL | 2 refills | Status: DC

## 2019-06-02 MED ORDER — HYDROCHLOROTHIAZIDE 25 MG PO TABS: 25.0000 mg | ORAL_TABLET | Freq: Every day | ORAL | 1 refills | Status: DC

## 2019-06-02 MED ORDER — TAMSULOSIN HCL 0.4 MG PO CAPS: 0.4000 mg | ORAL_CAPSULE | Freq: Every day | ORAL | 3 refills | Status: AC

## 2019-06-02 MED ORDER — AMLODIPINE BESYLATE 10 MG PO TABS: 10.0000 mg | ORAL_TABLET | Freq: Every day | ORAL | 1 refills | Status: DC

## 2019-06-02 MED FILL — TAMSULOSIN HCL 0.4 MG CAP: 0.4 | 30 days supply | Qty: 30 | Fill #0

## 2019-06-02 MED FILL — AMLODIPINE BESYLATE 10 MG T: 10 | 90 days supply | Qty: 90 | Fill #0

## 2019-06-02 MED FILL — HYDROCHLOROTHIAZIDE 25 MG T: 25 | 90 days supply | Qty: 90 | Fill #0

## 2019-06-02 MED FILL — ATORVASTATIN 20 MG TABLET: 20 | 90 days supply | Qty: 90 | Fill #0

## 2019-06-02 MED FILL — LOSARTAN POTASSIUM 50 MG TA: 50 | 90 days supply | Qty: 90 | Fill #0

## 2019-06-02 NOTE — Progress Notes (Signed)
Assessment & Plan:  James Riley was seen today for follow-up.  Diagnoses and all orders for this visit:  Prediabetes -     Glucose  Controlled Continue blood sugar control as discussed in office today, low carbohydrate diet, and regular physical exercise as tolerated, 150 minutes per week (30 min each day, 5 days per week, or 50 min 3 days per week).    Essential hypertension -     Basic Metabolic Panel -     amLODipine (NORVASC) 10 MG tablet; Take 1 tablet (10 mg total) by mouth daily. -     hydrochlorothiazide (HYDRODIURIL) 25 MG tablet; Take 1 tablet (25 mg total) by mouth daily. -     losartan (COZAAR) 50 MG tablet; Take 1 tablet (50 mg total) by mouth daily. Continue all antihypertensives as prescribed.  Remember to bring in your blood pressure log with you for your follow up appointment.  DASH/Mediterranean Diets are healthier choices for HTN.   Mixed hyperlipidemia -     atorvastatin (LIPITOR) 20 MG tablet; Take 1 tablet (20 mg total) by mouth daily. INSTRUCTIONS: Work on a low fat, heart healthy diet and participate in regular aerobic exercise program by working out at least 150 minutes per week; 5 days a week-30 minutes per day. Avoid red meat, fried foods. junk foods, sodas, sugary drinks, unhealthy snacking, alcohol and smoking.  Drink at least 48oz of water per day and monitor your carbohydrate intake daily.    Erectile dysfunction, unspecified erectile dysfunction type -     sildenafil (VIAGRA) 100 MG tablet; Take 1 tablet (100 mg total) by mouth daily as needed for erectile dysfunction. -     tamsulosin (FLOMAX) 0.4 MG CAPS capsule; Take 1 capsule (0.4 mg total) by mouth daily. Patient will pick up scripts today.  Urinary hesitancy -     PSA -     POCT URINALYSIS DIP (CLINITEK)  Bilateral leg cramps -     Magnesium    Patient has been counseled on age-appropriate routine health concerns for screening and prevention. These are reviewed and up-to-date. Referrals have  been placed accordingly. Immunizations are up-to-date or declined.    Subjective:   Chief Complaint  Patient presents with  . Follow-up    Pt. is here for Hypertension follow up.    [name] 68 y.o. male presents to office today for follow up for HTN, Prediabetes, HPL. He does have complaints of low back pain. Started one month ago.   has a past medical history of Hypercholesterolemia, Hypertension, and Stroke ([place]) (~ 1989).   GU PROBLEM Patient complains of lower urinary tract symptoms. Patient reports moderate symptoms of BPH. Onset of symptoms was 2 months ago and was gradual in onset. His AUA Symptom Score is, 17/35 manifested as obstructive symptoms including incomplete emptying, intermittency. He has no family history of prostate cancer.  He reports frequency, incomplete emptying, intermittency and urgency.  He denies nocturia one time a night which has decreased from 2-3 times per night.   He denies dysuria, flank pain, gross hematuria, kidney stones and recurrent UTI.   Essential Hypertension Chronic and well controlled on amlodipine 10 mg daily, HCTZ 25 mg daily and losartan 50 mg daily. Denies chest pain, shortness of breath, palpitations, lightheadedness, dizziness, headaches or BLE edema. Does not strictly adhere to a low sodium diet.  BP Readings from Last 3 Encounters:  06/02/19 111/67  01/06/19 (!) 164/78  10/03/18 126/73    Prediabetes Chronic and well  controlled.  Highest A1c 6.4. He currently does not take any oral diabetic agents.  Denies any hypo-or hyperglycemic symptoms.  He does not monitor his blood glucose levels at home. Lab Results  Component Value Date   HGBA1C 5.9 (A) 01/06/2019    Hyperlipidemia Patient presents for follow up to hyperlipidemia.  Current statin: atorvastatin 10 mg. He is not consistently diet compliant and denies statin intolerance including myalgias. LDL at goal >70.  Lab Results  Component Value Date   CHOL 137  10/03/2018   Lab Results  Component Value Date   HDL 33 (L) 10/03/2018   Lab Results  Component Value Date   LDLCALC 65 10/03/2018   Lab Results  Component Value Date   TRIG 196 (H) 10/03/2018   Lab Results  Component Value Date   CHOLHDL 4.2 10/03/2018    Review of Systems  Constitutional: Negative for fever, malaise/fatigue and weight loss.  HENT: Negative.  Negative for nosebleeds.   Eyes: Negative.  Negative for blurred vision, double vision and photophobia.  Respiratory: Negative.  Negative for cough and shortness of breath.   Cardiovascular: Negative.  Negative for chest pain, palpitations and leg swelling.  Gastrointestinal: Negative.  Negative for heartburn, nausea and vomiting.  Genitourinary: Positive for frequency. Negative for dysuria, flank pain, hematuria and urgency.       SEE HPI  Musculoskeletal: Positive for back pain. Negative for myalgias.       Bilateral leg cramps  Neurological: Negative.  Negative for dizziness, focal weakness, seizures and headaches.  Psychiatric/Behavioral: Negative.  Negative for suicidal ideas.    Past Medical History:  Diagnosis Date  . Hypercholesterolemia   . Hypertension   . Stroke [facility]) ~ 1989   "little weak on my right side since" (09/10/2016)    Past Surgical History:  Procedure Laterality Date  . APPENDECTOMY    . LAPAROSCOPIC APPENDECTOMY N/A 09/07/2016   Procedure: APPENDECTOMY LAPAROSCOPIC converted to open appendectomy;  Surgeon: Abigail Miyamotoouglas Blackman, MD;  [facility];  Service: General;  Laterality: N/A;    Family History  Problem Relation Age of Onset  . Diabetes Neg Hx   . Hypertension Neg Hx     Social History Reviewed with no changes to be made today.   Outpatient Medications Prior to Visit  Medication Sig Dispense Refill  . amLODipine (NORVASC) 10 MG tablet Take 1 tablet (10 mg total) by mouth daily. 90 tablet 1  . atorvastatin (LIPITOR) 20 MG tablet Take 1 tablet (20 mg total) by mouth daily. 90  tablet 2  . hydrochlorothiazide (HYDRODIURIL) 25 MG tablet Take 1 tablet (25 mg total) by mouth daily. 90 tablet 1  . losartan (COZAAR) 50 MG tablet Take 1 tablet (50 mg total) by mouth daily. 90 tablet 6  . sildenafil (VIAGRA) 100 MG tablet Take 1 tablet (100 mg total) by mouth daily as needed for erectile dysfunction. (Patient not taking: Reported on 06/02/2019) 10 tablet 3   No facility-administered medications prior to visit.     No Known Allergies     Objective:    BP 111/67 (BP Location: Left Arm, Patient Position: Sitting, Cuff Size: Large)   Pulse 76   Temp 98.6 F (37 C) (Oral)   Ht 5' 10.5" (1.791 m)   Wt 187 lb (84.8 kg)   SpO2 98%   BMI 26.45 kg/m  Wt Readings from Last 3 Encounters:  06/02/19 187 lb (84.8 kg)  01/06/19 194 lb 3.2 oz (88.1 kg)  10/03/18 193 lb 3.2  oz (87.6 kg)    Physical Exam Vitals signs and nursing note reviewed.  Constitutional:      Appearance: He is well-developed.  HENT:     Head: Normocephalic and atraumatic.  Neck:     Musculoskeletal: Normal range of motion.  Cardiovascular:     Rate and Rhythm: Normal rate and regular rhythm.     Heart sounds: Normal heart sounds. No murmur. No friction rub. No gallop.   Pulmonary:     Effort: Pulmonary effort is normal. No tachypnea or respiratory distress.     Breath sounds: Normal breath sounds. No decreased breath sounds, wheezing, rhonchi or rales.  Chest:     Chest wall: No tenderness.  Abdominal:     General: Bowel sounds are normal.     Palpations: Abdomen is soft.  Musculoskeletal: Normal range of motion.     Thoracic back: He exhibits tenderness. He exhibits normal range of motion, no bony tenderness, no swelling and no edema.     Lumbar back: He exhibits normal range of motion, no tenderness, no bony tenderness, no swelling and no edema.  Skin:    General: Skin is warm and dry.  Neurological:     Mental Status: He is alert and oriented to person, place, and time.     Coordination:  Coordination normal.  Psychiatric:        Behavior: Behavior normal. Behavior is cooperative.        Thought Content: Thought content normal.        Judgment: Judgment normal.          Patient has been counseled extensively about nutrition and exercise as well as the importance of adherence with medications and regular follow-up. The patient was given clear instructions to go to ER or return to medical center if symptoms don't improve, worsen or new problems develop. The patient verbalized understanding.   Follow-up: Return in about 3 weeks (around 06/23/2019) for 3 week TELE FOR URINARY HESITANANCY then appt late august for Prediabetes.   Claiborne RiggZelda W , FNP-BC [facility] [place] , [place] [phone]   06/02/2019, 12:01 PM

## 2019-06-03 ENCOUNTER — Emergency Department (HOSPITAL_COMMUNITY): Payer: Medicare HMO

## 2019-06-03 ENCOUNTER — Encounter (HOSPITAL_COMMUNITY): Payer: Self-pay | Admitting: Emergency Medicine

## 2019-06-03 ENCOUNTER — Other Ambulatory Visit: Payer: Self-pay

## 2019-06-03 ENCOUNTER — Observation Stay (HOSPITAL_COMMUNITY)
Admission: EM | Admit: 2019-06-03 | Discharge: 2019-06-04 | Disposition: A | Discharge status: Home / Self Care | Payer: Medicare HMO | Attending: Internal Medicine | Admitting: Internal Medicine

## 2019-06-03 DIAGNOSIS — Z20828 Contact with and (suspected) exposure to other viral communicable diseases: Secondary | ICD-10-CM | POA: Diagnosis not present

## 2019-06-03 DIAGNOSIS — R531 Weakness: Secondary | ICD-10-CM

## 2019-06-03 DIAGNOSIS — I639 Cerebral infarction, unspecified: Secondary | ICD-10-CM | POA: Diagnosis present

## 2019-06-03 DIAGNOSIS — I10 Essential (primary) hypertension: Secondary | ICD-10-CM | POA: Diagnosis not present

## 2019-06-03 DIAGNOSIS — E876 Hypokalemia: Secondary | ICD-10-CM | POA: Diagnosis not present

## 2019-06-03 DIAGNOSIS — F17210 Nicotine dependence, cigarettes, uncomplicated: Secondary | ICD-10-CM | POA: Diagnosis not present

## 2019-06-03 DIAGNOSIS — I6350 Cerebral infarction due to unspecified occlusion or stenosis of unspecified cerebral artery: Secondary | ICD-10-CM | POA: Diagnosis not present

## 2019-06-03 DIAGNOSIS — Z8673 Personal history of transient ischemic attack (TIA), and cerebral infarction without residual deficits: Secondary | ICD-10-CM | POA: Diagnosis not present

## 2019-06-03 LAB — CBC
HCT: 40.9 % (ref 39.0–52.0)
Hemoglobin: 14.9 g/dL (ref 13.0–17.0)
MCH: 32.7 pg (ref 26.0–34.0)
MCHC: 36.4 g/dL — ABNORMAL HIGH (ref 30.0–36.0)
MCV: 89.9 fL (ref 80.0–100.0)
Platelets: 200 10*3/uL (ref 150–400)
RBC: 4.55 MIL/uL (ref 4.22–5.81)
RDW: 12.6 % (ref 11.5–15.5)
WBC: 9 10*3/uL (ref 4.0–10.5)
nRBC: 0 % (ref 0.0–0.2)

## 2019-06-03 LAB — BASIC METABOLIC PANEL
Anion gap: 12 (ref 5–15)
BUN/Creatinine Ratio: 10 (ref 10–24)
BUN: 14 mg/dL (ref 8–27)
BUN: 11 mg/dL (ref 8–23)
CO2: 24 mmol/L (ref 20–29)
CO2: 25 mmol/L (ref 22–32)
Calcium: 10.3 mg/dL — ABNORMAL HIGH (ref 8.6–10.2)
Calcium: 9.3 mg/dL (ref 8.9–10.3)
Chloride: 94 mmol/L — ABNORMAL LOW (ref 96–106)
Chloride: 93 mmol/L — ABNORMAL LOW (ref 98–111)
Creatinine, Ser: 1.34 mg/dL — ABNORMAL HIGH (ref 0.76–1.27)
Creatinine, Ser: 1.26 mg/dL — ABNORMAL HIGH (ref 0.61–1.24)
GFR calc Af Amer: 62 mL/min/{1.73_m2} (ref >59)
GFR calc Af Amer: >60 mL/min (ref >60)
GFR calc non Af Amer: 54 mL/min/{1.73_m2} — ABNORMAL LOW (ref >59)
GFR calc non Af Amer: 58 mL/min — ABNORMAL LOW (ref >60)
Glucose, Bld: 105 mg/dL — ABNORMAL HIGH (ref 70–99)
Glucose: 96 mg/dL (ref 65–99)
Potassium: 3.5 mmol/L (ref 3.5–5.2)
Potassium: 2.6 mmol/L — CL (ref 3.5–5.1)
Sodium: 138 mmol/L (ref 134–144)
Sodium: 130 mmol/L — ABNORMAL LOW (ref 135–145)

## 2019-06-03 LAB — PROTIME-INR
INR: 1.2 (ref 0.8–1.2)
Prothrombin Time: 14.8 seconds (ref 11.4–15.2)

## 2019-06-03 LAB — DIFFERENTIAL
Basophils Absolute: 0.1 10*3/uL (ref 0.0–0.1)
Basophils Relative: 1 %
Eosinophils Absolute: 0.1 10*3/uL (ref 0.0–0.5)
Eosinophils Relative: 1 %
Lymphocytes Relative: 28 %
Lymphs Abs: 2.5 10*3/uL (ref 0.7–4.0)
Monocytes Absolute: 0.7 10*3/uL (ref 0.1–1.0)
Monocytes Relative: 8 %
Neutro Abs: 5.5 10*3/uL (ref 1.7–7.7)
Neutrophils Relative %: 62 %

## 2019-06-03 LAB — I-STAT CHEM 8, ED
BUN: 12 mg/dL (ref 8–23)
Calcium, Ion: 1.12 mmol/L — ABNORMAL LOW (ref 1.15–1.40)
Chloride: 93 mmol/L — ABNORMAL LOW (ref 98–111)
Creatinine, Ser: 1.2 mg/dL (ref 0.61–1.24)
Glucose, Bld: 103 mg/dL — ABNORMAL HIGH (ref 70–99)
HCT: 44 % (ref 39.0–52.0)
Hemoglobin: 15 g/dL (ref 13.0–17.0)
Potassium: 2.7 mmol/L — CL (ref 3.5–5.1)
Sodium: 131 mmol/L — ABNORMAL LOW (ref 135–145)
TCO2: 25 mmol/L (ref 22–32)

## 2019-06-03 LAB — URINALYSIS, ROUTINE W REFLEX MICROSCOPIC
Bilirubin Urine: NEGATIVE
Glucose, UA: NEGATIVE mg/dL
Hgb urine dipstick: NEGATIVE
Ketones, ur: NEGATIVE mg/dL
Leukocytes,Ua: NEGATIVE
Nitrite: NEGATIVE
Protein, ur: NEGATIVE mg/dL
Specific Gravity, Urine: 1.002 — ABNORMAL LOW (ref 1.005–1.030)
pH: 5 (ref 5.0–8.0)

## 2019-06-03 LAB — MAGNESIUM
Magnesium: 2.4 mg/dL — ABNORMAL HIGH (ref 1.6–2.3)
Magnesium: 1.8 mg/dL (ref 1.7–2.4)

## 2019-06-03 LAB — PSA: Prostate Specific Ag, Serum: 0.8 ng/mL (ref 0.0–4.0)

## 2019-06-03 LAB — APTT: aPTT: 32 seconds (ref 24–36)

## 2019-06-03 MED ORDER — SODIUM CHLORIDE 0.9 % IV BOLUS
1000.0000 mL | Freq: Once | INTRAVENOUS | Status: AC
  Administered 2019-06-04: 01:00:00 1000 mL via INTRAVENOUS

## 2019-06-03 MED ORDER — SODIUM CHLORIDE 0.9% FLUSH: 3.0000 mL | Freq: Once | INTRAVENOUS | Status: DC

## 2019-06-03 MED ORDER — POTASSIUM CHLORIDE CRYS ER 20 MEQ PO TBCR
40.0000 meq | EXTENDED_RELEASE_TABLET | Freq: Once | ORAL | Status: AC
  Administered 2019-06-04: 01:00:00 40 meq via ORAL
  Filled 2019-06-03: qty 2

## 2019-06-03 MED ORDER — POTASSIUM CHLORIDE 10 MEQ/100ML IV SOLN
10.0000 meq | INTRAVENOUS | Status: DC
  Administered 2019-06-04: 10 meq via INTRAVENOUS
  Filled 2019-06-03 (×2): qty 100

## 2019-06-03 NOTE — ED Notes (Signed)
Pt ambulated to the room with no difficulites

## 2019-06-03 NOTE — ED Notes (Signed)
After completing triage, pt states he is having trouble walking and has right leg tingling/numbness. Pt continues to not display any neuro deficits,  Poor historian on when he last felt good "after lunch".  SPoke to Dr. Gilford Raid who agreed and asked that RN place CT order in the computer.

## 2019-06-03 NOTE — ED Notes (Signed)
The pt is c/o all over body cramps  Especially in his rt leg  Difficulty walking since this am  Alert oriented skin warm and dry no distress

## 2019-06-03 NOTE — ED Notes (Signed)
Pt going to mri ivs and meds held until he returns

## 2019-06-03 NOTE — ED Triage Notes (Signed)
Pt states he was walking into the house and got very weak and dizzy.  Pt states that he thought he was dehydrated but after water he continues to have dizzy/weak spells.  No neuro deficits in triage.

## 2019-06-03 NOTE — ED Notes (Signed)
Notified Dr. Gilford Raid of critical lab

## 2019-06-04 ENCOUNTER — Emergency Department (HOSPITAL_COMMUNITY): Payer: Medicare HMO

## 2019-06-04 ENCOUNTER — Observation Stay (HOSPITAL_COMMUNITY): Payer: Medicare HMO

## 2019-06-04 ENCOUNTER — Other Ambulatory Visit (HOSPITAL_COMMUNITY): Payer: Medicare HMO

## 2019-06-04 ENCOUNTER — Encounter (HOSPITAL_COMMUNITY): Payer: Self-pay | Admitting: Family Medicine

## 2019-06-04 DIAGNOSIS — I639 Cerebral infarction, unspecified: Secondary | ICD-10-CM | POA: Diagnosis present

## 2019-06-04 DIAGNOSIS — I10 Essential (primary) hypertension: Secondary | ICD-10-CM | POA: Diagnosis not present

## 2019-06-04 DIAGNOSIS — E876 Hypokalemia: Secondary | ICD-10-CM | POA: Diagnosis present

## 2019-06-04 LAB — RAPID URINE DRUG SCREEN, HOSP PERFORMED
Amphetamines: NOT DETECTED
Barbiturates: NOT DETECTED
Benzodiazepines: NOT DETECTED
Cocaine: NOT DETECTED
Opiates: NOT DETECTED
Tetrahydrocannabinol: NOT DETECTED

## 2019-06-04 LAB — LIPID PANEL
Cholesterol: 111 mg/dL (ref 0–200)
HDL: 34 mg/dL — ABNORMAL LOW (ref >40)
LDL Cholesterol: 60 mg/dL (ref 0–99)
Total CHOL/HDL Ratio: 3.3 RATIO
Triglycerides: 86 mg/dL (ref <150)
VLDL: 17 mg/dL (ref 0–40)

## 2019-06-04 LAB — CBC
HCT: 40.1 % (ref 39.0–52.0)
Hemoglobin: 14.4 g/dL (ref 13.0–17.0)
MCH: 32.4 pg (ref 26.0–34.0)
MCHC: 35.9 g/dL (ref 30.0–36.0)
MCV: 90.1 fL (ref 80.0–100.0)
Platelets: 196 10*3/uL (ref 150–400)
RBC: 4.45 MIL/uL (ref 4.22–5.81)
RDW: 12.6 % (ref 11.5–15.5)
WBC: 7.8 10*3/uL (ref 4.0–10.5)
nRBC: 0 % (ref 0.0–0.2)

## 2019-06-04 LAB — CREATININE, URINE, RANDOM: Creatinine, Urine: 29.13 mg/dL

## 2019-06-04 LAB — HEMOGLOBIN A1C
Hgb A1c MFr Bld: 5.6 % (ref 4.8–5.6)
Mean Plasma Glucose: 114.02 mg/dL

## 2019-06-04 LAB — MAGNESIUM: Magnesium: 2.2 mg/dL (ref 1.7–2.4)

## 2019-06-04 LAB — BRAIN NATRIURETIC PEPTIDE: B Natriuretic Peptide: 26.4 pg/mL (ref 0.0–100.0)

## 2019-06-04 LAB — NA AND K (SODIUM & POTASSIUM), RAND UR
Potassium Urine: 5 mmol/L
Sodium, Ur: 24 mmol/L

## 2019-06-04 LAB — GLUCOSE, CAPILLARY: Glucose-Capillary: 100 mg/dL — ABNORMAL HIGH (ref 70–99)

## 2019-06-04 MED ORDER — ASPIRIN 325 MG PO TABS
325.0000 mg | ORAL_TABLET | Freq: Once | ORAL | Status: AC
  Administered 2019-06-04: 325 mg via ORAL
  Filled 2019-06-04: qty 1

## 2019-06-04 MED ORDER — IOHEXOL 350 MG/ML SOLN
75.0000 mL | Freq: Once | INTRAVENOUS | Status: AC | PRN
  Administered 2019-06-04: 04:00:00 75 mL via INTRAVENOUS

## 2019-06-04 MED ORDER — SODIUM CHLORIDE 0.9 % IV SOLN
INTRAVENOUS | Status: DC
  Administered 2019-06-04: 07:00:00 via INTRAVENOUS

## 2019-06-04 MED ORDER — ACETAMINOPHEN 160 MG/5ML PO SOLN: 650.0000 mg | ORAL | Status: DC | PRN

## 2019-06-04 MED ORDER — MAGNESIUM SULFATE IN D5W 1-5 GM/100ML-% IV SOLN
1.0000 g | Freq: Once | INTRAVENOUS | Status: AC
  Administered 2019-06-04: 1 g via INTRAVENOUS
  Filled 2019-06-04: qty 100

## 2019-06-04 MED ORDER — ASPIRIN 300 MG RE SUPP: 300.0000 mg | Freq: Every day | RECTAL | Status: DC

## 2019-06-04 MED ORDER — ASPIRIN EC 81 MG PO TBEC: 81.0000 mg | DELAYED_RELEASE_TABLET | Freq: Every day | ORAL | Status: DC

## 2019-06-04 MED ORDER — POTASSIUM CHLORIDE 10 MEQ/100ML IV SOLN
10.0000 meq | Freq: Once | INTRAVENOUS | Status: AC
  Administered 2019-06-04: 10 meq via INTRAVENOUS
  Filled 2019-06-04: qty 100

## 2019-06-04 MED ORDER — ENOXAPARIN SODIUM 40 MG/0.4ML ~~LOC~~ SOLN
40.0000 mg | Freq: Every day | SUBCUTANEOUS | Status: DC
  Administered 2019-06-04: 40 mg via SUBCUTANEOUS
  Filled 2019-06-04: qty 0.4

## 2019-06-04 MED ORDER — ATORVASTATIN CALCIUM 10 MG PO TABS: 20.0000 mg | ORAL_TABLET | Freq: Every day | ORAL | Status: DC

## 2019-06-04 MED ORDER — ACETAMINOPHEN 650 MG RE SUPP: 650.0000 mg | RECTAL | Status: DC | PRN

## 2019-06-04 MED ORDER — SENNOSIDES-DOCUSATE SODIUM 8.6-50 MG PO TABS: 1.0000 | ORAL_TABLET | Freq: Every evening | ORAL | Status: DC | PRN

## 2019-06-04 MED ORDER — ASPIRIN 325 MG PO TABS: 325.0000 mg | ORAL_TABLET | Freq: Every day | ORAL | Status: DC

## 2019-06-04 MED ORDER — CLOPIDOGREL BISULFATE 75 MG PO TABS
75.0000 mg | ORAL_TABLET | Freq: Every day | ORAL | Status: DC
  Administered 2019-06-04: 75 mg via ORAL
  Filled 2019-06-04: qty 1

## 2019-06-04 MED ORDER — TAMSULOSIN HCL 0.4 MG PO CAPS
0.4000 mg | ORAL_CAPSULE | Freq: Every day | ORAL | Status: DC
  Administered 2019-06-04: 0.4 mg via ORAL
  Filled 2019-06-04: qty 1

## 2019-06-04 MED ORDER — POTASSIUM CHLORIDE 10 MEQ/100ML IV SOLN
10.0000 meq | INTRAVENOUS | Status: DC
  Administered 2019-06-04 (×2): 10 meq via INTRAVENOUS
  Filled 2019-06-04: qty 100

## 2019-06-04 MED ORDER — STROKE: EARLY STAGES OF RECOVERY BOOK
Freq: Once | Status: AC
  Administered 2019-06-04: 07:00:00
  Filled 2019-06-04: qty 1

## 2019-06-04 MED ORDER — ACETAMINOPHEN 325 MG PO TABS: 650.0000 mg | ORAL_TABLET | ORAL | Status: DC | PRN

## 2019-06-04 NOTE — Plan of Care (Signed)
  Problem: Education: Goal: Knowledge of disease or condition will improve Outcome: Progressing Goal: Knowledge of secondary prevention will improve Outcome: Progressing Goal: Knowledge of patient specific risk factors addressed and post discharge goals established will improve Outcome: Progressing Goal: Individualized Educational Video(s) Outcome: Progressing   

## 2019-06-04 NOTE — Discharge Summary (Signed)
Physician Discharge Summary  James Riley KDX:[id number] DOB: [birth year] DOA: 06/03/2019  PCP: [name], NP  Admit date: 06/03/2019 Discharge date: 06/04/2019  Admitted From: Home Disposition:  Left Against Medical Advice   Brief/Interim Summary: 68 y.o. male with medical history significant for hypertension, hyperlipidemia, and reports suffering a stroke 18 years ago, now presenting to the emergency department for evaluation of generalized weakness, lightheadedness, leg numbness and tingling, and leg cramps.  Patient is alert and oriented, pleasant and cooperative, but has difficulty providing a consistent history.  He now states that he developed generalized weakness and lightheadedness yesterday morning, was having some pain in the right hip and numbness and tingling in the left leg, and was having difficulty ambulating due to this.  He also has been experiencing muscle cramps for at least a couple days now.  He denies any recent fall or trauma, denies change in vision or hearing, and he denies any facial numbness or weakness, or difficulty swallowing.  Denies fevers, chills, cough, shortness of breath, chest pain, diarrhea, or vomiting.  ED Course: Upon arrival to the ED, patient is found to be afebrile, saturating well on room air, and with normal blood pressure and heart rate.  EKG features sinus rhythm.  Noncontrast head CT is negative for acute intracranial abnormality.  CBC is unremarkable.  Urinalysis features a low specific gravity.  Chemistry panel is notable for a potassium of 2.6 and creatinine 1.26, similar to priors.  MRI brain is concerning for punctate acute/early subacute small vessel infarct within the right posterior frontal white matter.    Discharge Diagnoses:  Principal Problem:   Acute ischemic stroke [facility]) Active Problems:   Essential hypertension, benign   Hypokalemia   1. Ischemic CVA  - Presents with vague malaise including gen weakness and lightheadedness,  reported RLE numbness initially but later said the right hip was hurting but the left leg was numb, onset ~12 hrs prior to arrival  - ED workup notable for potassium of 2.6 and MRI brain with punctate acute or early subacute infarction within right posterior frontal white matter - Carotid US, echocardiogram were ordered, however were not performed as pt left against medical advice  - While in hospital, patient was continued on statin, ASA  - Neurology was consulted this visit - Patient was able oriented x 3, able to verbalize his medical condition, what the treatment was, and able to state the possible consequence of not being treated if he left AMA. Pt verbalized "I know I'll die. We're all going to die anyway"  2. Hypokalemia  - Serum potassium is 2.6 in ED with mag level 1.8  - Corrected. Pt refused repeat blood draw  3. Hypertension  - BP at goal  - Hold antihypertensives initially in acute phase of ischemic CVA     Discharge Instructions   Allergies as of 06/04/2019   No Known Allergies     Medication List    ASK your doctor about these medications   amLODipine 10 MG tablet Commonly known as: NORVASC Take 1 tablet (10 mg total) by mouth daily.   atorvastatin 20 MG tablet Commonly known as: LIPITOR Take 1 tablet (20 mg total) by mouth daily.   hydrochlorothiazide 25 MG tablet Commonly known as: HYDRODIURIL Take 1 tablet (25 mg total) by mouth daily.   losartan 50 MG tablet Commonly known as: COZAAR Take 1 tablet (50 mg total) by mouth daily.   sildenafil 100 MG tablet Commonly known as: Viagra Take 1  tablet (100 mg total) by mouth daily as needed for erectile dysfunction.   tamsulosin 0.4 MG Caps capsule Commonly known as: FLOMAX Take 1 capsule (0.4 mg total) by mouth daily. Patient will pick up scripts today.      Follow-up Information    [facility] .   Specialty: Radiology Contact information: [address] [id number] [place] [id number] [phone]       [facility] .   Specialty: Cardiology Contact information: [address] [id number] [place]  [place] [id number] [phone]       [facility]. Schedule an appointment as soon as possible for a visit in 4 week(s).   Specialty: Neurology Contact information: [address] [place] [id number] [phone]         No Known Allergies  Consultations:  Neurology  Procedures/Studies: Ct Angio Head W Or Wo Contrast  Result Date: 06/04/2019 CLINICAL DATA:  Stroke follow-up EXAM: CT ANGIOGRAPHY HEAD AND NECK TECHNIQUE: Multidetector CT imaging of the head and neck was performed using the standard protocol during bolus administration of intravenous contrast. Multiplanar CT image reconstructions and MIPs were obtained to evaluate the vascular anatomy. Carotid stenosis measurements (when applicable) are obtained utilizing NASCET criteria, using the distal internal carotid diameter as the denominator. CONTRAST:  75mL OMNIPAQUE IOHEXOL 350 MG/ML SOLN COMPARISON:  Brain MRI from earlier today FINDINGS: CTA NECK FINDINGS Aortic arch: 2 vessel branching. Right carotid system: Vessels are smooth and diffusely patent. No atheromatous calcification. Left carotid system: Mild atheromatous wall thickening of the common carotid and at the ICA bulb. No flow limiting stenosis or ulceration. Vertebral arteries: No proximal subclavian stenosis. Mild to moderate narrowing at the origin of the left vertebral artery due to low-density plaque. No flow limiting stenosis seen throughout the vertebral arteries. Skeleton: No acute or aggressive finding. Left simple lipoma associated with the left trapezius. Other neck: Negative.  No incidental mass or inflammation. Upper chest: Negative Review of the MIP images confirms the above findings CTA HEAD FINDINGS  Anterior circulation: Atheromatous plaque on the carotid siphons. Left ICA is larger than the right due to right A1 hypoplasia. Negative for branch occlusion or flow limiting stenosis. Conical outpouching from the supraclinoid left ICA directed medially and inferiorly measuring 2 mm in maximum. Posterior circulation: Codominant vertebral arteries. The vertebrobasilar arteries are smooth and widely patent. No branch occlusion, aneurysm, or flow limiting stenosis. Atheromatous irregularity of the proximal PCAs. Venous sinuses: Patent Anatomic variants: None significant Delayed phase: Not obtained Review of the MIP images confirms the above findings IMPRESSION: 1. No emergent vascular finding. There is atherosclerosis without flow limiting stenosis of major intracranial or cervical vessels. 2. Mild-to-moderate left vertebral origin stenosis. 3. 2 mm outpouching from the supraclinoid left ICA, shape favoring infundibulum over aneurysm. Electronically Signed   By: Marnee SpringJonathon  Watts M.D.   On: 06/04/2019 04:40   Ct Head Wo Contrast  Result Date: 06/03/2019 CLINICAL DATA:  Dizziness EXAM: CT HEAD WITHOUT CONTRAST TECHNIQUE: Contiguous axial images were obtained from the base of the skull through the vertex without intravenous contrast. COMPARISON:  01/03/2010 FINDINGS: Brain: Mild atrophic changes are noted. No findings to suggest acute hemorrhage, acute infarction or space-occupying mass lesion are noted. Vascular: No hyperdense vessel or unexpected calcification. Skull: Normal. Negative for fracture or focal lesion. Sinuses/Orbits: No acute finding. Other: None. IMPRESSION: Mild atrophic changes without acute abnormality. Electronically Signed   By: Alcide CleverMark  Lukens  M.D.   On: 06/03/2019 21:58   Ct Angio Neck W Or Wo Contrast  Result Date: 06/04/2019 CLINICAL DATA:  Stroke follow-up EXAM: CT ANGIOGRAPHY HEAD AND NECK TECHNIQUE: Multidetector CT imaging of the head and neck was performed using the standard protocol during  bolus administration of intravenous contrast. Multiplanar CT image reconstructions and MIPs were obtained to evaluate the vascular anatomy. Carotid stenosis measurements (when applicable) are obtained utilizing NASCET criteria, using the distal internal carotid diameter as the denominator. CONTRAST:  75mL OMNIPAQUE IOHEXOL 350 MG/ML SOLN COMPARISON:  Brain MRI from earlier today FINDINGS: CTA NECK FINDINGS Aortic arch: 2 vessel branching. Right carotid system: Vessels are smooth and diffusely patent. No atheromatous calcification. Left carotid system: Mild atheromatous wall thickening of the common carotid and at the ICA bulb. No flow limiting stenosis or ulceration. Vertebral arteries: No proximal subclavian stenosis. Mild to moderate narrowing at the origin of the left vertebral artery due to low-density plaque. No flow limiting stenosis seen throughout the vertebral arteries. Skeleton: No acute or aggressive finding. Left simple lipoma associated with the left trapezius. Other neck: Negative.  No incidental mass or inflammation. Upper chest: Negative Review of the MIP images confirms the above findings CTA HEAD FINDINGS Anterior circulation: Atheromatous plaque on the carotid siphons. Left ICA is larger than the right due to right A1 hypoplasia. Negative for branch occlusion or flow limiting stenosis. Conical outpouching from the supraclinoid left ICA directed medially and inferiorly measuring 2 mm in maximum. Posterior circulation: Codominant vertebral arteries. The vertebrobasilar arteries are smooth and widely patent. No branch occlusion, aneurysm, or flow limiting stenosis. Atheromatous irregularity of the proximal PCAs. Venous sinuses: Patent Anatomic variants: None significant Delayed phase: Not obtained Review of the MIP images confirms the above findings IMPRESSION: 1. No emergent vascular finding. There is atherosclerosis without flow limiting stenosis of major intracranial or cervical vessels. 2.  Mild-to-moderate left vertebral origin stenosis. 3. 2 mm outpouching from the supraclinoid left ICA, shape favoring infundibulum over aneurysm. Electronically Signed   By: Marnee SpringJonathon  Watts M.D.   On: 06/04/2019 04:40   Mr Brain Wo Contrast (neuro Protocol)  Result Date: 06/04/2019 CLINICAL DATA:  68 y/o  M; weakness and dizziness. EXAM: MRI HEAD WITHOUT CONTRAST TECHNIQUE: Multiplanar, multiecho pulse sequences of the brain and surrounding structures were obtained without intravenous contrast. COMPARISON:  06/03/2019 CT head.  01/04/2010 MRI head. FINDINGS: Brain: Punctate focus of reduced diffusion within right posterior frontal white matter compatible with acute/early subacute infarction (series 5, image 83). No hemorrhage or mass effect. No extra-axial collection, hydrocephalus, mass effect, or herniation. Small chronic infarcts are present within genu of corpus callosum, bilateral thalami, left hemi pons, bilateral cerebellar hemispheres. Scattered punctate nonspecific T2 FLAIR hyperintensities in subcortical and periventricular white matter are compatible with mild chronic microvascular ischemic changes. Mild volume loss of the brain. Vascular: Normal flow voids. Skull and upper cervical spine: Normal marrow signal. Sinuses/Orbits: Negative. Other: None. IMPRESSION: 1. Punctate acute/early subacute small vessel infarction within the right posterior frontal white matter. No hemorrhage or mass effect. 2. Stable chronic microvascular ischemic changes, volume loss, and small chronic infarcts of the brain. These results were called by telephone at the time of interpretation on 06/04/2019 at 12:44 am to Dr. Judd Lienelo, who verbally acknowledged these results. Electronically Signed   By: Mitzi HansenLance  Furusawa-Stratton M.D.   On: 06/04/2019 00:47     Subjective: Wants to leave AMA  Discharge Exam: Vitals:   06/04/19 0850 06/04/19 1212  BP: (!) 145/70 Marland Kitchen(!)  132/58  Pulse: 75 75  Resp: 16 12  Temp: 97.9 F (36.6 C) (!)  97.3 F (36.3 C)  SpO2: 94% 99%   Vitals:   06/04/19 0345 06/04/19 0444 06/04/19 0850 06/04/19 1212  BP: 118/75 122/73 (!) 145/70 (!) 132/58  Pulse: 83 81 75 75  Resp:  Temp:  98.5 F (36.9 C) 97.9 F (36.6 C) (!) 97.3 F (36.3 C)  TempSrc:  Oral Oral Oral  SpO2: 98% 99% 94% 99%  Weight:  83.7 kg    Height:   (1.778 m)      General: Pt is alert, awake, not in acute distress Cardiovascular: RRR, S1/S2 +, no rubs, no gallops Respiratory: CTA bilaterally, no wheezing, no rhonchi Abdominal: Soft, NT, ND, bowel sounds + Extremities: no edema, no cyanosis   The results of significant diagnostics from this hospitalization (including imaging, microbiology, ancillary and laboratory) are listed below for reference.     Microbiology: No results found for this or any previous visit (from the past 240 hour(s)).   Labs: BNP (last 3 results) Recent Labs    06/04/19 0949  BNP 26.4   Basic Metabolic Panel: Recent Labs  Lab 06/02/19 0956 06/03/19 2005 06/03/19 2019 06/04/19 0949  NA 138 130* 131*  --   K 3.5 2.6* 2.7*  --   CL 94* 93* 93*  --   CO2 24 25  --   --   GLUCOSE 96 105* 103*  --   BUN --   CREATININE 1.34* 1.26* 1.20  --   CALCIUM 10.3* 9.3  --   --   MG 2.4* 1.8  --  2.2   Liver Function Tests: No results for input(s): AST, ALT, ALKPHOS, BILITOT, PROT, ALBUMIN in the last 168 hours. No results for input(s): LIPASE, AMYLASE in the last 168 hours. No results for input(s): AMMONIA in the last 168 hours. CBC: Recent Labs  Lab 06/03/19 2005 06/03/19 2019 06/04/19 0949  WBC 9.0  --  7.8  NEUTROABS 5.5  --   --   HGB 14.9 15.0 14.4  HCT 40.9 44.0 40.1  MCV 89.9  --  90.1  PLT 200  --  196   Cardiac Enzymes: No results for input(s): CKTOTAL, CKMB, CKMBINDEX, TROPONINI in the last 168 hours. BNP: Invalid input(s): POCBNP CBG: Recent Labs  Lab 06/04/19 0737  GLUCAP 100*   D-Dimer No results for input(s): DDIMER in the last  72 hours. Hgb A1c Recent Labs    06/04/19 0949  HGBA1C 5.6   Lipid Profile Recent Labs    06/04/19 0949  CHOL 111  HDL 34*  LDLCALC 60  TRIG 86  CHOLHDL 3.3   Thyroid function studies No results for input(s): TSH, T4TOTAL, T3FREE, THYROIDAB in the last 72 hours.  Invalid input(s): FREET3 Anemia work up No results for input(s): VITAMINB12, FOLATE, FERRITIN, TIBC, IRON, RETICCTPCT in the last 72 hours. Urinalysis    Component Value Date/Time   COLORURINE COLORLESS (A) 06/03/2019 2025   APPEARANCEUR CLEAR 06/03/2019 2025   LABSPEC 1.002 (L) 06/03/2019 2025   PHURINE 5.0 06/03/2019 2025   GLUCOSEU NEGATIVE 06/03/2019 2025   HGBUR NEGATIVE 06/03/2019 2025   HGBUR negative 02/05/2011 1028   BILIRUBINUR NEGATIVE 06/03/2019 2025   BILIRUBINUR negative 06/02/2019 0929   KETONESUR NEGATIVE 06/03/2019 2025   PROTEINUR NEGATIVE 06/03/2019 2025   UROBILINOGEN 0.2 06/02/2019 0929   UROBILINOGEN 0.2 09/06/2015 1028   NITRITE NEGATIVE 06/03/2019 2025  LEUKOCYTESUR NEGATIVE 06/03/2019 2025   Sepsis Labs Invalid input(s): PROCALCITONIN,  WBC,  LACTICIDVEN Microbiology No results found for this or any previous visit (from the past 240 hour(s)).   SIGNED:   Rickey BarbaraStephen Jhair Witherington, MD  [facility] 06/04/2019, 6:29 PM  If 7PM-7AM, please contact night-coverage

## 2019-06-04 NOTE — Evaluation (Signed)
Physical Therapy Evaluation Patient Details Name: James Riley MRN: [id number] DOB: [birth year] Today's Date: 06/04/2019   History of Present Illness  68 y.o. male admitted on 06/03/19 for weakness, lightheadedness, numbness, leg cramps.  Pt dx with hypokalemia and acute stroke (via MRI) in the R posterior frontal white matter.  Pt with significant PMH of stroke (with residual R sided weakness in 1989), HTN.    Clinical Impression  Pt is mildly unsteady on his feet requiring min guard assist for gait.  He was no better with cane and I don't feel he is bad enough for RW level of stability.  He would benefit from some post acute home therapy for balance and gait training.   PT to follow acutely for deficits listed below.      Follow Up Recommendations Home health PT;Supervision - Intermittent    Equipment Recommendations  None recommended by PT    Recommendations for Other Services   NA    Precautions / Restrictions Precautions Precautions: Fall Precaution Comments: mildly unsteady on his feet, R LE functional weakness      Mobility  Bed Mobility               General bed mobility comments: Pt was OOB in the recliner chair.   Transfers Overall transfer level: Modified independent                  Ambulation/Gait Ambulation/Gait assistance: Min guard Gait Distance (Feet): 200 Feet Assistive device: None;Straight cane Gait Pattern/deviations: Decreased step length - right;Decreased dorsiflexion - right     General Gait Details: Pt walk without AD and then we tried the cane, but he looked no better with the cane as he carried it more than actually using it for stability despite cues for technique.    Stairs Stairs: Yes Stairs assistance: Supervision Stair Management: Two rails;Alternating pattern;Forwards Number of Stairs: 5 General stair comments: Pt was easily able to ascend and descent steps with support of railing using alternating pattern without signs of  buckling on the weak R side.   Wheelchair Mobility    Modified Inglett (Stroke Patients Only) Modified Eppes (Stroke Patients Only) Pre-Morbid Brissett Score: Slight disability Modified Doering: Moderately severe disability     Balance Overall balance assessment: Needs assistance Sitting-balance support: Feet supported;No upper extremity supported Sitting balance-Leahy Scale: Good     Standing balance support: No upper extremity supported Standing balance-Leahy Scale: Good                               Pertinent Vitals/Pain Pain Assessment: No/denies pain    Home Living Family/patient expects to be discharged to:: Private residence Living Arrangements: Alone Available Help at Discharge: Family;Available PRN/intermittently(son lives in [place]) Type of Home: Apartment Home Access: Stairs to enter Entrance Stairs-Rails: Right Entrance Stairs-Number of Steps: 2 Home Layout: One level Home Equipment: Cane - single point      Prior Function Level of Independence: Independent         Comments: drives, is retired, likes to walk the parking lot with "the old floks"     Hand Dominance   Dominant Hand: Right    Extremity/Trunk Assessment   Upper Extremity Assessment Upper Extremity Assessment: Defer to OT evaluation    Lower Extremity Assessment Lower Extremity Assessment: RLE deficits/detail RLE Deficits / Details: MMT seated is 5/5, but functionally he has some decreased step length on R, a bit of foot drag,  it is just not as brisk to progress forward and has some give at the R knee as he fatigues.  He reports it often give out.  RLE Sensation: WNL    Cervical / Trunk Assessment Cervical / Trunk Assessment: Normal  Communication   Communication: HOH  Cognition Arousal/Alertness: Awake/alert Behavior During Therapy: WFL for tasks assessed/performed Overall Cognitive Status: No family/caregiver present to determine baseline cognitive functioning                                  General Comments: Pt seems to be a shade HOH vs some mild cognitive deficits.  He is a bit tangental, but can be brought back to task with repetition (making me think he is HOH vs pure cognitive).  Needs further assessment.              Assessment/Plan    PT Assessment Patient needs continued PT services  PT Problem List Decreased strength;Decreased activity tolerance;Decreased balance;Decreased mobility;Decreased cognition;Decreased knowledge of use of DME;Decreased safety awareness;Decreased knowledge of precautions       PT Treatment Interventions DME instruction;Gait training;Stair training;Functional mobility training;Therapeutic activities;Therapeutic exercise;Balance training;Cognitive remediation;Neuromuscular re-education;Patient/family education    PT Goals (Current goals can be found in the Care Plan section)  Acute Rehab PT Goals Patient Stated Goal: to go home today if he can PT Goal Formulation: With patient Time For Goal Achievement: 06/18/19 Potential to Achieve Goals: Good    Frequency Min 4X/week           AM-PAC PT "6 Clicks" Mobility  Outcome Measure Help needed turning from your back to your side while in a flat bed without using bedrails?: None Help needed moving from lying on your back to sitting on the side of a flat bed without using bedrails?: None Help needed moving to and from a bed to a chair (including a wheelchair)?: None Help needed standing up from a chair using your arms (e.g., wheelchair or bedside chair)?: None Help needed to walk in hospital room?: A Little Help needed climbing 3-5 steps with a railing? : None 6 Click Score: 23    End of Session   Activity Tolerance: Patient limited by fatigue Patient left: in chair;with call bell/phone within reach   PT Visit Diagnosis: Muscle weakness (generalized) (M62.81);Difficulty in walking, not elsewhere classified (R26.2);Other symptoms and signs  involving the nervous system (Z61.096(R29.898)    Time: [id number] PT Time Calculation (min) (ACUTE ONLY): 11 min   Charges:        Lurena Joinerebecca B. Malichi Palardy, PT, DPT  Acute Rehabilitation [phone] pager [phone] office  @ [facility] [place]: [phone]   PT Evaluation $PT Eval Moderate Complexity: 1 Mod         06/04/2019, 11:31 AM

## 2019-06-04 NOTE — Consult Note (Addendum)
Neurology Consultation  Reason for Consult: Dizziness, generalized weakness Referring Physician: T. Opyd MD CC: Difficulty walking, dizziness, generalized weakness  History is obtained from: Patient, chart  HPI: James Riley is a 68 y.o. male past medical history of hypertension, prediabetes, hyperlipidemia, left pontine stroke with some residual minimal right-sided weakness since 2011-which he says nearly resolved after therapy, presenting to the emergency room for evaluation of a multitude of complaints that include feeling of dizziness, difficulty walking and generalized weakness and cramping in his legs. When asked as to when to the last feel completely normal, the best response received from him as he felt good after lunch on 06/03/2019. After that, he started to have generalized weakness and dizziness-that he describes as lightheadedness.  He denies any room spinning or vertiginous kind of feeling.  He says that over the past 2 to 3 days, he has been having cramping in his legs when he tries to walk which has made walking difficult.  No diarrhea or vomiting.  No history of focal tingling numbness or weakness. Due to his multitude of complaints, a brain MRI was done that shows a small punctate area of restricted diffusion in the right posterior frontal lobe.  Neurological consultation was obtained for the stroke and current presentation.  In the emergency room, other than the MRI, his labs were drawn.  He had hypokalemia, hyponatremia and mild creatinine elevation which is near about his baseline at 1.26. He has been admitted to the hospitalist service for the management of multiple electrolyte abnormalities and also for the punctate stroke seen on MRI.    LKW: 12 PM on 06/03/2019 tpa given?: no, outside the window Premorbid modified Divirgilio scale (mRS): 1  ROS: ROS was performed and is negative except as noted in the HPI.   Past Medical History:  Diagnosis Date  . Hypercholesterolemia    . Hypertension   . Stroke [facility](HCC) ~ 1989   "little weak on my right side since" (09/10/2016)    Family History  Problem Relation Age of Onset  . Diabetes Neg Hx   . Hypertension Neg Hx    Social History:   reports that he has been smoking cigarettes. He has a 100.00 pack-year smoking history. He has never used smokeless tobacco. He reports current alcohol use of about 1.0 standard drinks of alcohol per week. He reports that he does not use drugs. Smokes 1 pack and 1/2/day of cigarettes.  Denies alcohol or illicit drug use.  Medications  Current Facility-Administered Medications:  .  magnesium sulfate IVPB 1 g 100 mL, 1 g, Intravenous, Once, Opyd, Timothy S, MD .  potassium chloride 10 mEq in 100 mL IVPB, 10 mEq, Intravenous, Q1 Hr x 2, Opyd, Lavone Neriimothy S, MD, Stopped at 06/04/19 0251 .  sodium chloride flush (NS) 0.9 % injection 3 mL, 3 mL, Intravenous, Once, Geoffery Lyonselo, Douglas, MD  Current Outpatient Medications:  .  amLODipine (NORVASC) 10 MG tablet, Take 1 tablet (10 mg total) by mouth daily., Disp: 90 tablet, Rfl: 1 .  atorvastatin (LIPITOR) 20 MG tablet, Take 1 tablet (20 mg total) by mouth daily., Disp: 90 tablet, Rfl: 2 .  hydrochlorothiazide (HYDRODIURIL) 25 MG tablet, Take 1 tablet (25 mg total) by mouth daily., Disp: 90 tablet, Rfl: 1 .  losartan (COZAAR) 50 MG tablet, Take 1 tablet (50 mg total) by mouth daily., Disp: 90 tablet, Rfl: 6 .  sildenafil (VIAGRA) 100 MG tablet, Take 1 tablet (100 mg total) by mouth daily as needed for erectile  dysfunction., Disp: 10 tablet, Rfl: 3 .  tamsulosin (FLOMAX) 0.4 MG CAPS capsule, Take 1 capsule (0.4 mg total) by mouth daily. Patient will pick up scripts today., Disp: 30 capsule, Rfl: 3  Exam: Current vital signs: BP 122/67   Pulse 72   Temp 98.6 F (37 C) (Oral)   Resp 20   SpO2 99%  Vital signs in last 24 hours: Temp:  [98.6 F (37 C)] 98.6 F (37 C) (07/01 2006) Pulse Rate:  [69-76] 72 (07/01 2315) Resp:  [15-20] 20 (07/01 2315)  BP: (108-122)/(56-67) 122/67 (07/01 2315) SpO2:  [96 %-100 %] 99 % (07/01 2315) General examination: Patient is awake alert comfortably sitting in bed and eating his sandwich at the time of this encounter. HEENT: Normocephalic atraumatic dry oral mucous membranes CVS: Regular rate rhythm Lungs: Breathing normally and saturating well at room air Extremities: Warm well perfused Abdomen: Nondistended nontender Neurological exam Awake alert oriented x3 Mildly reduced attention concentration No aphasia or dysarthria Cranial nerves: Pupils equal round react light, extraocular movements intact, visual fields full, facial sensation intact, face symmetric, palate midline, tongue midline. Motor exam: No drift in any of the 4 extremities but right lower extremities 4+/5 whereas all the other extremities are 5/5. Sensory exam: Decreased to light touch in the right leg compared to the left. Coordination: Intact finger-nose-finger testing. Gait testing: Deferred at this time but according to the EDP and RN, he was having NIHSS - 1   Labs I have reviewed labs in epic and the results pertinent to this consultation are: Hypokalemia, hyponatremia, creatinine 1.2 CBC    Component Value Date/Time   WBC 9.0 06/03/2019 2005   RBC 4.55 06/03/2019 2005   HGB 15.0 06/03/2019 2019   HGB 17.2 07/07/2018 1207   HCT 44.0 06/03/2019 2019   HCT 46.5 07/07/2018 1207   PLT 200 06/03/2019 2005   PLT 212 07/07/2018 1207   MCV 89.9 06/03/2019 2005   MCV 90 07/07/2018 1207   MCH 32.7 06/03/2019 2005   MCHC 36.4 (H) 06/03/2019 2005   RDW 12.6 06/03/2019 2005   RDW 14.6 07/07/2018 1207   LYMPHSABS 2.5 06/03/2019 2005   MONOABS 0.7 06/03/2019 2005   EOSABS 0.1 06/03/2019 2005   BASOSABS 0.1 06/03/2019 2005    CMP     Component Value Date/Time   NA 131 (L) 06/03/2019 2019   NA 138 06/02/2019 0956   K 2.7 (LL) 06/03/2019 2019   CL 93 (L) 06/03/2019 2019   CO2 25 06/03/2019 2005   GLUCOSE 103 (H)  06/03/2019 2019   BUN 12 06/03/2019 2019   BUN 14 06/02/2019 0956   CREATININE 1.20 06/03/2019 2019   CALCIUM 9.3 06/03/2019 2005   PROT 7.8 01/06/2019 0947   ALBUMIN 4.8 01/06/2019 0947   AST 22 01/06/2019 0947   ALT 21 01/06/2019 0947   ALKPHOS 95 01/06/2019 0947   BILITOT 0.5 01/06/2019 0947   GFRNONAA 58 (L) 06/03/2019 2005   GFRAA >60 06/03/2019 2005   Imaging I have reviewed the images obtained: MRI examination of the brain shows a punctate area of restricted diffusion in the right posterior frontal white matter.  Chronic microvascular changes.   Assessment: 68 year old man presented to the emergency room for evaluation of a multitude of complaints including dizziness, generalized weakness, difficulty walking and leg cramping, with some electrolyte abnormalities such as hyponatremia and hypokalemia, had a brain MRI done for further evaluation of the symptoms and was noted to have a small punctate stroke in  the right posterior frontal white matter. I am not sure if this stroke can explain any of his current findings as his weakness is on the right side, which is residual from his old left pontine stroke in 2011. The cramping that he is describing-might be related to the hypokalemia and the other electrolyte abnormalities. He would benefit from a stroke work-up because he is had a stroke in the past that was likely related to small vessel etiology and this stroke could either be a small vessel etiology stroke or could be a cardioembolic versus atheroembolic stroke. His medication list does not include any antiplatelets.  Impression:  Acute ischemic stroke Hypokalemia Hyponatremia Hypertension Prediabetes Recrudescence of old stroke symptoms in the setting of electrolyte abnormalities  Recommendations: Admit to hospitalist Telemetry CTA head and neck 2D echo Fasting lipid panel A1c Aspirin 325 now and daily Atorvastatin 80 now and daily Physical therapy Occupational  Therapy Speech therapy Stroke swallow evaluation- passed- card modified heart healthy diet. I had a lengthy discussion with him regarding the importance of smoking cessation given that he has had now 2 strokes which are likely small vessel etiology.  He verbalized understanding and would benefit from outpatient primary care continuing efforts with smoking cessation. Stroke team to follow   -- Amie Portland, MD [facility] Pager: [phone] If 7pm to 7am, please call on call as listed on AMION.

## 2019-06-04 NOTE — Progress Notes (Signed)
Pt got himself dressed, took tele off, ripped out IV and got on the elevator to leave. RN stopped the Pt and explained to him that he had not been discharged. RN also explained to the pt the importance of him staying and finishing his test and also of the safety concern. PT stated that he still wanted to leave, and that on one was going to make him stay. PT signed AMA papers, MD notified.

## 2019-06-04 NOTE — ED Notes (Signed)
Sandwich given 

## 2019-06-04 NOTE — ED Notes (Signed)
Report given to rn on 3  w 

## 2019-06-04 NOTE — ED Notes (Signed)
Pt passed the swallow screen 

## 2019-06-04 NOTE — ED Notes (Signed)
Back to c-t 

## 2019-06-04 NOTE — ED Notes (Signed)
Dr Shona Needles at  The bedside

## 2019-06-04 NOTE — Evaluation (Signed)
Occupational Therapy Evaluation Patient Details Name: James GaussRonald W Riley MRN: [id number] DOB: 05/26/1951 Today's Date: 06/04/2019    History of Present Illness 68 y.o. male admitted on 06/03/19 for weakness, lightheadedness, numbness, leg cramps.  Pt dx with hypokalemia and acute stroke (via MRI) in the R posterior frontal white matter.  Pt with significant PMH of stroke (with residual R sided weakness in 1989), HTN.     Clinical Impression   Pt PTA: living alone and independent. Pt currently performing ADL functional mobility with no AD and fair balance with modified independence. Pt performing UB ADL at sink in standing and donning/doffing own shoes with modified independence.  Pt with no focal deficits at this time. Pt with minor R side weakness 4-/5 MM grade in RUE. Pt tolerating session well and ambulating in hallway with IV pole. Pt with good visual scanning, A/O x4 and able to follow all commands. Pt would greatly benefit from continued OT skilled services for ADL, mobility and [facility] for safety and higher level ADL/IADL tasks. OT signing off.      Follow Up Recommendations  Home health OT;Supervision - Intermittent    Equipment Recommendations  None recommended by OT    Recommendations for Other Services       Precautions / Restrictions Precautions Precautions: Fall Precaution Comments: mildly unsteady on his feet, R LE functional weakness      Mobility Bed Mobility               General bed mobility comments: Pt was OOB in the recliner chair.   Transfers Overall transfer level: Modified independent               General transfer comment: no physical assist required.    Balance Overall balance assessment: Needs assistance Sitting-balance support: Feet supported;No upper extremity supported Sitting balance-Leahy Scale: Good     Standing balance support: No upper extremity supported Standing balance-Leahy Scale: Good                              ADL either performed or assessed with clinical judgement   ADL Overall ADL's : At baseline                                       General ADL Comments: pt performing UB ADL with modified independence and LB ADL mostly in seated position with reminders to tie shoes priro to ambulation.     Vision Baseline Vision/History: No visual deficits Vision Assessment?: No apparent visual deficits     Perception     Praxis      Pertinent Vitals/Pain Pain Assessment: No/denies pain     Hand Dominance Right   Extremity/Trunk Assessment Upper Extremity Assessment Upper Extremity Assessment: Overall WFL for tasks assessed;RUE deficits/detail RUE Deficits / Details: residual minor deficits 4/5 MM grade RUE Coordination: WNL   Lower Extremity Assessment Lower Extremity Assessment: Defer to PT evaluation RLE Deficits / Details: MMT seated is 5/5, but functionally he has some decreased step length on R, a bit of foot drag, it is just not as brisk to progress forward and has some give at the R knee as he fatigues.  He reports it often give out.  RLE Sensation: WNL   Cervical / Trunk Assessment Cervical / Trunk Assessment: Normal   Communication Communication Communication: HOH   Cognition  Arousal/Alertness: Awake/alert Behavior During Therapy: WFL for tasks assessed/performed Overall Cognitive Status: No family/caregiver present to determine baseline cognitive functioning                                 General Comments: Pt seems to be a shade HOH vs some mild cognitive deficits.  He is a bit tangental, but can be brought back to task with repetition (making me think he is HOH vs pure cognitive).  Needs further assessment.    General Comments  Pt slightly impulsive and would benefit from continued OT skilled therapy in [place] setting.     Exercises     Shoulder Instructions      Home Living Family/patient expects to be discharged to::  Private residence Living Arrangements: Alone Available Help at Discharge: Family;Available PRN/intermittently(son lives in [place]) Type of Home: Apartment Home Access: Stairs to enter [employer] of Steps: 2 Entrance Stairs-Rails: Right Home Layout: One level     Bathroom Shower/Tub: Teacher, early years/pre: Standard     Home Equipment: [employer] - single point          Prior Functioning/Environment Level of Independence: Independent        Comments: drives, is retired, likes to walk the parking lot with "the old floks"        OT Problem List: Decreased coordination;Decreased safety awareness      OT Treatment/Interventions:      OT Goals(Current goals can be found in the care plan section) Acute Rehab OT Goals Patient Stated Goal: to go home today if he can OT Goal Formulation: With patient Time For Goal Achievement: 06/18/19 Potential to Achieve Goals: Good  OT Frequency:     Barriers to D/C:            Co-evaluation              AM-PAC OT "6 Clicks" Daily Activity     Outcome Measure Help from another person eating meals?: None Help from another person taking care of personal grooming?: None Help from another person toileting, which includes using toliet, bedpan, or urinal?: None Help from another person bathing (including washing, rinsing, drying)?: A Little Help from another person to put on and taking off regular upper body clothing?: None Help from another person to put on and taking off regular lower body clothing?: None 6 Click Score: 23   End of Session Equipment Utilized During Treatment: Gait belt Nurse Communication: Mobility status  Activity Tolerance: Patient tolerated treatment well Patient left: in chair;with call bell/phone within reach  OT Visit Diagnosis: Unsteadiness on feet (R26.81);Muscle weakness (generalized) (M62.81)                Time: 6010-9323 OT Time Calculation (min): 24 min Charges:  OT  General Charges $OT Visit: 1 Visit OT Evaluation $OT Eval Moderate Complexity: 1 Mod OT Treatments $Self Care/Home Management : 8-22 mins  Ebony Hail Harold Hedge) Marsa Aris OTR/L Acute Rehabilitation Services Pager: [phone] Office: [place] 06/04/2019, 12:48 PM

## 2019-06-04 NOTE — Plan of Care (Signed)
  Problem: Self-Care: Goal: Ability to participate in self-care as condition permits will improve Outcome: Progressing   

## 2019-06-04 NOTE — ED Provider Notes (Signed)
[facility]ESSIVE [facility]ARE Provider Note   CSN: [id number] Arrival date & time: 06/03/19  1855     History   Chief Complaint Chief Complaint  Patient presents with   Weakness    [name] is a 68 y.o. male.     HPI Patient presents to the emergency department with weakness that started earlier today.  The patient states that he feels like is having trouble with his right leg mostly but does have cramping in both legs.  Patient also feels like he is having trouble with his right foot when he walks.  Patient's ability to give me a clear history is somewhat difficult.  Patient is alert and oriented but is has difficult time expressing his exact symptoms.  Patient states that he did not take any new medications or other medications prior to arrival.  The patient denies chest pain, shortness of breath, headache,blurred vision, neck pain, fever, cough,numbness, dizziness, anorexia, edema, abdominal pain, nausea, vomiting, diarrhea, rash, back pain, dysuria, hematemesis, bloody stool, near syncope, or syncope. Past Medical History:  Diagnosis Date   Hypercholesterolemia    Hypertension    Stroke [facility]) ~ 1989   "little weak on my right side since" (09/10/2016)    Patient Active Problem List   Diagnosis Date Noted   Acute ischemic stroke (HCC) 06/04/2019   Hypokalemia 06/04/2019   Hypertension 02/18/2017   Perforated appendicitis 09/07/2016   Diverticulitis of colon without hemorrhage 09/07/2015   Abscess of groin, right 09/18/2012   BLOOD IN STOOL 08/04/2010   ADHESIVE CAPSULITIS, RIGHT 07/19/2010   FOLLICULITIS 07/14/2010   SHOULDER PAIN, RIGHT 07/14/2010   INSOMNIA 07/14/2010   GLUCOSE INTOLERANCE 04/20/2010   FATIGUE 04/12/2010   GAIT IMBALANCE 04/12/2010   CEREBROVASCULAR ACCIDENT 01/31/2010   HYPERLIPIDEMIA 12/23/2009   TOBACCO ABUSE 12/23/2009   ERECTILE DYSFUNCTION, ORGANIC 12/23/2009   Essential hypertension, benign 12/09/2009     Past Surgical History:  Procedure Laterality Date   APPENDECTOMY     LAPAROSCOPIC APPENDECTOMY N/A 09/07/2016   Procedure: APPENDECTOMY LAPAROSCOPIC converted to open appendectomy;  Surgeon: Abigail Miyamoto, MD;  Location: [facility] OR;  Service: General;  Laterality: N/A;        Home Medications    Prior to Admission medications   Medication Sig Start Date End Date Taking? Authorizing Provider  amLODipine (NORVASC) 10 MG tablet Take 1 tablet (10 mg total) by mouth daily. 06/02/19  Yes Claiborne Rigg, NP  atorvastatin (LIPITOR) 20 MG tablet Take 1 tablet (20 mg total) by mouth daily. 06/02/19  Yes Claiborne Rigg, NP  hydrochlorothiazide (HYDRODIURIL) 25 MG tablet Take 1 tablet (25 mg total) by mouth daily. 06/02/19  Yes Claiborne Rigg, NP  losartan (COZAAR) 50 MG tablet Take 1 tablet (50 mg total) by mouth daily. 06/02/19 08/31/19 Yes Claiborne Rigg, NP  sildenafil (VIAGRA) 100 MG tablet Take 1 tablet (100 mg total) by mouth daily as needed for erectile dysfunction. 06/02/19  Yes Claiborne Rigg, NP  tamsulosin (FLOMAX) 0.4 MG CAPS capsule Take 1 capsule (0.4 mg total) by mouth daily. Patient will pick up scripts today. 06/02/19 07/02/19 Yes Claiborne Rigg, NP    Family History Family History  Problem Relation Age of Onset   Diabetes Neg Hx    Hypertension Neg Hx     Social History Social History   Tobacco Use   Smoking status: Current Every Day Smoker    Packs/day: 2.00    Years: 50.00    Pack  years: 100.00    Types: Cigarettes   Smokeless tobacco: Never Used  Substance Use Topics   Alcohol use: Yes    Alcohol/week: 1.0 standard drinks    Types: 1 Cans of beer per week    Comment: a beer once a week. As of July 2020 reports no alcohol at all   Drug use: No     Allergies   Patient has no known allergies.   Review of Systems Review of Systems  All other systems negative except as documented in the HPI. All pertinent positives and negatives as reviewed  in the HPI. Physical Exam Updated Vital Signs BP 122/73 (BP Location: Right Arm)    Pulse 81    Temp 98.5 F (36.9 C) (Oral)    Resp 18    Ht 5\' 10"  (1.778 m)    Wt 83.7 kg    SpO2 99%    BMI 26.48 kg/m   Physical Exam Vitals signs and nursing note reviewed.  Constitutional:      General: He is not in acute distress.    Appearance: He is well-developed.  HENT:     Head: Normocephalic and atraumatic.  Eyes:     Pupils: Pupils are equal, round, and reactive to light.  Neck:     Musculoskeletal: Normal range of motion and neck supple.  Cardiovascular:     Rate and Rhythm: Normal rate and regular rhythm.     Heart sounds: Normal heart sounds. No murmur. No friction rub. No gallop.   Pulmonary:     Effort: Pulmonary effort is normal. No respiratory distress.     Breath sounds: Normal breath sounds. No wheezing.  Abdominal:     General: Bowel sounds are normal. There is no distension.     Palpations: Abdomen is soft.     Tenderness: There is no abdominal tenderness.  Skin:    General: Skin is warm and dry.     Capillary Refill: Capillary refill takes less than 2 seconds.     Findings: No erythema or rash.  Neurological:     Mental Status: He is alert and oriented to person, place, and time.     Motor: No abnormal muscle tone.     Coordination: Coordination normal.  Psychiatric:        Behavior: Behavior normal.      ED Treatments / Results  Labs (all labs ordered are listed, but only abnormal results are displayed) Labs Reviewed  BASIC METABOLIC PANEL - Abnormal; Notable for the following components:      Result Value   Sodium 130 (*)    Potassium 2.6 (*)    Chloride 93 (*)    Glucose, Bld 105 (*)    Creatinine, Ser 1.26 (*)    GFR calc non Af Amer 58 (*)    All other components within normal limits  CBC - Abnormal; Notable for the following components:   MCHC 36.4 (*)    All other components within normal limits  URINALYSIS, ROUTINE W REFLEX MICROSCOPIC -  Abnormal; Notable for the following components:   Color, Urine COLORLESS (*)    Specific Gravity, Urine 1.002 (*)    All other components within normal limits  I-STAT CHEM 8, ED - Abnormal; Notable for the following components:   Sodium 131 (*)    Potassium 2.7 (*)    Chloride 93 (*)    Glucose, Bld 103 (*)    Calcium, Ion 1.12 (*)    All other components within normal  limits  NOVEL CORONAVIRUS, NAA (HOSPITAL ORDER, SEND-OUT TO REF LAB)  PROTIME-INR  APTT  DIFFERENTIAL  MAGNESIUM  CK  NA AND K (SODIUM & POTASSIUM), RAND UR  CREATININE, URINE, RANDOM  HIV ANTIBODY (ROUTINE TESTING W REFLEX)  HEMOGLOBIN A1C  LIPID PANEL  CBC  BASIC METABOLIC PANEL  MAGNESIUM  CBG MONITORING, ED  CBG MONITORING, ED    EKG None  Radiology Ct Angio Head W Or Wo Contrast  Result Date: 06/04/2019 CLINICAL DATA:  Stroke follow-up EXAM: CT ANGIOGRAPHY HEAD AND NECK TECHNIQUE: Multidetector CT imaging of the head and neck was performed using the standard protocol during bolus administration of intravenous contrast. Multiplanar CT image reconstructions and MIPs were obtained to evaluate the vascular anatomy. Carotid stenosis measurements (when applicable) are obtained utilizing NASCET criteria, using the distal internal carotid diameter as the denominator. CONTRAST:  75mL OMNIPAQUE IOHEXOL 350 MG/ML SOLN COMPARISON:  Brain MRI from earlier today FINDINGS: CTA NECK FINDINGS Aortic arch: 2 vessel branching. Right carotid system: Vessels are smooth and diffusely patent. No atheromatous calcification. Left carotid system: Mild atheromatous wall thickening of the common carotid and at the ICA bulb. No flow limiting stenosis or ulceration. Vertebral arteries: No proximal subclavian stenosis. Mild to moderate narrowing at the origin of the left vertebral artery due to low-density plaque. No flow limiting stenosis seen throughout the vertebral arteries. Skeleton: No acute or aggressive finding. Left simple lipoma  associated with the left trapezius. Other neck: Negative.  No incidental mass or inflammation. Upper chest: Negative Review of the MIP images confirms the above findings CTA HEAD FINDINGS Anterior circulation: Atheromatous plaque on the carotid siphons. Left ICA is larger than the right due to right A1 hypoplasia. Negative for branch occlusion or flow limiting stenosis. Conical outpouching from the supraclinoid left ICA directed medially and inferiorly measuring 2 mm in maximum. Posterior circulation: Codominant vertebral arteries. The vertebrobasilar arteries are smooth and widely patent. No branch occlusion, aneurysm, or flow limiting stenosis. Atheromatous irregularity of the proximal PCAs. Venous sinuses: Patent Anatomic variants: None significant Delayed phase: Not obtained Review of the MIP images confirms the above findings IMPRESSION: 1. No emergent vascular finding. There is atherosclerosis without flow limiting stenosis of major intracranial or cervical vessels. 2. Mild-to-moderate left vertebral origin stenosis. 3. 2 mm outpouching from the supraclinoid left ICA, shape favoring infundibulum over aneurysm. Electronically Signed   By: Marnee SpringJonathon  Watts M.D.   On: 06/04/2019 04:40   Ct Head Wo Contrast  Result Date: 06/03/2019 CLINICAL DATA:  Dizziness EXAM: CT HEAD WITHOUT CONTRAST TECHNIQUE: Contiguous axial images were obtained from the base of the skull through the vertex without intravenous contrast. COMPARISON:  01/03/2010 FINDINGS: Brain: Mild atrophic changes are noted. No findings to suggest acute hemorrhage, acute infarction or space-occupying mass lesion are noted. Vascular: No hyperdense vessel or unexpected calcification. Skull: Normal. Negative for fracture or focal lesion. Sinuses/Orbits: No acute finding. Other: None. IMPRESSION: Mild atrophic changes without acute abnormality. Electronically Signed   By: Alcide CleverMark  Lukens M.D.   On: 06/03/2019 21:58   Ct Angio Neck W Or Wo Contrast  Result  Date: 06/04/2019 CLINICAL DATA:  Stroke follow-up EXAM: CT ANGIOGRAPHY HEAD AND NECK TECHNIQUE: Multidetector CT imaging of the head and neck was performed using the standard protocol during bolus administration of intravenous contrast. Multiplanar CT image reconstructions and MIPs were obtained to evaluate the vascular anatomy. Carotid stenosis measurements (when applicable) are obtained utilizing NASCET criteria, using the distal internal carotid diameter as the denominator. CONTRAST:  75mL OMNIPAQUE IOHEXOL 350 MG/ML SOLN COMPARISON:  Brain MRI from earlier today FINDINGS: CTA NECK FINDINGS Aortic arch: 2 vessel branching. Right carotid system: Vessels are smooth and diffusely patent. No atheromatous calcification. Left carotid system: Mild atheromatous wall thickening of the common carotid and at the ICA bulb. No flow limiting stenosis or ulceration. Vertebral arteries: No proximal subclavian stenosis. Mild to moderate narrowing at the origin of the left vertebral artery due to low-density plaque. No flow limiting stenosis seen throughout the vertebral arteries. Skeleton: No acute or aggressive finding. Left simple lipoma associated with the left trapezius. Other neck: Negative.  No incidental mass or inflammation. Upper chest: Negative Review of the MIP images confirms the above findings CTA HEAD FINDINGS Anterior circulation: Atheromatous plaque on the carotid siphons. Left ICA is larger than the right due to right A1 hypoplasia. Negative for branch occlusion or flow limiting stenosis. Conical outpouching from the supraclinoid left ICA directed medially and inferiorly measuring 2 mm in maximum. Posterior circulation: Codominant vertebral arteries. The vertebrobasilar arteries are smooth and widely patent. No branch occlusion, aneurysm, or flow limiting stenosis. Atheromatous irregularity of the proximal PCAs. Venous sinuses: Patent Anatomic variants: None significant Delayed phase: Not obtained Review of the  MIP images confirms the above findings IMPRESSION: 1. No emergent vascular finding. There is atherosclerosis without flow limiting stenosis of major intracranial or cervical vessels. 2. Mild-to-moderate left vertebral origin stenosis. 3. 2 mm outpouching from the supraclinoid left ICA, shape favoring infundibulum over aneurysm. Electronically Signed   By: Marnee SpringJonathon  Watts M.D.   On: 06/04/2019 04:40   Mr Brain Wo Contrast (neuro Protocol)  Result Date: 06/04/2019 CLINICAL DATA:  68 y/o  M; weakness and dizziness. EXAM: MRI HEAD WITHOUT CONTRAST TECHNIQUE: Multiplanar, multiecho pulse sequences of the brain and surrounding structures were obtained without intravenous contrast. COMPARISON:  06/03/2019 CT head.  01/04/2010 MRI head. FINDINGS: Brain: Punctate focus of reduced diffusion within right posterior frontal white matter compatible with acute/early subacute infarction (series 5, image 83). No hemorrhage or mass effect. No extra-axial collection, hydrocephalus, mass effect, or herniation. Small chronic infarcts are present within genu of corpus callosum, bilateral thalami, left hemi pons, bilateral cerebellar hemispheres. Scattered punctate nonspecific T2 FLAIR hyperintensities in subcortical and periventricular white matter are compatible with mild chronic microvascular ischemic changes. Mild volume loss of the brain. Vascular: Normal flow voids. Skull and upper cervical spine: Normal marrow signal. Sinuses/Orbits: Negative. Other: None. IMPRESSION: 1. Punctate acute/early subacute small vessel infarction within the right posterior frontal white matter. No hemorrhage or mass effect. 2. Stable chronic microvascular ischemic changes, volume loss, and small chronic infarcts of the brain. These results were called by telephone at the time of interpretation on 06/04/2019 at 12:44 am to Dr. Judd Lienelo, who verbally acknowledged these results. Electronically Signed   By: Mitzi HansenLance  Furusawa-Stratton M.D.   On: 06/04/2019 00:47     Procedures Procedures (including critical care time)  Medications Ordered in ED Medications  sodium chloride flush (NS) 0.9 % injection 3 mL (has no administration in time range)  potassium chloride 10 mEq in 100 mL IVPB (has no administration in time range)  atorvastatin (LIPITOR) tablet 20 mg (has no administration in time range)  tamsulosin (FLOMAX) capsule 0.4 mg (has no administration in time range)   stroke: mapping our early stages of recovery book (has no administration in time range)  0.9 %  sodium chloride infusion (has no administration in time range)  acetaminophen (TYLENOL) tablet 650 mg (has no administration in  time range)    Or  acetaminophen (TYLENOL) solution 650 mg (has no administration in time range)    Or  acetaminophen (TYLENOL) suppository 650 mg (has no administration in time range)  senna-docusate (Senokot-S) tablet 1 tablet (has no administration in time range)  enoxaparin (LOVENOX) injection 40 mg (has no administration in time range)  aspirin suppository 300 mg (has no administration in time range)    Or  aspirin tablet 325 mg (has no administration in time range)  sodium chloride 0.9 % bolus 1,000 mL (0 mLs Intravenous Stopped 06/04/19 0248)  potassium chloride SA (K-DUR) CR tablet 40 mEq (40 mEq Oral Given 06/04/19 0100)  magnesium sulfate IVPB 1 g 100 mL (0 g Intravenous Stopped 06/04/19 0356)  iohexol (OMNIPAQUE) 350 MG/ML injection 75 mL (75 mLs Intravenous Contrast Given 06/04/19 0414)     Initial Impression / Assessment and Plan / ED Course  I have reviewed the triage vital signs and the nursing notes.  Pertinent labs & imaging results that were available during my care of the patient were reviewed by me and considered in my medical decision making (see chart for details).        Patient will be admitted to the hospital for further evaluation and care of this weakness with hypokalemia.  Also called neurology because he did have a small infarct  noted on his MRI.  [facility] will be admitting the patient.  Final Clinical Impressions(s) / ED Diagnoses   Final diagnoses:  Weakness  Hypokalemia    ED Discharge Orders    None       Dalia Heading, PA-C 06/04/19 [id number]    Veryl Speak, MD 06/04/19 [phone]

## 2019-06-04 NOTE — Progress Notes (Signed)
STROKE TEAM PROGRESS NOTE   INTERVAL HISTORY Pt is dressing up to leave AMA. Pt stated that he [place] a bad neighborhood, he has to go home quickly. He stated that all his symptoms resolved and he is OK with smoking cessation and he will go out to have his last cigarette and then quit.    Vitals:   06/04/19 0345 06/04/19 0444 06/04/19 0850 06/04/19 1212  BP: 118/75 122/73 (!) 145/70 (!) 132/58  Pulse: 83 81 75 75  Resp:  18 16 12   Temp:  98.5 F (36.9 C) 97.9 F (36.6 C) (!) 97.3 F (36.3 C)  TempSrc:  Oral Oral Oral  SpO2: 98% 99% 94% 99%  Weight:  83.7 kg    Height:  5\' 10"  (1.778 m)      CBC:  Recent Labs  Lab 06/03/19 2005 06/03/19 2019 06/04/19 0949  WBC 9.0  --  7.8  NEUTROABS 5.5  --   --   HGB 14.9 15.0 14.4  HCT 40.9 44.0 40.1  MCV 89.9  --  90.1  PLT 200  --  196    Basic Metabolic Panel:  Recent Labs  Lab 06/02/19 0956 06/03/19 2005 06/03/19 2019 06/04/19 0949  NA 138 130* 131*  --   K 3.5 2.6* 2.7*  --   CL 94* 93* 93*  --   CO2 24 25  --   --   GLUCOSE 96 105* 103*  --   BUN 14 11 12   --   CREATININE 1.34* 1.26* 1.20  --   CALCIUM 10.3* 9.3  --   --   MG 2.4* 1.8  --  2.2   Lipid Panel:     Component Value Date/Time   CHOL 111 06/04/2019 0949   CHOL 137 10/03/2018 1011   TRIG 86 06/04/2019 0949   HDL 34 (L) 06/04/2019 0949   HDL 33 (L) 10/03/2018 1011   CHOLHDL 3.3 06/04/2019 0949   VLDL 17 06/04/2019 0949   LDLCALC 60 06/04/2019 0949   LDLCALC 65 10/03/2018 1011   HgbA1c:  Lab Results  Component Value Date   HGBA1C 5.6 06/04/2019   Urine Drug Screen:     Component Value Date/Time   LABOPIA NONE DETECTED 06/03/2019 2025   COCAINSCRNUR NONE DETECTED 06/03/2019 2025   COCAINSCRNUR NEG 12/09/2009 0000   LABBENZ NONE DETECTED 06/03/2019 2025   LABBENZ NEG 12/09/2009 0000   AMPHETMU NONE DETECTED 06/03/2019 2025   THCU NONE DETECTED 06/03/2019 2025   LABBARB NONE DETECTED 06/03/2019 2025    Alcohol Level No results found for:  ETH  IMAGING Ct Angio Head W Or Wo Contrast  Result Date: 06/04/2019 CLINICAL DATA:  Stroke follow-up EXAM: CT ANGIOGRAPHY HEAD AND NECK TECHNIQUE: Multidetector CT imaging of the head and neck was performed using the standard protocol during bolus administration of intravenous contrast. Multiplanar CT image reconstructions and MIPs were obtained to evaluate the vascular anatomy. Carotid stenosis measurements (when applicable) are obtained utilizing NASCET criteria, using the distal internal carotid diameter as the denominator. CONTRAST:  75mL OMNIPAQUE IOHEXOL 350 MG/ML SOLN COMPARISON:  Brain MRI from earlier today FINDINGS: CTA NECK FINDINGS Aortic arch: 2 vessel branching. Right carotid system: Vessels are smooth and diffusely patent. No atheromatous calcification. Left carotid system: Mild atheromatous wall thickening of the common carotid and at the ICA bulb. No flow limiting stenosis or ulceration. Vertebral arteries: No proximal subclavian stenosis. Mild to moderate narrowing at the origin of the left vertebral artery due to low-density  plaque. No flow limiting stenosis seen throughout the vertebral arteries. Skeleton: No acute or aggressive finding. Left simple lipoma associated with the left trapezius. Other neck: Negative.  No incidental mass or inflammation. Upper chest: Negative Review of the MIP images confirms the above findings CTA HEAD FINDINGS Anterior circulation: Atheromatous plaque on the carotid siphons. Left ICA is larger than the right due to right A1 hypoplasia. Negative for branch occlusion or flow limiting stenosis. Conical outpouching from the supraclinoid left ICA directed medially and inferiorly measuring 2 mm in maximum. Posterior circulation: Codominant vertebral arteries. The vertebrobasilar arteries are smooth and widely patent. No branch occlusion, aneurysm, or flow limiting stenosis. Atheromatous irregularity of the proximal PCAs. Venous sinuses: Patent Anatomic variants: None  significant Delayed phase: Not obtained Review of the MIP images confirms the above findings IMPRESSION: 1. No emergent vascular finding. There is atherosclerosis without flow limiting stenosis of major intracranial or cervical vessels. 2. Mild-to-moderate left vertebral origin stenosis. 3. 2 mm outpouching from the supraclinoid left ICA, shape favoring infundibulum over aneurysm. Electronically Signed   By: Marnee SpringJonathon  Watts M.D.   On: 06/04/2019 04:40   Ct Head Wo Contrast  Result Date: 06/03/2019 CLINICAL DATA:  Dizziness EXAM: CT HEAD WITHOUT CONTRAST TECHNIQUE: Contiguous axial images were obtained from the base of the skull through the vertex without intravenous contrast. COMPARISON:  01/03/2010 FINDINGS: Brain: Mild atrophic changes are noted. No findings to suggest acute hemorrhage, acute infarction or space-occupying mass lesion are noted. Vascular: No hyperdense vessel or unexpected calcification. Skull: Normal. Negative for fracture or focal lesion. Sinuses/Orbits: No acute finding. Other: None. IMPRESSION: Mild atrophic changes without acute abnormality. Electronically Signed   By: Alcide CleverMark  Lukens M.D.   On: 06/03/2019 21:58   Ct Angio Neck W Or Wo Contrast  Result Date: 06/04/2019 CLINICAL DATA:  Stroke follow-up EXAM: CT ANGIOGRAPHY HEAD AND NECK TECHNIQUE: Multidetector CT imaging of the head and neck was performed using the standard protocol during bolus administration of intravenous contrast. Multiplanar CT image reconstructions and MIPs were obtained to evaluate the vascular anatomy. Carotid stenosis measurements (when applicable) are obtained utilizing NASCET criteria, using the distal internal carotid diameter as the denominator. CONTRAST:  75mL OMNIPAQUE IOHEXOL 350 MG/ML SOLN COMPARISON:  Brain MRI from earlier today FINDINGS: CTA NECK FINDINGS Aortic arch: 2 vessel branching. Right carotid system: Vessels are smooth and diffusely patent. No atheromatous calcification. Left carotid system:  Mild atheromatous wall thickening of the common carotid and at the ICA bulb. No flow limiting stenosis or ulceration. Vertebral arteries: No proximal subclavian stenosis. Mild to moderate narrowing at the origin of the left vertebral artery due to low-density plaque. No flow limiting stenosis seen throughout the vertebral arteries. Skeleton: No acute or aggressive finding. Left simple lipoma associated with the left trapezius. Other neck: Negative.  No incidental mass or inflammation. Upper chest: Negative Review of the MIP images confirms the above findings CTA HEAD FINDINGS Anterior circulation: Atheromatous plaque on the carotid siphons. Left ICA is larger than the right due to right A1 hypoplasia. Negative for branch occlusion or flow limiting stenosis. Conical outpouching from the supraclinoid left ICA directed medially and inferiorly measuring 2 mm in maximum. Posterior circulation: Codominant vertebral arteries. The vertebrobasilar arteries are smooth and widely patent. No branch occlusion, aneurysm, or flow limiting stenosis. Atheromatous irregularity of the proximal PCAs. Venous sinuses: Patent Anatomic variants: None significant Delayed phase: Not obtained Review of the MIP images confirms the above findings IMPRESSION: 1. No emergent vascular finding. There is  atherosclerosis without flow limiting stenosis of major intracranial or cervical vessels. 2. Mild-to-moderate left vertebral origin stenosis. 3. 2 mm outpouching from the supraclinoid left ICA, shape favoring infundibulum over aneurysm. Electronically Signed   By: Monte Fantasia M.D.   On: 06/04/2019 04:40   Mr Brain Wo Contrast (neuro Protocol)  Result Date: 06/04/2019 CLINICAL DATA:  68 y/o  M; weakness and dizziness. EXAM: MRI HEAD WITHOUT CONTRAST TECHNIQUE: Multiplanar, multiecho pulse sequences of the brain and surrounding structures were obtained without intravenous contrast. COMPARISON:  06/03/2019 CT head.  01/04/2010 MRI head.  FINDINGS: Brain: Punctate focus of reduced diffusion within right posterior frontal white matter compatible with acute/early subacute infarction (series 5, image 83). No hemorrhage or mass effect. No extra-axial collection, hydrocephalus, mass effect, or herniation. Small chronic infarcts are present within genu of corpus callosum, bilateral thalami, left hemi pons, bilateral cerebellar hemispheres. Scattered punctate nonspecific T2 FLAIR hyperintensities in subcortical and periventricular white matter are compatible with mild chronic microvascular ischemic changes. Mild volume loss of the brain. Vascular: Normal flow voids. Skull and upper cervical spine: Normal marrow signal. Sinuses/Orbits: Negative. Other: None. IMPRESSION: 1. Punctate acute/early subacute small vessel infarction within the right posterior frontal white matter. No hemorrhage or mass effect. 2. Stable chronic microvascular ischemic changes, volume loss, and small chronic infarcts of the brain. These results were called by telephone at the time of interpretation on 06/04/2019 at 12:44 am to Dr. Stark Jock, who verbally acknowledged these results. Electronically Signed   By: Kristine Garbe M.D.   On: 06/04/2019 00:47    PHYSICAL EXAM  Temp:  [97.3 F (36.3 C)-98.6 F (37 C)] 97.3 F (36.3 C) (07/02 1212) Pulse Rate:  [69-83] 75 (07/02 1212) Resp:  [12-20] 12 (07/02 1212) BP: (108-155)/(56-99) 132/58 (07/02 1212) SpO2:  [87 %-100 %] 99 % (07/02 1212) Weight:  [83.7 kg] 83.7 kg (07/02 0444)  General - Well nourished, well developed, in no apparent distress.  Ophthalmologic - fundi not visualized due to noncooperation.  Cardiovascular - Regular rate and rhythm.  Mental Status -  Level of arousal and orientation to time, place, and person were intact. Language including expression, naming, repetition, comprehension was assessed and found intact.  Cranial Nerves II - XII - II - Visual field intact OU. III, IV, VI -  Extraocular movements intact. V - Facial sensation intact bilaterally. VII - Facial movement intact bilaterally. VIII - Hearing & vestibular intact bilaterally. X - Palate elevates symmetrically. XI - Chin turning & shoulder shrug intact bilaterally. XII - Tongue protrusion intact.  Motor Strength - The patient's strength was normal in all extremities and pronator drift was absent.  Bulk was normal and fasciculations were absent.   Motor Tone - Muscle tone was assessed at the neck and appendages and was normal.  Reflexes - The patient's reflexes were symmetrical in all extremities and he had no pathological reflexes.  Sensory - Light touch, temperature/pinprick were assessed and were symmetrical.    Coordination - The patient had normal movements in the hands and feet with no ataxia or dysmetria.  Tremor was absent.  Gait and Station - deferred.   ASSESSMENT/PLAN James Riley is a 68 y.o. male with history of HTN, prediabetes, HLD, left pontine stroke with residual mild right hemiparesis since 2011 presenting with dizziness, difficulty walking and general lysed weakness and cramps in his legs.   Stroke:   Punctate R posterior frontal WM infarct likely secondary to small vessel disease source  CT head No  acute abnormality.  Mild atrophy.     MRI  small punctate right posterior frontal lobe WM infarct.  Stable small vessel disease and atrophy.  CTA head & neck no LVO.  Atherosclerosis without flow-limiting stenosis.  2 mm outpouching from supraclinoid L ICA favoring infundibulum  2D Echo pt refused as he needs to go  LDL 60  HgbA1c 5.6  Lovenox 40 mg subcu daily for VTE prophylaxis  No antithrombotic prior to admission, now on aspirin 81 mg daily and clopidogrel 75 mg daily x 3 weeks then aspirin alone.   Therapy recommendations: HH PT and OT  Disposition:  Patient left AMA  Hypertension  Stable . Permissive hypertension (OK if < 220/120) but gradually normalize  in 2-3 days . Long-term BP goal normotensive  Hyperlipidemia  Home meds: Lipitor 20, resumed in hospital  LDL 60, goal < 70  Continue statin at discharge  Tobacco abuse  Current smoker  Smoking cessation counseling provided  Pt is willing to quit  Other Stroke Risk Factors  Advanced age  ETOH use, advised to drink no more than 2 drink(s) a day  Hx stroke/TIA  01/2010- left pontine infarct with mild residual right hemapheresis  Other Active Problems  Hyperkalemia 2.6 in ED with mag of 1.8  Hospital day # 0  Neurology will sign off. Please call with questions. [facility]. Thanks for the consult.  Marvel PlanJindong Mairead Schwarzkopf, MD PhD Stroke Neurology 06/04/2019 3:42 PM   To contact Stroke Continuity provider, please refer to [email or web address]. After hours, contact General Neurology

## 2019-06-04 NOTE — H&P (Signed)
History and Physical    James GaussRonald W Troxler VWU:[id number] DOB: 06/16/1951 DOA: 06/03/2019  PCP: Claiborne RiggFleming, Zelda W, NP   Patient coming from: Home   Chief Complaint: Weakness, lightheadedness, numbness, leg cramps   HPI: James Riley is a 68 y.o. male with medical history significant for hypertension, hyperlipidemia, and reports suffering a stroke 18 years ago, now presenting to the emergency department for evaluation of generalized weakness, lightheadedness, leg numbness and tingling, and leg cramps.  Patient is alert and oriented, pleasant and cooperative, but has difficulty providing a consistent history.  He now states that he developed generalized weakness and lightheadedness yesterday morning, was having some pain in the right hip and numbness and tingling in the left leg, and was having difficulty ambulating due to this.  He also has been experiencing muscle cramps for at least a couple days now.  He denies any recent fall or trauma, denies change in vision or hearing, and he denies any facial numbness or weakness, or difficulty swallowing.  Denies fevers, chills, cough, shortness of breath, chest pain, diarrhea, or vomiting.  ED Course: Upon arrival to the ED, patient is found to be afebrile, saturating well on room air, and with normal blood pressure and heart rate.  EKG features sinus rhythm.  Noncontrast head CT is negative for acute intracranial abnormality.  CBC is unremarkable.  Urinalysis features a low specific gravity.  Chemistry panel is notable for a potassium of 2.6 and creatinine 1.26, similar to priors.  MRI brain is concerning for punctate acute/early subacute small vessel infarct within the right posterior frontal white matter.  Patient was given a liter of normal saline, 40 mEq oral potassium, 20 mEq IV potassium, and neurology was consulted by the ED physician.  Hospitalists are asked to admit.  Review of Systems:  All other systems reviewed and apart from HPI, are negative.   Past Medical History:  Diagnosis Date  . Hypercholesterolemia   . Hypertension   . Stroke [facility]) ~ 1989   "little weak on my right side since" (09/10/2016)    Past Surgical History:  Procedure Laterality Date  . APPENDECTOMY    . LAPAROSCOPIC APPENDECTOMY N/A 09/07/2016   Procedure: APPENDECTOMY LAPAROSCOPIC converted to open appendectomy;  Surgeon: Abigail Miyamotoouglas Blackman, MD;  [facility];  Service: General;  Laterality: N/A;     reports that he has been smoking cigarettes. He has a 100.00 pack-year smoking history. He has never used smokeless tobacco. He reports current alcohol use of about 1.0 standard drinks of alcohol per week. He reports that he does not use drugs.  No Known Allergies  Family History  Problem Relation Age of Onset  . Diabetes Neg Hx   . Hypertension Neg Hx      Prior to Admission medications   Medication Sig Start Date End Date Taking? Authorizing Provider  amLODipine (NORVASC) 10 MG tablet Take 1 tablet (10 mg total) by mouth daily. 06/02/19  Yes Claiborne RiggFleming, Zelda W, NP  atorvastatin (LIPITOR) 20 MG tablet Take 1 tablet (20 mg total) by mouth daily. 06/02/19  Yes Claiborne RiggFleming, Zelda W, NP  hydrochlorothiazide (HYDRODIURIL) 25 MG tablet Take 1 tablet (25 mg total) by mouth daily. 06/02/19  Yes Claiborne RiggFleming, Zelda W, NP  losartan (COZAAR) 50 MG tablet Take 1 tablet (50 mg total) by mouth daily. 06/02/19 08/31/19 Yes Claiborne RiggFleming, Zelda W, NP  sildenafil (VIAGRA) 100 MG tablet Take 1 tablet (100 mg total) by mouth daily as needed for erectile dysfunction. 06/02/19  Yes Bertram DenverFleming, Zelda  W, NP  tamsulosin (FLOMAX) 0.4 MG CAPS capsule Take 1 capsule (0.4 mg total) by mouth daily. Patient will pick up scripts today. 06/02/19 07/02/19 Yes [name], NP    Physical Exam: Vitals:   06/03/19 2230 06/03/19 2245 06/03/19 2300 06/03/19 2315  BP: 110/62 (!) 108/59 (!) 108/56 122/67  Pulse: 76 73 69 72  Resp: 15 16 17 20   Temp:      TempSrc:      SpO2: 97% 96% 96% 99%    Constitutional:  NAD, calm  Eyes: PERTLA, lids and conjunctivae normal ENMT: Mucous membranes are moist. Posterior pharynx clear of any exudate or lesions.   Neck: normal, supple, no masses, no thyromegaly Respiratory: no wheezing, no crackles. Normal respiratory effort. No accessory muscle use.  Cardiovascular: S1 & S2 heard, regular rate and rhythm. No extremity edema.   Abdomen: No distension, no tenderness, soft. Bowel sounds normal.  Musculoskeletal: no clubbing / cyanosis. No joint deformity upper and lower extremities.   Skin: no significant rashes, lesions, ulcers. Warm, dry, well-perfused. Neurologic: CN 2-12 grossly intact. Sensation to light touch intact in face and distal extremities. Strength 5/5 in all 4 limbs.  Psychiatric: Alert and oriented to person, place, and situation. Pleasant and cooperative.    Labs on Admission: I have personally reviewed following labs and imaging studies  CBC: Recent Labs  Lab 06/03/19 2005 06/03/19 2019  WBC 9.0  --   NEUTROABS 5.5  --   HGB 14.9 15.0  HCT 40.9 44.0  MCV 89.9  --   PLT 200  --    Basic Metabolic Panel: Recent Labs  Lab 06/02/19 0956 06/03/19 2005 06/03/19 2019  NA 138 130* 131*  K 3.5 2.6* 2.7*  CL 94* 93* 93*  CO2 24 25  --   GLUCOSE 96 105* 103*  BUN 14 11 12   CREATININE 1.34* 1.26* 1.20  CALCIUM 10.3* 9.3  --   MG 2.4* 1.8  --    GFR: Estimated Creatinine Clearance: 61.8 mL/min (by C-G formula based on SCr of 1.2 mg/dL). Liver Function Tests: No results for input(s): AST, ALT, ALKPHOS, BILITOT, PROT, ALBUMIN in the last 168 hours. No results for input(s): LIPASE, AMYLASE in the last 168 hours. No results for input(s): AMMONIA in the last 168 hours. Coagulation Profile: Recent Labs  Lab 06/03/19 2005  INR 1.2   Cardiac Enzymes: No results for input(s): CKTOTAL, CKMB, CKMBINDEX, TROPONINI in the last 168 hours. BNP (last 3 results) No results for input(s): PROBNP in the last 8760 hours. HbA1C: No results for  input(s): HGBA1C in the last 72 hours. CBG: No results for input(s): GLUCAP in the last 168 hours. Lipid Profile: No results for input(s): CHOL, HDL, LDLCALC, TRIG, CHOLHDL, LDLDIRECT in the last 72 hours. Thyroid Function Tests: No results for input(s): TSH, T4TOTAL, FREET4, T3FREE, THYROIDAB in the last 72 hours. Anemia Panel: No results for input(s): VITAMINB12, FOLATE, FERRITIN, TIBC, IRON, RETICCTPCT in the last 72 hours. Urine analysis:    Component Value Date/Time   COLORURINE COLORLESS (A) 06/03/2019 2025   APPEARANCEUR CLEAR 06/03/2019 2025   LABSPEC 1.002 (L) 06/03/2019 2025   PHURINE 5.0 06/03/2019 2025   GLUCOSEU NEGATIVE 06/03/2019 2025   HGBUR NEGATIVE 06/03/2019 2025   HGBUR negative 02/05/2011 1028   BILIRUBINUR NEGATIVE 06/03/2019 2025   BILIRUBINUR negative 06/02/2019 0929   KETONESUR NEGATIVE 06/03/2019 2025   PROTEINUR NEGATIVE 06/03/2019 2025   UROBILINOGEN 0.2 06/02/2019 0929   UROBILINOGEN 0.2 09/06/2015 1028  NITRITE NEGATIVE 06/03/2019 2025   LEUKOCYTESUR NEGATIVE 06/03/2019 2025   Sepsis Labs: @LABRCNTIP (procalcitonin:4,lacticidven:4) )No results found for this or any previous visit (from the past 240 hour(s)).   Radiological Exams on Admission: Ct Head Wo Contrast  Result Date: 06/03/2019 CLINICAL DATA:  Dizziness EXAM: CT HEAD WITHOUT CONTRAST TECHNIQUE: Contiguous axial images were obtained from the base of the skull through the vertex without intravenous contrast. COMPARISON:  01/03/2010 FINDINGS: Brain: Mild atrophic changes are noted. No findings to suggest acute hemorrhage, acute infarction or space-occupying mass lesion are noted. Vascular: No hyperdense vessel or unexpected calcification. Skull: Normal. Negative for fracture or focal lesion. Sinuses/Orbits: No acute finding. Other: None. IMPRESSION: Mild atrophic changes without acute abnormality. Electronically Signed   By: Alcide CleverMark  Lukens M.D.   On: 06/03/2019 21:58   Mr Brain Wo Contrast (neuro  Protocol)  Result Date: 06/04/2019 CLINICAL DATA:  68 y/o  M; weakness and dizziness. EXAM: MRI HEAD WITHOUT CONTRAST TECHNIQUE: Multiplanar, multiecho pulse sequences of the brain and surrounding structures were obtained without intravenous contrast. COMPARISON:  06/03/2019 CT head.  01/04/2010 MRI head. FINDINGS: Brain: Punctate focus of reduced diffusion within right posterior frontal white matter compatible with acute/early subacute infarction (series 5, image 83). No hemorrhage or mass effect. No extra-axial collection, hydrocephalus, mass effect, or herniation. Small chronic infarcts are present within genu of corpus callosum, bilateral thalami, left hemi pons, bilateral cerebellar hemispheres. Scattered punctate nonspecific T2 FLAIR hyperintensities in subcortical and periventricular white matter are compatible with mild chronic microvascular ischemic changes. Mild volume loss of the brain. Vascular: Normal flow voids. Skull and upper cervical spine: Normal marrow signal. Sinuses/Orbits: Negative. Other: None. IMPRESSION: 1. Punctate acute/early subacute small vessel infarction within the right posterior frontal white matter. No hemorrhage or mass effect. 2. Stable chronic microvascular ischemic changes, volume loss, and small chronic infarcts of the brain. These results were called by telephone at the time of interpretation on 06/04/2019 at 12:44 am to Dr. Judd Lienelo, who verbally acknowledged these results. Electronically Signed   By: Mitzi HansenLance  Furusawa-Stratton M.D.   On: 06/04/2019 00:47    EKG: Independently reviewed. Sinus rhythm, rate 77, QTc 423 ms.   Assessment/Plan   1. Ischemic CVA  - Presents with vague malaise including gen weakness and lightheadedness, reported RLE numbness initially but later said the right hip was hurting but the left leg was numb, onset ~12 hrs prior to arrival  - ED workup notable for potassium of 2.6 and MRI brain with punctate acute or early subacute infarction within right  posterior frontal white matter - Continue cardiac monitoring, frequent neuro checks, PT/OT/SLP consultations  - Check carotid US, echocardiogram, fasting lipids, and A1c   - Continue statin, start ASA   2. Hypokalemia  - Serum potassium is 2.6 in ED with mag level 1.8  - Denies vomiting or diarrhea, takes HCTZ but had normal potassium on 06/02/19  - Treated in ED with 40 mEq oral and 20 mEq IV potassium, as well as 1 g magnesium  - Check urinary potassium-to-creatinine ratio, hold HCTZ, repeat chem panel in am   3. Hypertension  - BP at goal  - Hold antihypertensives initially in acute phase of ischemic CVA     PPE: Mask, face shield  DVT prophylaxis: Lovenox  Code Status: Full  Family Communication: Discussed with patient  Consults called: Neurology consulted by ED physician  Admission status: Observation     Briscoe Deutscherimothy S Tajha Sammarco, MD [facility] Pager [phone]  If 7PM-7AM, please contact night-coverage  [email or web address] Password [email or web address]  06/04/2019, 2:11 AM

## 2019-06-04 NOTE — ED Notes (Addendum)
Pt returned from mri

## 2019-06-05 LAB — NOVEL CORONAVIRUS, NAA (HOSP ORDER, SEND-OUT TO REF LAB; TAT 18-24 HRS): SARS-CoV-2, NAA: NOT DETECTED

## 2019-06-24 ENCOUNTER — Ambulatory Visit: Payer: Medicare HMO | Admitting: Nurse Practitioner

## 2019-07-28 ENCOUNTER — Ambulatory Visit: Payer: Medicare HMO | Attending: Nurse Practitioner | Admitting: Nurse Practitioner

## 2019-07-28 ENCOUNTER — Ambulatory Visit (HOSPITAL_BASED_OUTPATIENT_CLINIC_OR_DEPARTMENT_OTHER): Payer: Medicare HMO | Admitting: Pharmacist

## 2019-07-28 ENCOUNTER — Other Ambulatory Visit: Payer: Self-pay

## 2019-07-28 ENCOUNTER — Encounter: Payer: Self-pay | Admitting: Nurse Practitioner

## 2019-07-28 VITALS — BP 130/69 | HR 73 | Temp 98.1°F | Ht 70.5 in | Wt 188.0 lb

## 2019-07-28 DIAGNOSIS — Z1159 Encounter for screening for other viral diseases: Secondary | ICD-10-CM

## 2019-07-28 DIAGNOSIS — R7303 Prediabetes: Secondary | ICD-10-CM | POA: Diagnosis not present

## 2019-07-28 DIAGNOSIS — I10 Essential (primary) hypertension: Secondary | ICD-10-CM

## 2019-07-28 DIAGNOSIS — Z23 Encounter for immunization: Secondary | ICD-10-CM

## 2019-07-28 DIAGNOSIS — I639 Cerebral infarction, unspecified: Secondary | ICD-10-CM

## 2019-07-28 DIAGNOSIS — E876 Hypokalemia: Secondary | ICD-10-CM

## 2019-07-28 LAB — GLUCOSE, POCT (MANUAL RESULT ENTRY): POC Glucose: 116 mg/dl — AB (ref 70–99)

## 2019-07-28 MED ORDER — SPIRONOLACTONE 25 MG PO TABS: 25.0000 mg | ORAL_TABLET | Freq: Every day | ORAL | 3 refills | Status: DC

## 2019-07-28 MED FILL — SPIRONOLACTONE 25 MG TABLET: 25 | 30 days supply | Qty: 30 | Fill #0

## 2019-07-28 NOTE — Patient Instructions (Addendum)
STOP TAKING HYDROCHLOROTHIAZIDE AS THIS MAY BE CAUSING YOUR POTASSIUM TO BE LOW.  I HAVE STARTED YOU ON SPIRONOLACTONE INSTEAD

## 2019-07-28 NOTE — Progress Notes (Signed)
Assessment & Plan:  James Riley was seen today for follow-up.  Diagnoses and all orders for this visit:  Essential hypertension -     spironolactone (ALDACTONE) 25 MG tablet; Take 1 tablet (25 mg total) by mouth daily. Started on spironolactone due to recurrent hypokalemia.  He was instructed today in the office as well as his AVS to stop HCTZ today.   Continue all antihypertensives as prescribed.  Remember to bring in your blood pressure log with you for your follow up appointment.  DASH/Mediterranean Diets are healthier choices for HTN.   Prediabetes -     Glucose (CBG) Lab Results  Component Value Date   HGBA1C 5.6 06/04/2019  Well controlled with diet and exercise  Acute ischemic stroke [facility]) -     Ambulatory referral to Neurology  Hypokalemia -     Basic Metabolic Panel  Need for hepatitis C screening test -     Hepatitis C Antibody    Patient has been counseled on age-appropriate routine health concerns for screening and prevention. These are reviewed and up-to-date. Referrals have been placed accordingly. Immunizations are up-to-date or declined.    Subjective:   Chief Complaint  Patient presents with  . Follow-up    Pt. is here for a follow up on HTN. Pt. stated he had a stroke last month.   [name] 68 y.o. male presents to office today for follow up.   has a past medical history of Hypercholesterolemia, Hypertension, and Stroke (James Riley) (~ 1989).  Essential Hypertension Blood pressure is well controlled today. He endorses medication compliance taking losartan 50 mg daily, HCTZ 25 mg and amlodipine 10 mg daily. Denies chest pain, shortness of breath, palpitations, lightheadedness, dizziness, headaches or BLE edema.  BP Readings from Last 3 Encounters:  07/28/19 130/69  06/04/19 (!) 132/58  06/02/19 111/67   Stroke Admitted on 06-03-2019 and left AMA 06-04-2019 after presenting to the ED with generalized weakness, lightheadedness, leg numbness and  tingling and leg cramps. MRI brain concerning for punctate acute/early subacute small vessel infarct within the right posterior frontal white matter. Potassium was also 2.6.  PER ED NOTE:  Carotid US, echocardiogram, repeat lab draw were ordered, however were not performed as pt left against medical advice -While in hospital, patient was continued on statin, ASA - Neurology was consulted this visit - Patient was able oriented x 3, able to verbalize his medical condition, what the treatment was, and able to state the possible consequence of not being treated if he left AMA. Pt verbalized "I know I'll die. We're all going to die anyway" Today I have informed patient that I am aware he left AMA from the hospital with a stroke. He states "they had me in there too long and wouldn't give me any food or nothin". I explained to him that he couldn't eat because they need to make sure his stroke was not progressing and some of the studies would have required that he did not eat. I also explained to him that he needs to stay at the hospital if this is recommended by the hospital staff. I also instructed him that we would need to recheck his critical low potassium and he would also be referred to neurology and will need an echocardiogram and possible referral to cardiology. He does have a history of mild residual RUE weakness from previous stroke but denies any new deficits since most recent hospital admission.    Review of Systems  Constitutional: Negative for fever,  malaise/fatigue and weight loss.  HENT: Negative.  Negative for nosebleeds.   Eyes: Negative.  Negative for blurred vision, double vision and photophobia.  Respiratory: Negative.  Negative for cough and shortness of breath.   Cardiovascular: Negative.  Negative for chest pain, palpitations and leg swelling.  Gastrointestinal: Negative.  Negative for heartburn, nausea and vomiting.  Musculoskeletal: Negative.  Negative for myalgias.   Neurological: Positive for weakness (residual right sided since stroke a few years ago). Negative for dizziness, focal weakness, seizures and headaches.  Psychiatric/Behavioral: Negative.  Negative for suicidal ideas.    Past Medical History:  Diagnosis Date  . Hypercholesterolemia   . Hypertension   . Stroke [facility](HCC) ~ 1989   "little weak on my right side since" (09/10/2016)    Past Surgical History:  Procedure Laterality Date  . APPENDECTOMY    . LAPAROSCOPIC APPENDECTOMY N/A 09/07/2016   Procedure: APPENDECTOMY LAPAROSCOPIC converted to open appendectomy;  Surgeon: Abigail Miyamotoouglas Blackman, MD;  Location: [facility] OR;  Service: General;  Laterality: N/A;    Family History  Problem Relation Age of Onset  . Diabetes Neg Hx   . Hypertension Neg Hx     Social History Reviewed with no changes to be made today.   Outpatient Medications Prior to Visit  Medication Sig Dispense Refill  . amLODipine (NORVASC) 10 MG tablet Take 1 tablet (10 mg total) by mouth daily. 90 tablet 1  . atorvastatin (LIPITOR) 20 MG tablet Take 1 tablet (20 mg total) by mouth daily. 90 tablet 2  . losartan (COZAAR) 50 MG tablet Take 1 tablet (50 mg total) by mouth daily. 90 tablet 6  . hydrochlorothiazide (HYDRODIURIL) 25 MG tablet Take 1 tablet (25 mg total) by mouth daily. 90 tablet 1  . sildenafil (VIAGRA) 100 MG tablet Take 1 tablet (100 mg total) by mouth daily as needed for erectile dysfunction. (Patient not taking: Reported on 07/28/2019) 10 tablet 3   No facility-administered medications prior to visit.     No Known Allergies     Objective:    BP 130/69 (BP Location: Left Arm, Patient Position: Sitting, Cuff Size: Normal)   Pulse 73   Temp 98.1 F (36.7 C) (Oral)   Ht 5' 10.5" (1.791 m)   Wt 188 lb (85.3 kg)   SpO2 99%   BMI 26.59 kg/m  Wt Readings from Last 3 Encounters:  07/28/19 188 lb (85.3 kg)  06/04/19 184 lb 8.4 oz (83.7 kg)  06/02/19 187 lb (84.8 kg)    Physical Exam Vitals signs and nursing  note reviewed.  Constitutional:      Appearance: He is well-developed.  HENT:     Head: Normocephalic and atraumatic.  Neck:     Musculoskeletal: Normal range of motion.  Cardiovascular:     Rate and Rhythm: Normal rate and regular rhythm.     Heart sounds: Normal heart sounds. No murmur. No friction rub. No gallop.   Pulmonary:     Effort: Pulmonary effort is normal. No tachypnea or respiratory distress.     Breath sounds: Normal breath sounds. No decreased breath sounds, wheezing, rhonchi or rales.  Chest:     Chest wall: No tenderness.  Abdominal:     General: Bowel sounds are normal.     Palpations: Abdomen is soft.  Musculoskeletal: Normal range of motion.  Skin:    General: Skin is warm and dry.  Neurological:     Mental Status: He is alert and oriented to person, place, and time.  Coordination: Coordination normal.  Psychiatric:        Behavior: Behavior normal. Behavior is cooperative.        Thought Content: Thought content normal.        Judgment: Judgment normal.          Patient has been counseled extensively about nutrition and exercise as well as the importance of adherence with medications and regular follow-up. The patient was given clear instructions to go to ER or return to medical center if symptoms don't improve, worsen or new problems develop. The patient verbalized understanding.   Follow-up: Return in about 2 weeks (around 08/11/2019) for BP recheck with luke. Started spirinolactone due to Hypokalemia.   Claiborne RiggZelda W Jamiee Milholland, FNP-BC [facility] [place], [place] [phone]   08/01/2019, 11:27 PM

## 2019-07-28 NOTE — Progress Notes (Signed)
Pt presents for vaccination against influenza. Consent given; counseling provided. No contraindications exist. Vaccine administered.  

## 2019-07-29 LAB — BASIC METABOLIC PANEL
BUN/Creatinine Ratio: 21 (ref 10–24)
BUN: 22 mg/dL (ref 8–27)
CO2: 22 mmol/L (ref 20–29)
Calcium: 10.1 mg/dL (ref 8.6–10.2)
Chloride: 101 mmol/L (ref 96–106)
Creatinine, Ser: 1.04 mg/dL (ref 0.76–1.27)
GFR calc Af Amer: 85 mL/min/{1.73_m2} (ref >59)
GFR calc non Af Amer: 73 mL/min/{1.73_m2} (ref >59)
Glucose: 103 mg/dL — ABNORMAL HIGH (ref 65–99)
Potassium: 3.6 mmol/L (ref 3.5–5.2)
Sodium: 140 mmol/L (ref 134–144)

## 2019-07-29 LAB — HEPATITIS C ANTIBODY: Hep C Virus Ab: >11 s/co ratio — ABNORMAL HIGH (ref 0.0–0.9)

## 2019-07-30 ENCOUNTER — Other Ambulatory Visit: Payer: Self-pay | Admitting: Nurse Practitioner

## 2019-07-30 DIAGNOSIS — R768 Other specified abnormal immunological findings in serum: Secondary | ICD-10-CM

## 2019-08-01 ENCOUNTER — Encounter: Payer: Self-pay | Admitting: Nurse Practitioner

## 2019-08-20 ENCOUNTER — Ambulatory Visit (HOSPITAL_COMMUNITY)
Admission: RE | Admit: 2019-08-20 | Discharge: 2019-08-20 | Disposition: A | Discharge status: Home / Self Care | Payer: Medicare HMO | Source: Ambulatory Visit | Attending: Nurse Practitioner | Admitting: Nurse Practitioner

## 2019-08-20 DIAGNOSIS — I119 Hypertensive heart disease without heart failure: Secondary | ICD-10-CM | POA: Insufficient documentation

## 2019-08-20 DIAGNOSIS — F17200 Nicotine dependence, unspecified, uncomplicated: Secondary | ICD-10-CM | POA: Insufficient documentation

## 2019-08-20 DIAGNOSIS — E785 Hyperlipidemia, unspecified: Secondary | ICD-10-CM | POA: Insufficient documentation

## 2019-08-20 DIAGNOSIS — I639 Cerebral infarction, unspecified: Secondary | ICD-10-CM | POA: Insufficient documentation

## 2019-08-20 NOTE — Progress Notes (Signed)
  Echocardiogram 2D Echocardiogram has been performed.  Darlina Sicilian M 08/20/2019, 9:46 AM

## 2019-09-10 ENCOUNTER — Other Ambulatory Visit: Payer: Self-pay

## 2019-09-10 ENCOUNTER — Encounter: Payer: Self-pay | Admitting: Neurology

## 2019-09-10 ENCOUNTER — Ambulatory Visit (INDEPENDENT_AMBULATORY_CARE_PROVIDER_SITE_OTHER): Payer: Medicare HMO | Admitting: Neurology

## 2019-09-10 VITALS — BP 148/75 | HR 75 | Temp 97.4°F | Ht 70.0 in | Wt 188.6 lb

## 2019-09-10 DIAGNOSIS — I6381 Other cerebral infarction due to occlusion or stenosis of small artery: Secondary | ICD-10-CM | POA: Diagnosis not present

## 2019-09-10 MED ORDER — ASPIRIN EC 81 MG PO TBEC: 81.0000 mg | DELAYED_RELEASE_TABLET | Freq: Every day | ORAL | 1 refills | Status: DC

## 2019-09-10 MED FILL — LOSARTAN POTASSIUM 50 MG TA: 50 | 90 days supply | Qty: 90 | Fill #1

## 2019-09-10 MED FILL — ATORVASTATIN CALCIUM 20 MG: 20 | 90 days supply | Qty: 90 | Fill #1

## 2019-09-10 MED FILL — AMLODIPINE BESYLATE 10 MG T: 10 | 90 days supply | Qty: 90 | Fill #1

## 2019-09-10 MED FILL — SPIRONOLACTONE 25 MG TABLET: 25 | 30 days supply | Qty: 30 | Fill #1

## 2019-09-10 NOTE — Progress Notes (Signed)
[facility] [address] [place]. [place] [address] [phone]       OFFICE CONSULT NOTE  [name] Date of Birth:  06/18/1951 Medical Record Number:  [id number]   Referring MD: James Riley  Reason for Referral: Stroke  HPI: [name] is a 68 year old African-American male seen today for initial office consultation visit for stroke.  History is obtained from the patient, review of electronic medical records and have personally reviewed imaging films in PACS.  He has a past medical history of hypertension, hyperlipidemia, prediabetes and remote left pontine stroke with residual mild right-sided weakness since 2011.  [facility] with symptoms beginning the day prior.  He complained of generalized weakness dizziness lightheadedness and some difficulty walking.  Due to multitude of complaints an MRI scan of the brain was obtained which showed a small area of restricted diffusion in the right posterior frontal white matter which was felt to be an acute infarct.  Patient denied any specific symptoms of slurred speech left facial weakness of left arm or body weakness.  He admitted that he must have been dehydrated as he had not been eating and drinking well and is unclear whether his increased right-sided weakness and gait difficulties were worsening of his old stroke due to dehydration.  CT angiogram of the brain and neck were obtained which showed no significant large vessel stenosis or occlusion.  A 2 mm outpouching from supraclinoid left ICA was noted which likely represented a infundibulum rather than an aneurysm.  The patient refused to get a 2D echo as he wanted to go home.  LDL cholesterol was 60 mg percent and hemoglobin A1c was 5.6.  Patient was not on any antithrombotics prior to admission and was placed on aspirin and Plavix which also apparently has not been taking.  He states is done well his gait and walking  are back to baseline.  His blood pressure seems well controlled and today it is 148/75.  Patient was counseled to quit smoking but states he has cut back a bit but is still smoking.  Is tolerating Lipitor well without muscle aches and pains.  He has no new complaints.  ROS:   14 system review of systems is positive for dizziness, weakness and all other systems negative  PMH:  Past Medical History:  Diagnosis Date  . Hypercholesterolemia   . Hypertension   . Stroke [facility](HCC) ~ 1989   "little weak on my right side since" (09/10/2016)    Social History:  Social History   Socioeconomic History  . Marital status: Divorced    Spouse name: Not on file  . Number of children: Not on file  . Years of education: Not on file  . Highest education level: Not on file  Occupational History  . Not on file  Social Needs  . Financial resource strain: Not on file  . Food insecurity    Worry: Not on file    Inability: Not on file  . Transportation needs    Medical: Not on file    Non-medical: Not on file  Tobacco Use  . Smoking status: Current Every Day Smoker    Packs/day: 1.00    Years: 50.00    Pack years: 50.00    Types: Cigarettes  . Smokeless tobacco: Never Used  Substance and Sexual Activity  . Alcohol use: Not Currently    Alcohol/week: 1.0 standard drinks    Types:  1 Cans of beer per week    Comment: a beer once a week. As of July 2020 reports no alcohol at all  . Drug use: No  . Sexual activity: Not Currently  Lifestyle  . Physical activity    Days per week: Not on file    Minutes per session: Not on file  . Stress: Not on file  Relationships  . Social Herbalist on phone: Not on file    Gets together: Not on file    Attends religious service: Not on file    Active member of club or organization: Not on file    Attends meetings of clubs or organizations: Not on file    Relationship status: Not on file  . Intimate partner violence    Fear of current or ex  partner: Not on file    Emotionally abused: Not on file    Physically abused: Not on file    Forced sexual activity: Not on file  Other Topics Concern  . Not on file  Social History Narrative  . Not on file    Medications:   Current Outpatient Medications on File Prior to Visit  Medication Sig Dispense Refill  . amLODipine (NORVASC) 10 MG tablet Take 1 tablet (10 mg total) by mouth daily. 90 tablet 1  . atorvastatin (LIPITOR) 20 MG tablet Take 1 tablet (20 mg total) by mouth daily. 90 tablet 2  . losartan (COZAAR) 50 MG tablet Take 1 tablet (50 mg total) by mouth daily. 90 tablet 6  . spironolactone (ALDACTONE) 25 MG tablet Take 1 tablet (25 mg total) by mouth daily. 30 tablet 3   No current facility-administered medications on file prior to visit.     Allergies:  No Known Allergies  Physical Exam General: well developed, well nourished middle-aged African-American male, seated, in no evident distress Head: head normocephalic and atraumatic.   Neck: supple with no carotid or supraclavicular bruits Cardiovascular: regular rate and rhythm, no murmurs Musculoskeletal: no deformity Skin:  no rash/petichiae Vascular:  Normal pulses all extremities  Neurologic Exam Mental Status: Awake and fully alert. Oriented to place and time. Recent and remote memory intact. Attention span, concentration and fund of knowledge appropriate. Mood and affect appropriate.  Cranial Nerves: Fundoscopic exam reveals sharp disc margins. Pupils equal, briskly reactive to light. Extraocular movements full without nystagmus. Visual fields full to confrontation. Hearing intact. Facial sensation intact.  Mild right nasolabial fold asymmetry.  Tongue, palate moves normally and symmetrically.  Motor: Normal bulk and tone. Normal strength in all tested extremity muscles.  Except diminished fine finger movements on the right and mild right grip weakness.  Orbits left over right upper extremity. Sensory.: intact to  touch , pinprick , position and vibratory sensation.  Coordination: Rapid alternating movements normal in all extremities. Finger-to-nose and heel-to-shin performed accurately bilaterally. Gait and Station: Arises from chair without difficulty. Stance is normal. Gait demonstrates normal stride length and balance . Able to heel, toe and tandem walk with slight difficulty.  Reflexes: 1+ and symmetric. Toes downgoing.   NIHSS 1 Modified Hyams 2   ASSESSMENT: 68 year old African-American male with right frontal white matter lacunar infarct in July 2020 due to small vessel disease.  Vascular risk factors of hypertension, old stroke and hyperlipidemia only.     PLAN: I had a long d/w patient about his recent lacunar stroke, risk for recurrent stroke/TIAs, personally independently reviewed imaging studies and stroke evaluation results and answered questions.Start aspirin 81  mg daily  for secondary stroke prevention and maintain strict control of hypertension with blood pressure goal below 130/90, diabetes with hemoglobin A1c goal below 6.5% and lipids with LDL cholesterol goal below 70 mg/dL. I also advised the patient to eat a healthy diet with plenty of whole grains, cereals, fruits and vegetables, exercise regularly and maintain ideal body weight. I have counselled patient to quit smoking completely.  Greater than 50% time during this 45-minute consultation visit was spent on counseling and coordination of care about lacunar stroke and discussion about stroke prevention and treatment and answering questions followup in the future with my nurse practitioner Shanda Bumps in 6 months or call earlier if necessary. Delia Heady, MD  [facility] [address] [place], [place] [address]  Phone [phone] Fax [phone]  Note: This document was prepared with digital dictation and possible smart phrase technology. Any transcriptional errors that result from this process are  unintentional.

## 2019-09-10 NOTE — Patient Instructions (Signed)
I had a long d/w patient about his recent lacunar stroke, risk for recurrent stroke/TIAs, personally independently reviewed imaging studies and stroke evaluation results and answered questions.Start aspirin 81 mg daily  for secondary stroke prevention and maintain strict control of hypertension with blood pressure goal below 130/90, diabetes with hemoglobin A1c goal below 6.5% and lipids with LDL cholesterol goal below 70 mg/dL. I also advised the patient to eat a healthy diet with plenty of whole grains, cereals, fruits and vegetables, exercise regularly and maintain ideal body weight. I have counselled patient to quit smoking completely. Followup in the future with my nurse practitioner Shanda Bumps in 6 months or call earlier if necessary.  Stroke Prevention Some medical conditions and behaviors are associated with a higher chance of having a stroke. You can help prevent a stroke by making nutrition, lifestyle, and other changes, including managing any medical conditions you may have. What nutrition changes can be made?   Eat healthy foods. You can do this by: ? Choosing foods high in fiber, such as fresh fruits and vegetables and whole grains. ? Eating at least 5 or more servings of fruits and vegetables a day. Try to fill half of your plate at each meal with fruits and vegetables. ? Choosing lean protein foods, such as lean cuts of meat, poultry without skin, fish, tofu, beans, and nuts. ? Eating low-fat dairy products. ? Avoiding foods that are high in salt (sodium). This can help lower blood pressure. ? Avoiding foods that have saturated fat, trans fat, and cholesterol. This can help prevent high cholesterol. ? Avoiding processed and premade foods.  Follow your health care provider's specific guidelines for losing weight, controlling high blood pressure (hypertension), lowering high cholesterol, and managing diabetes. These may include: ? Reducing your daily calorie intake. ? Limiting your daily  sodium intake to 1,500 milligrams (mg). ? Using only healthy fats for cooking, such as olive oil, canola oil, or sunflower oil. ? Counting your daily carbohydrate intake. What lifestyle changes can be made?  Maintain a healthy weight. Talk to your health care provider about your ideal weight.  Get at least 30 minutes of moderate physical activity at least 5 days a week. Moderate activity includes brisk walking, biking, and swimming.  Do not use any products that contain nicotine or tobacco, such as cigarettes and e-cigarettes. If you need help quitting, ask your health care provider. It may also be helpful to avoid exposure to secondhand smoke.  Limit alcohol intake to no more than 1 drink a day for nonpregnant women and 2 drinks a day for men. One drink equals 12 oz of beer, 5 oz of wine, or 1 oz of hard liquor.  Stop any illegal drug use.  Avoid taking birth control pills. Talk to your health care provider about the risks of taking birth control pills if: ? You are over 33 years old. ? You smoke. ? You get migraines. ? You have ever had a blood clot. What other changes can be made?  Manage your cholesterol levels. ? Eating a healthy diet is important for preventing high cholesterol. If cholesterol cannot be managed through diet alone, you may also need to take medicines. ? Take any prescribed medicines to control your cholesterol as told by your health care provider.  Manage your diabetes. ? Eating a healthy diet and exercising regularly are important parts of managing your blood sugar. If your blood sugar cannot be managed through diet and exercise, you may need to take medicines. ?  Take any prescribed medicines to control your diabetes as told by your health care provider.  Control your hypertension. ? To reduce your risk of stroke, try to keep your blood pressure below 130/80. ? Eating a healthy diet and exercising regularly are an important part of controlling your blood  pressure. If your blood pressure cannot be managed through diet and exercise, you may need to take medicines. ? Take any prescribed medicines to control hypertension as told by your health care provider. ? Ask your health care provider if you should monitor your blood pressure at home. ? Have your blood pressure checked every year, even if your blood pressure is normal. Blood pressure increases with age and some medical conditions.  Get evaluated for sleep disorders (sleep apnea). Talk to your health care provider about getting a sleep evaluation if you snore a lot or have excessive sleepiness.  Take over-the-counter and prescription medicines only as told by your health care provider. Aspirin or blood thinners (antiplatelets or anticoagulants) may be recommended to reduce your risk of forming blood clots that can lead to stroke.  Make sure that any other medical conditions you have, such as atrial fibrillation or atherosclerosis, are managed. What are the warning signs of a stroke? The warning signs of a stroke can be easily remembered as BEFAST.  B is for balance. Signs include: ? Dizziness. ? Loss of balance or coordination. ? Sudden trouble walking.  E is for eyes. Signs include: ? A sudden change in vision. ? Trouble seeing.  F is for face. Signs include: ? Sudden weakness or numbness of the face. ? The face or eyelid drooping to one side.  A is for arms. Signs include: ? Sudden weakness or numbness of the arm, usually on one side of the body.  S is for speech. Signs include: ? Trouble speaking (aphasia). ? Trouble understanding.  T is for time. ? These symptoms may represent a serious problem that is an emergency. Do not wait to see if the symptoms will go away. Get medical help right away. Call your local emergency services (911 in the [place] Do not drive yourself to the hospital.  Other signs of stroke may include: ? A sudden, severe headache with no known cause. ?  Nausea or vomiting. ? Seizure. Where to find more information For more information, visit:  American Stroke Association: www.strokeassociation.org  National Stroke Association: www.stroke.org Summary  You can prevent a stroke by eating healthy, exercising, not smoking, limiting alcohol intake, and managing any medical conditions you may have.  Do not use any products that contain nicotine or tobacco, such as cigarettes and e-cigarettes. If you need help quitting, ask your health care provider. It may also be helpful to avoid exposure to secondhand smoke.  Remember BEFAST for warning signs of stroke. Get help right away if you or a loved one has any of these signs. This information is not intended to replace advice given to you by your health care provider. Make sure you discuss any questions you have with your health care provider. Document Released: 12/27/2004 Document Revised: 11/01/2017 Document Reviewed: 12/25/2016 Elsevier Patient Education  2020 [employer].

## 2019-10-14 MED FILL — SPIRONOLACTONE 25 MG TABLET: 25 | 30 days supply | Qty: 30 | Fill #2

## 2019-12-14 ENCOUNTER — Telehealth: Payer: Self-pay

## 2019-12-14 ENCOUNTER — Ambulatory Visit: Payer: Medicare HMO | Admitting: Adult Health

## 2019-12-14 NOTE — Telephone Encounter (Signed)
Called pt on behalf of NP Ihor Austin. Provider is currently ooo and asked for [place] to r/s or convert her afternoon appts.   Pt chose to r/s his appt.

## 2019-12-25 ENCOUNTER — Other Ambulatory Visit: Payer: Self-pay | Admitting: Nurse Practitioner

## 2019-12-25 ENCOUNTER — Encounter: Payer: Self-pay | Admitting: Nurse Practitioner

## 2019-12-25 ENCOUNTER — Ambulatory Visit: Payer: Medicare HMO | Attending: Nurse Practitioner | Admitting: Nurse Practitioner

## 2019-12-25 ENCOUNTER — Other Ambulatory Visit: Payer: Self-pay

## 2019-12-25 DIAGNOSIS — F17200 Nicotine dependence, unspecified, uncomplicated: Secondary | ICD-10-CM

## 2019-12-25 DIAGNOSIS — I10 Essential (primary) hypertension: Secondary | ICD-10-CM

## 2019-12-25 DIAGNOSIS — Z13220 Encounter for screening for lipoid disorders: Secondary | ICD-10-CM

## 2019-12-25 DIAGNOSIS — E782 Mixed hyperlipidemia: Secondary | ICD-10-CM

## 2019-12-25 DIAGNOSIS — Z131 Encounter for screening for diabetes mellitus: Secondary | ICD-10-CM

## 2019-12-25 DIAGNOSIS — F17210 Nicotine dependence, cigarettes, uncomplicated: Secondary | ICD-10-CM

## 2019-12-25 DIAGNOSIS — Z130 Encounter for screening for diseases of the blood and blood-forming organs and certain disorders involving the immune mechanism: Secondary | ICD-10-CM

## 2019-12-25 MED ORDER — ATORVASTATIN CALCIUM 20 MG PO TABS: 20.0000 mg | ORAL_TABLET | Freq: Every day | ORAL | 2 refills | Status: DC

## 2019-12-25 MED ORDER — LOSARTAN POTASSIUM 50 MG PO TABS: 50.0000 mg | ORAL_TABLET | Freq: Every day | ORAL | 1 refills | Status: DC

## 2019-12-25 MED ORDER — ASPIRIN EC 81 MG PO TBEC: 81.0000 mg | DELAYED_RELEASE_TABLET | Freq: Every day | ORAL | 1 refills | Status: DC

## 2019-12-25 MED ORDER — AMLODIPINE BESYLATE 10 MG PO TABS: 10.0000 mg | ORAL_TABLET | Freq: Every day | ORAL | 1 refills | Status: DC

## 2019-12-25 MED ORDER — SPIRONOLACTONE 25 MG PO TABS: 25.0000 mg | ORAL_TABLET | Freq: Every day | ORAL | 1 refills | Status: DC

## 2019-12-25 MED FILL — AMLODIPINE BESYLATE 10 MG T: 10 | 90 days supply | Qty: 90 | Fill #0

## 2019-12-25 MED FILL — LOSARTAN POTASSIUM 50 MG TA: 50 | 90 days supply | Qty: 90 | Fill #0

## 2019-12-25 MED FILL — ATORVASTATIN CALCIUM 20 MG: 20 | 90 days supply | Qty: 90 | Fill #0

## 2019-12-25 MED FILL — SPIRONOLACTONE 25 MG TABLET: 25 | 90 days supply | Qty: 90 | Fill #0

## 2019-12-25 NOTE — Progress Notes (Signed)
Virtual Visit via Telephone Note Due to national recommendations of social distancing due to COVID 19, telehealth visit is felt to be most appropriate for this patient at this time.  I discussed the limitations, risks, security and privacy concerns of performing an evaluation and management service by telephone and the availability of in person appointments. I also discussed with the patient that there may be a patient responsible charge related to this service. The patient expressed understanding and agreed to proceed.    I connected with James Riley on 12/25/19  at   9:10 AM EST  EDT by telephone and verified that I am speaking with the correct person using two identifiers.   Consent I discussed the limitations, risks, security and privacy concerns of performing an evaluation and management service by telephone and the availability of in person appointments. I also discussed with the patient that there may be a patient responsible charge related to this service. The patient expressed understanding and agreed to proceed.   Location of Patient: Private Residence   Location of Provider: [facility]    Persons participating in Telemedicine visit: James Denver FNP-BC YY Corona CMA James Riley    History of Present Illness: Telemedicine visit for: F/U  has a past medical history of Hypercholesterolemia, Hypertension, and Stroke (HCC) (~ 1989).   Essential Hypertension Not well controlled however he is out of his spironolactone 25 mg daily. Will refill today. Current medications include: amlodipine 10 mg , losartan 50 mg. He does not monitor his blood pressure at home although he does have a blood pressure device at home. I have recommended that he take his blood pressure a few times per week. Denies chest pain, shortness of breath, palpitations, lightheadedness, dizziness, headaches or BLE edema.  BP Readings from Last 3 Encounters:  09/10/19 (!)  148/75  07/28/19 130/69  06/04/19 (!) 132/58   Hyperlipidemia Patient presents for follow up to hyperlipidemia. LDL at goal of <100.  He is medication compliant taking atorvastatin 20 mg daily. Marland Kitchen He is not consistently diet compliant and denies statin intolerance including myalgias.  Lab Results  Component Value Date   CHOL 111 06/04/2019   Lab Results  Component Value Date   HDL 34 (L) 06/04/2019   Lab Results  Component Value Date   LDLCALC 60 06/04/2019   Lab Results  Component Value Date   TRIG 86 06/04/2019   Lab Results  Component Value Date   CHOLHDL 3.3 06/04/2019     Past Medical History:  Diagnosis Date  . Hypercholesterolemia   . Hypertension   . Stroke [facility]) ~ 1989   "little weak on my right side since" (09/10/2016)    Past Surgical History:  Procedure Laterality Date  . APPENDECTOMY    . LAPAROSCOPIC APPENDECTOMY N/A 09/07/2016   Procedure: APPENDECTOMY LAPAROSCOPIC converted to open appendectomy;  Surgeon: Abigail Miyamoto, MD;  Location: [facility] OR;  Service: General;  Laterality: N/A;    Family History  Problem Relation Age of Onset  . Diabetes Neg Hx   . Hypertension Neg Hx     Social History   Socioeconomic History  . Marital status: Divorced    Spouse name: Not on file  . Number of children: Not on file  . Years of education: Not on file  . Highest education level: Not on file  Occupational History  . Not on file  Tobacco Use  . Smoking status: Current Every Day Smoker    Packs/day: 1.00  Years: 50.00    Pack years: 50.00    Types: Cigarettes  . Smokeless tobacco: Never Used  Substance and Sexual Activity  . Alcohol use: Not Currently    Alcohol/week: 1.0 standard drinks    Types: 1 Cans of beer per week    Comment: a beer once a week. As of July 2020 reports no alcohol at all  . Drug use: No  . Sexual activity: Not Currently  Other Topics Concern  . Not on file  Social History Narrative  . Not on file   Social Determinants of  Health   Financial Resource Strain:   . Difficulty of Paying Living Expenses: Not on file  Food Insecurity:   . Worried About Charity fundraiser in the Last Year: Not on file  . Ran Out of Food in the Last Year: Not on file  Transportation Needs:   . Lack of Transportation (Medical): Not on file  . Lack of Transportation (Non-Medical): Not on file  Physical Activity:   . Days of Exercise per Week: Not on file  . Minutes of Exercise per Session: Not on file  Stress:   . Feeling of Stress : Not on file  Social Connections:   . Frequency of Communication with Friends and Family: Not on file  . Frequency of Social Gatherings with Friends and Family: Not on file  . Attends Religious Services: Not on file  . Active Member of Clubs or Organizations: Not on file  . Attends Archivist Meetings: Not on file  . Marital Status: Not on file     Observations/Objective: Awake, alert and oriented x 3   Review of Systems  Constitutional: Negative for fever, malaise/fatigue and weight loss.  HENT: Negative.  Negative for nosebleeds.   Eyes: Negative.  Negative for blurred vision, double vision and photophobia.  Respiratory: Negative.  Negative for cough and shortness of breath.   Cardiovascular: Negative.  Negative for chest pain, palpitations and leg swelling.  Gastrointestinal: Negative.  Negative for heartburn, nausea and vomiting.  Musculoskeletal: Negative.  Negative for myalgias.  Neurological: Negative.  Negative for dizziness, focal weakness, seizures and headaches.  Psychiatric/Behavioral: Negative.  Negative for suicidal ideas.  [name] was seen today for follow-up.  Diagnoses and all orders for this visit:  Essential hypertension -     losartan (COZAAR) 50 MG tablet; Take 1 tablet (50 mg total) by mouth daily. -     amLODipine (NORVASC) 10 MG tablet; Take 1 tablet (10 mg total) by mouth daily. -     spironolactone (ALDACTONE) 25 MG tablet; Take 1 tablet (25 mg total)  by mouth daily. Continue all antihypertensives as prescribed.  Remember to bring in your blood pressure log with you for your follow up appointment.  DASH/Mediterranean Diets are healthier choices for HTN.   Mixed hyperlipidemia -     atorvastatin (LIPITOR) 20 MG tablet; Take 1 tablet (20 mg total) by mouth daily. INSTRUCTIONS: Work on a low fat, heart healthy diet and participate in regular aerobic exercise program by working out at least 150 minutes per week; 5 days a week-30 minutes per day. Avoid red meat/beef/steak,  fried foods. junk foods, sodas, sugary drinks, unhealthy snacking, alcohol and smoking.  Drink at least 80 oz of water per day and monitor your carbohydrate intake daily.   Tobacco dependence Aragon was counseled on the dangers of tobacco use, and was advised to quit. Reviewed strategies to maximize success, including removing cigarettes and smoking materials from  environment, stress management and support of family/friends as well as pharmacological alternatives including: Wellbutrin, Chantix, Nicotine patch, Nicotine gum or lozenges. Smoking cessation support: smoking cessation hotline: 1-800-QUIT-NOW.  Smoking cessation classes are also available through [facility]. Call [phone] or visit our website at [email or web address].   A total of 13 minutes was spent on counseling for smoking cessation and Scout is ready to quit.   Other orders -     aspirin EC 81 MG tablet; Take 1 tablet (81 mg total) by mouth daily.   Follow Up Instructions Return in about 3 months (around 03/24/2020).     I discussed the assessment and treatment plan with the patient. The patient was provided an opportunity to ask questions and all were answered. The patient agreed with the plan and demonstrated an understanding of the instructions.   The patient was advised to call back or seek an in-person evaluation if the symptoms worsen or if the condition fails to improve as  anticipated.  I provided 14 minutes of non-face-to-face time during this encounter including median intraservice time, reviewing previous notes, labs, imaging, medications and explaining diagnosis and management.  Claiborne Rigg, FNP-BC

## 2019-12-26 LAB — LIPID PANEL
Chol/HDL Ratio: 3.9 ratio (ref 0.0–5.0)
Cholesterol, Total: 144 mg/dL (ref 100–199)
HDL: 37 mg/dL — ABNORMAL LOW (ref >39)
LDL Chol Calc (NIH): 86 mg/dL (ref 0–99)
Triglycerides: 115 mg/dL (ref 0–149)
VLDL Cholesterol Cal: 21 mg/dL (ref 5–40)

## 2019-12-26 LAB — CMP14+EGFR
ALT: 21 IU/L (ref 0–44)
AST: 20 IU/L (ref 0–40)
Albumin/Globulin Ratio: 1.6 (ref 1.2–2.2)
Albumin: 4.9 g/dL — ABNORMAL HIGH (ref 3.8–4.8)
Alkaline Phosphatase: 108 IU/L (ref 39–117)
BUN/Creatinine Ratio: 8 — ABNORMAL LOW (ref 10–24)
BUN: 9 mg/dL (ref 8–27)
Bilirubin Total: 0.6 mg/dL (ref 0.0–1.2)
CO2: 21 mmol/L (ref 20–29)
Calcium: 10.1 mg/dL (ref 8.6–10.2)
Chloride: 105 mmol/L (ref 96–106)
Creatinine, Ser: 1.08 mg/dL (ref 0.76–1.27)
GFR calc Af Amer: 81 mL/min/{1.73_m2} (ref >59)
GFR calc non Af Amer: 70 mL/min/{1.73_m2} (ref >59)
Globulin, Total: 3 g/dL (ref 1.5–4.5)
Glucose: 104 mg/dL — ABNORMAL HIGH (ref 65–99)
Potassium: 3.9 mmol/L (ref 3.5–5.2)
Sodium: 141 mmol/L (ref 134–144)
Total Protein: 7.9 g/dL (ref 6.0–8.5)

## 2019-12-26 LAB — HEMOGLOBIN A1C
Est. average glucose Bld gHb Est-mCnc: 123 mg/dL
Hgb A1c MFr Bld: 5.9 % — ABNORMAL HIGH (ref 4.8–5.6)

## 2020-01-04 NOTE — Progress Notes (Signed)
[facility] [address] [place]. [place] [address] [phone]       OFFICE FOLLOW UP NOTE  James Riley Date of Birth:  [birth year] Medical Record Number:  [id number]   Referring MD: James Riley  Reason for Referral: Stroke  Chief Complaint  Patient presents with  . Follow-up    TX rM. Alone. No questions nor concerns      HPI:  Initial visit 09/10/2019 Dr. Pearlean Riley: James Riley is a 69 year old African-American male seen today for initial office consultation visit for stroke.  History is obtained from the patient, review of electronic medical records and have personally reviewed imaging films in PACS.  He has a past medical history of hypertension, hyperlipidemia, prediabetes and remote left pontine stroke with residual mild right-sided weakness since 2011.  He was seen in the emergency room at [facility] on 06/03/2019 with symptoms beginning the day prior.  He complained of generalized weakness dizziness lightheadedness and some difficulty walking.  Due to multitude of complaints an MRI scan of the brain was obtained which showed a small area of restricted diffusion in the right posterior frontal white matter which was felt to be an acute infarct.  Patient denied any specific symptoms of slurred speech left facial weakness of left arm or body weakness.  He admitted that he must have been dehydrated as he had not been eating and drinking well and is unclear whether his increased right-sided weakness and gait difficulties were worsening of his old stroke due to dehydration.  CT angiogram of the brain and neck were obtained which showed no significant large vessel stenosis or occlusion.  A 2 mm outpouching from supraclinoid left ICA was noted which likely represented a infundibulum rather than an aneurysm.  The patient refused to get a 2D echo as he wanted to go home.  LDL cholesterol was 60 mg percent and hemoglobin A1c was 5.6.  Patient was not on any  antithrombotics prior to admission and was placed on aspirin and Plavix which also apparently has not been taking.  He states is done well his gait and walking are back to baseline.  His blood pressure seems well controlled and today it is 148/75.  Patient was counseled to quit smoking but states he has cut back a bit but is still smoking.  Is tolerating Lipitor well without muscle aches and pains.  He has no new complaints.  Update 01/05/2020: James Riley is a 69 year old male who is being seen today for stroke follow-up.  He has been doing well from a stroke standpoint without new or reoccurring stroke/TIA symptoms.  He does continue to have residual mild RUE weakness from prior stroke but denies worsening.  He continues to live independently maintaining all ADLs and IADLs without difficulty.  Previously on aspirin 81 mg daily but apparently patient discontinued this at some point as he did not feel this was needed or he was unable to pick up prescription (confusing reasoning).  He is agreeable to restart if a new prescription is sent in (Per epic review, PCP recently sent in refill on 12/25/2019 but then was discontinued by PCP today).  Denies bleeding or bruising while on aspirin.  Continues on atorvastatin 20 mg daily for secondary stroke prevention without side effects.  Recent lipid panel on 12/25/2019 showed LDL 86.  Blood pressure today 138/76. He does not routinely monitor blood pressure at home.  He does endorse ongoing compliance with all prescribed antihypertensives.  Ongoing tobacco use but  he is unsure daily amount as he will frequently get out his cigarettes to friends and neighbors.  He is not interested in quitting at this time despite knowing risks.  Previously on Viagra prescribed by PCP and is questioning if he can continue ongoing.  He was not able to pick up additional refills and is questioning reasoning.  No further concerns at this time.         ROS:   14 system review of systems is  positive for RUE weakness and all other systems negative  PMH:  Past Medical History:  Diagnosis Date  . Hypercholesterolemia   . Hypertension   . Stroke [facility]) ~ 1989   "little weak on my right side since" (09/10/2016)    Social History:  Social History   Socioeconomic History  . Marital status: Divorced    Spouse name: Not on file  . Number of children: Not on file  . Years of education: Not on file  . Highest education level: Not on file  Occupational History  . Not on file  Tobacco Use  . Smoking status: Current Every Day Smoker    Packs/day: 1.00    Years: 50.00    Pack years: 50.00    Types: Cigarettes  . Smokeless tobacco: Never Used  Substance and Sexual Activity  . Alcohol use: Not Currently    Alcohol/week: 1.0 standard drinks    Types: 1 Cans of beer per week    Comment: a beer once a week. As of July 2020 reports no alcohol at all  . Drug use: No  . Sexual activity: Not Currently  Other Topics Concern  . Not on file  Social History Narrative  . Not on file   Social Determinants of Health   Financial Resource Strain:   . Difficulty of Paying Living Expenses: Not on file  Food Insecurity:   . Worried About Programme researcher, broadcasting/film/video in the Last Year: Not on file  . Ran Out of Food in the Last Year: Not on file  Transportation Needs:   . Lack of Transportation (Medical): Not on file  . Lack of Transportation (Non-Medical): Not on file  Physical Activity:   . Days of Exercise per Week: Not on file  . Minutes of Exercise per Session: Not on file  Stress:   . Feeling of Stress : Not on file  Social Connections:   . Frequency of Communication with Friends and Family: Not on file  . Frequency of Social Gatherings with Friends and Family: Not on file  . Attends Religious Services: Not on file  . Active Member of Clubs or Organizations: Not on file  . Attends Banker Meetings: Not on file  . Marital Status: Not on file  Intimate Partner Violence:     . Fear of Current or Ex-Partner: Not on file  . Emotionally Abused: Not on file  . Physically Abused: Not on file  . Sexually Abused: Not on file    Medications:   Current Outpatient Medications on File Prior to Visit  Medication Sig Dispense Refill  . amLODipine (NORVASC) 10 MG tablet Take 1 tablet (10 mg total) by mouth daily. 90 tablet 1  . atorvastatin (LIPITOR) 20 MG tablet Take 1 tablet (20 mg total) by mouth daily. 90 tablet 2  . losartan (COZAAR) 50 MG tablet Take 1 tablet (50 mg total) by mouth daily. 90 tablet 1  . spironolactone (ALDACTONE) 25 MG tablet Take 1 tablet (25 mg total)  by mouth daily. 90 tablet 1   No current facility-administered medications on file prior to visit.    Allergies:  No Known Allergies  Physical Exam  Today's Vitals   01/05/20 0748  BP: 138/76  Pulse: 84  Temp: (!) 97.2 F (36.2 C)  Weight: 192 lb (87.1 kg)  Height: 5\' 10"  (1.778 m)   Body mass index is 27.55 kg/m.  General: well developed, well nourished pleasant middle-aged African-American male, seated, in no evident distress Head: head normocephalic and atraumatic.   Neck: supple with no carotid or supraclavicular bruits Cardiovascular: regular rate and rhythm, no murmurs Musculoskeletal: no deformity Skin:  no rash/petichiae Vascular:  Normal pulses all extremities  Neurologic Exam Mental Status: Awake and fully alert. Oriented to place and time. Recent and remote memory intact. Attention span, concentration and fund of knowledge appropriate. Mood and affect appropriate.  Cranial Nerves: Pupils equal, briskly reactive to light. Extraocular movements full without nystagmus. Visual fields full to confrontation. Hearing intact. Facial sensation intact.  Mild right nasolabial fold asymmetry.  Tongue, palate moves normally and symmetrically.  Motor: Normal bulk and tone. Normal strength in all tested extremity muscles except diminished fine finger movements on the right and mild right  grip weakness (chronic).   Sensory.: intact to touch , pinprick , position and vibratory sensation.  Coordination: Rapid alternating movements normal in all extremities except slightly diminished right hand. Finger-to-nose and heel-to-shin performed accurately bilaterally. Gait and Station: Arises from chair without difficulty. Stance is normal. Gait demonstrates normal stride length and balance . Able to heel, toe and tandem walk with slight difficulty.  Reflexes: 1+ and symmetric. Toes downgoing.        ASSESSMENT: 69 year old African-American male with right frontal white matter lacunar infarct in July 2020 due to small vessel disease.  Prior stroke in 2011 with residual RUE weakness vascular risk factors of hypertension, old stroke, tobacco use and hyperlipidemia.  Recovered well from recent stroke without residual deficits and prior stroke deficits at baseline with mild RUE weakness.     Riley:  1. Right frontal WM lacunar stroke: Restart aspirin 81 mg daily  And continue atorvastatin 20 mg daily for secondary stroke prevention.  Discussion regarding importance of lifelong use of aspirin 81 mg daily for secondary stroke prevention.  Did provide patient with refill but request ongoing refills by PCP.  Maintain strict control of hypertension with blood pressure goal below 130/90, diabetes with hemoglobin A1c goal below 6.5% and cholesterol with LDL cholesterol (bad cholesterol) goal below 70 mg/dL.  I also advised the patient to eat a healthy diet with plenty of whole grains, cereals, fruits and vegetables, exercise regularly with at least 30 minutes of continuous activity daily and maintain ideal body weight. 2. HTN: Advised to continue current treatment regimen.  Today's BP stable.  Advised to monitor at home to ensure satisfactory levels and ongoing follow-up with PCP along with ensuring compliance with all prescribed antihypertensives 3. HLD: Advised to continue current treatment regimen  along with continued follow-up with PCP for future prescribing and monitoring of lipid panel 4. Prediabetes: Recent A1c 5.9.  Highly encouraged increasing activity level and to ensure healthy diet 5. Tobaccouse: Discussion regarding importance of smoking cessation.  He does verbalize understanding of increased risk with continued tobacco use but is not willing to quit at this time.  He was advised that there are many treatment options to help with cessation and to speak with PCP once he is ready 6. Erectile dysfunction: Chronic  history with prior use of Viagra.  Advised him that he will be important to monitor blood pressure at home and as long as blood pressure stays within normal limits, no concern from a stroke standpoint with ongoing use of occasional Viagra when needed.  Provided 3 months refill (10 tablets/month) but request and advised patient ongoing management and refills by PCP  Follow-up in 6 months or call earlier   Greater than 50% time during this 30-minute visit was spent on counseling and coordination of care about lacunar stroke and prior stroke with residual deficit, discussion regarding importance of managing stroke risk factors and importance of tobacco cessation, and answered all questions to patient satisfaction   Frann Rider, AGNP-BC  [facility] [address] [place], [place] [address]  Phone [phone] Fax [phone] Note: This document was prepared with digital dictation and possible smart phrase technology. Any transcriptional errors that result from this process are unintentional.

## 2020-01-05 ENCOUNTER — Encounter: Payer: Self-pay | Admitting: Adult Health

## 2020-01-05 ENCOUNTER — Ambulatory Visit: Payer: Medicare HMO | Admitting: Adult Health

## 2020-01-05 ENCOUNTER — Other Ambulatory Visit: Payer: Self-pay

## 2020-01-05 VITALS — BP 138/76 | HR 84 | Temp 97.2°F | Ht 70.0 in | Wt 192.0 lb

## 2020-01-05 DIAGNOSIS — Z8673 Personal history of transient ischemic attack (TIA), and cerebral infarction without residual deficits: Secondary | ICD-10-CM

## 2020-01-05 DIAGNOSIS — I10 Essential (primary) hypertension: Secondary | ICD-10-CM | POA: Diagnosis not present

## 2020-01-05 DIAGNOSIS — F17200 Nicotine dependence, unspecified, uncomplicated: Secondary | ICD-10-CM

## 2020-01-05 DIAGNOSIS — R7303 Prediabetes: Secondary | ICD-10-CM

## 2020-01-05 DIAGNOSIS — E785 Hyperlipidemia, unspecified: Secondary | ICD-10-CM | POA: Diagnosis not present

## 2020-01-05 DIAGNOSIS — N529 Male erectile dysfunction, unspecified: Secondary | ICD-10-CM

## 2020-01-05 MED ORDER — ASPIRIN EC 81 MG PO TBEC: 81.0000 mg | DELAYED_RELEASE_TABLET | Freq: Every day | ORAL | 2 refills | Status: DC

## 2020-01-05 MED ORDER — SILDENAFIL CITRATE 100 MG PO TABS: 100.0000 mg | ORAL_TABLET | Freq: Every day | ORAL | 3 refills | Status: DC | PRN

## 2020-01-05 NOTE — Patient Instructions (Addendum)
Restart aspirin 81 mg daily  and atorvastatin 20 mg daily for secondary stroke prevention  Continue to follow up with PCP regarding cholesterol and blood pressure management   Continue to monitor blood pressure at home  Highly encouraged complete smoking cessation due to increased risk of recurrent stroke  Maintain strict control of hypertension with blood pressure goal below 130/90, diabetes with hemoglobin A1c goal below 6.5% and cholesterol with LDL cholesterol (bad cholesterol) goal below 70 mg/dL. I also advised the patient to eat a healthy diet with plenty of whole grains, cereals, fruits and vegetables, exercise regularly and maintain ideal body weight.  Followup in the future with me in 6 months or call earlier if needed       Thank you for coming to see [place] at [facility]. I hope we have been able to provide you high quality care today.  You may receive a patient satisfaction survey over the next few weeks. We would appreciate your feedback and comments so that we may continue to improve ourselves and the health of our patients.

## 2020-01-05 NOTE — Progress Notes (Signed)
Fax confirmation received for viagra 100mg  #10 with 3 refills. HT [place] / [place].

## 2020-01-05 NOTE — Progress Notes (Signed)
I agree with the above plan 

## 2020-01-13 IMAGING — CT CT ANGIOGRAPHY HEAD
3 of 7 series · 10 of 36 positions shown · IV contrast (OMNI 350)
Comparison: Brain MRI from earlier today

CLINICAL DATA: Stroke follow-up

EXAM:
CT ANGIOGRAPHY HEAD AND NECK
TECHNIQUE: Multidetector CT imaging of the head and neck was performed using
the standard protocol during bolus administration of intravenous
contrast. Multiplanar CT image reconstructions and MIPs were
obtained to evaluate the vascular anatomy. Carotid stenosis
measurements (when applicable) are obtained utilizing NASCET
criteria, using the distal internal carotid diameter as the
denominator.
CONTRAST:  75mL OMNIPAQUE IOHEXOL 350 MG/ML SOLN

[Series 5: cta neck · axial · 0.48mm/px · z∈[-181,-65]mm · 2 of 175 slices shown]
[im 59/175  soft-tissue]
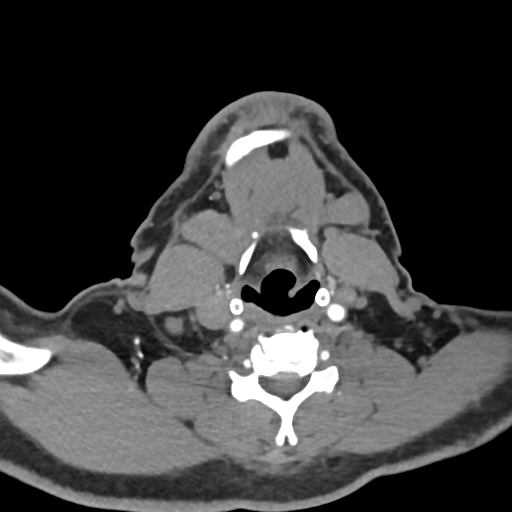
[im 117/175  bone]
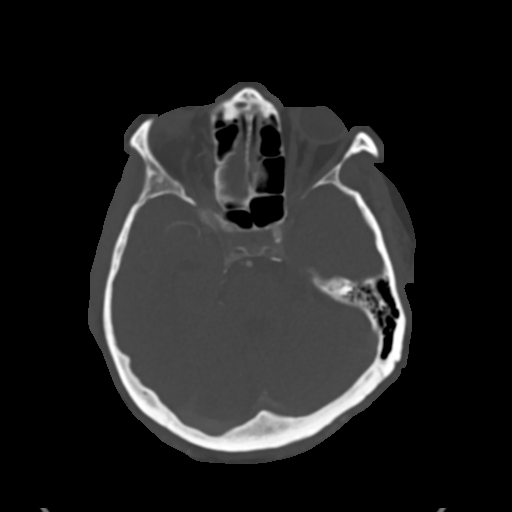

[Series 7: cta neck axial · axial · 0.39mm/px · z∈[-277,-35]mm · 6 of 348 slices shown]
[im 50/348  soft-tissue]
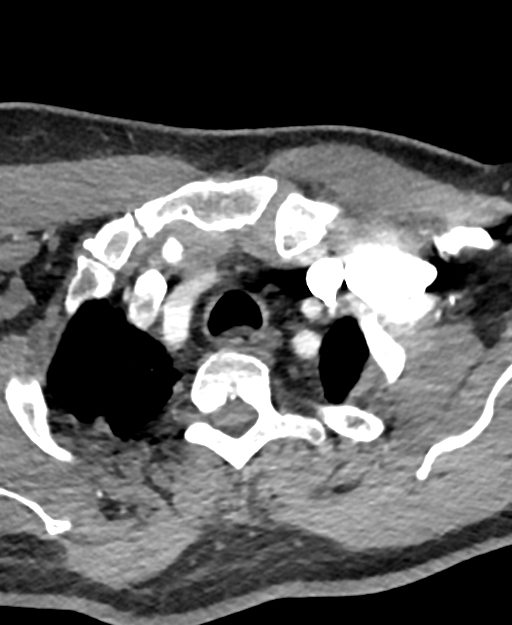
[im 100/348  soft-tissue]
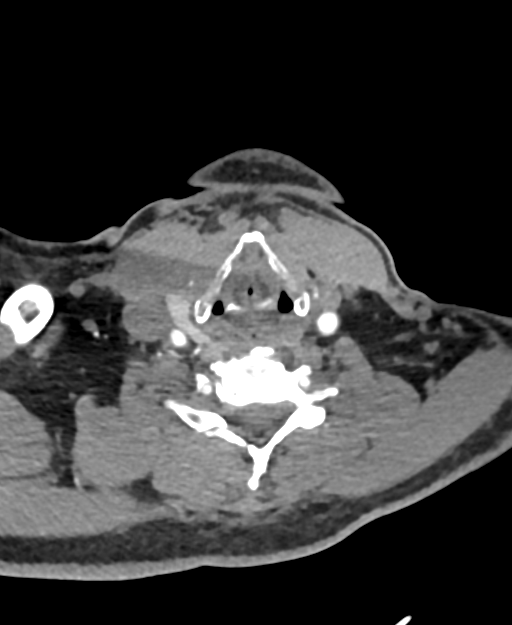
[im 149/348  soft-tissue]
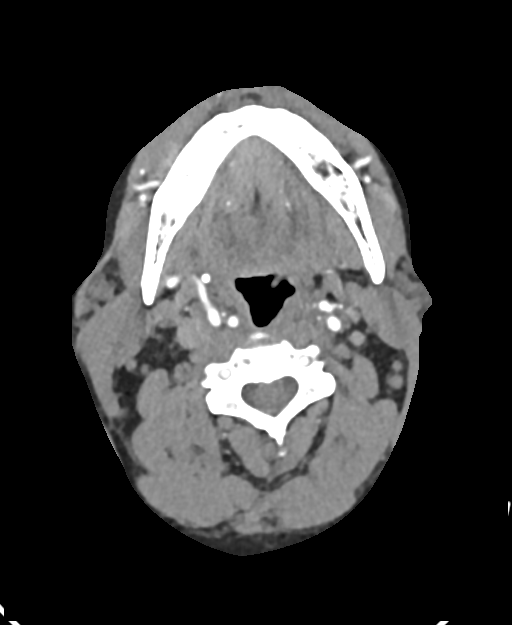
[im 199/348  soft-tissue]
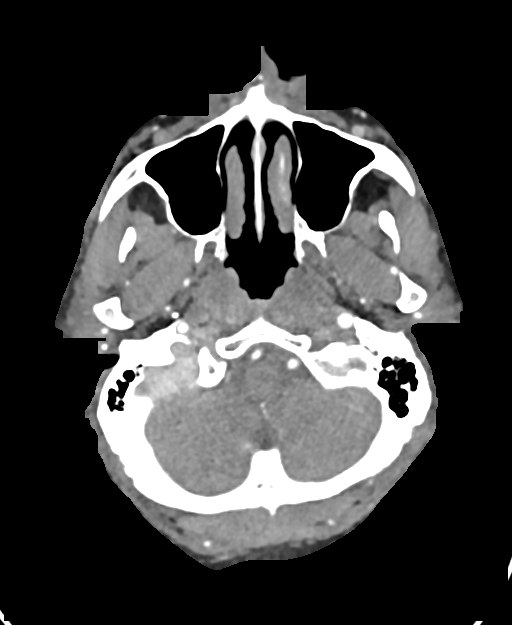
[im 248/348  soft-tissue]
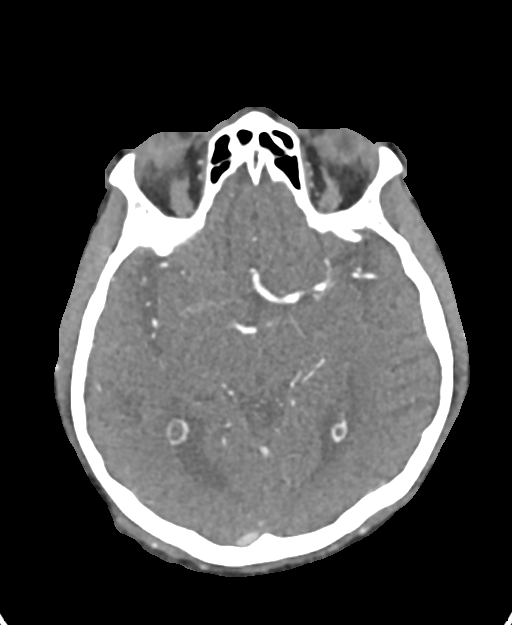
[im 298/348  soft-tissue]
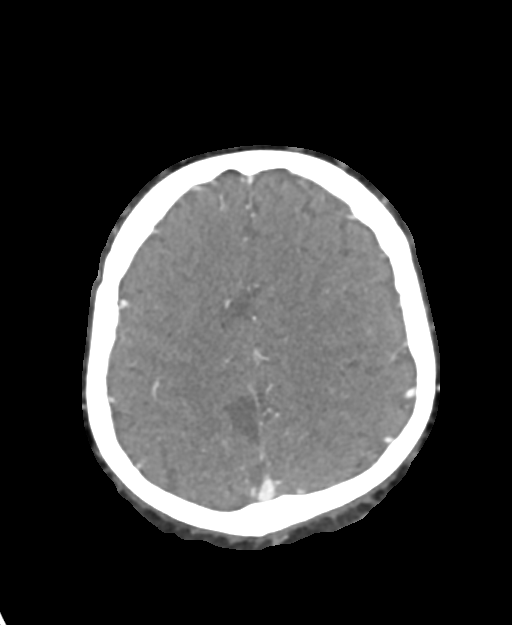

[Series 9: cta neck sagittal · sagittal · 0.46mm/px · 2 of 201 slices shown]
[im 71/201  soft-tissue]
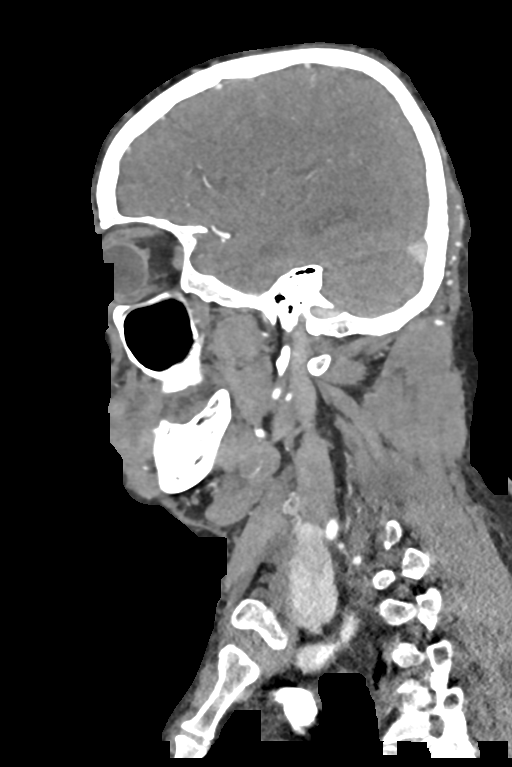
[im 131/201  soft-tissue]
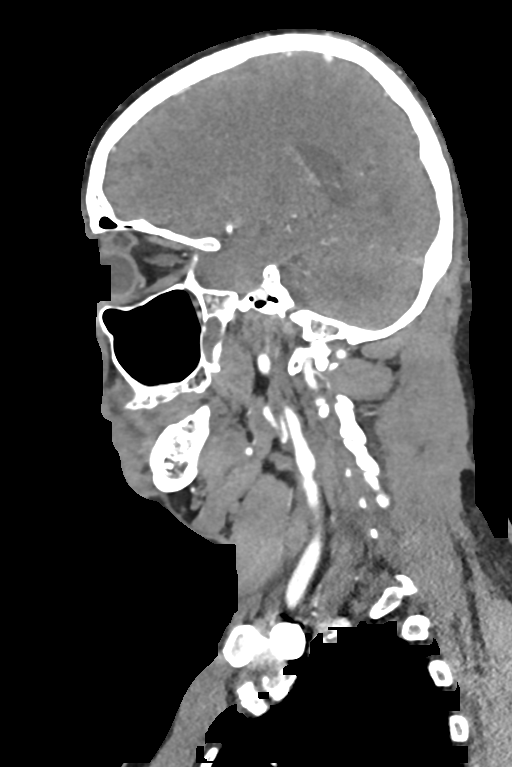

[10 of 36 positions shown; findings below may reference images not displayed]

FINDINGS: CTA NECK FINDINGS

Aortic arch: 2 vessel branching.

Right carotid system: Vessels are smooth and diffusely patent. No
atheromatous calcification.

Left carotid system: Mild atheromatous wall thickening of the common
carotid and at the ICA bulb. No flow limiting stenosis or
ulceration.

Vertebral arteries: No proximal subclavian stenosis. Mild to
moderate narrowing at the origin of the left vertebral artery due to
low-density plaque. No flow limiting stenosis seen throughout the
vertebral arteries.

Skeleton: No acute or aggressive finding. Left simple lipoma
associated with the left trapezius.

Other neck: Negative.  No incidental mass or inflammation.

Upper chest: Negative

Review of the MIP images confirms the above findings

CTA HEAD FINDINGS

Anterior circulation: Atheromatous plaque on the carotid siphons.
Left ICA is larger than the right due to right A1 hypoplasia.
Negative for branch occlusion or flow limiting stenosis. Conical
outpouching from the supraclinoid left ICA directed medially and
inferiorly measuring 2 mm in maximum.

Posterior circulation: Codominant vertebral arteries. The
vertebrobasilar arteries are smooth and widely patent. No branch
occlusion, aneurysm, or flow limiting stenosis. Atheromatous
irregularity of the proximal PCAs.

Venous sinuses: Patent

Anatomic variants: None significant

Delayed phase: Not obtained

Review of the MIP images confirms the above findings
IMPRESSION: 1. No emergent vascular finding. There is atherosclerosis without
flow limiting stenosis of major intracranial or cervical vessels.
2. Mild-to-moderate left vertebral origin stenosis.
3. 2 mm outpouching from the supraclinoid left ICA, shape favoring
infundibulum over aneurysm.

## 2020-03-17 ENCOUNTER — Ambulatory Visit: Payer: Medicare HMO | Admitting: Nurse Practitioner

## 2020-03-18 ENCOUNTER — Ambulatory Visit: Payer: Medicare HMO | Admitting: Nurse Practitioner

## 2020-03-18 ENCOUNTER — Ambulatory Visit: Payer: Medicare HMO | Attending: Nurse Practitioner | Admitting: Nurse Practitioner

## 2020-03-18 ENCOUNTER — Encounter: Payer: Self-pay | Admitting: Nurse Practitioner

## 2020-03-18 ENCOUNTER — Other Ambulatory Visit: Payer: Self-pay

## 2020-03-18 VITALS — BP 158/67 | HR 68 | Ht 70.0 in | Wt 195.0 lb

## 2020-03-18 DIAGNOSIS — I10 Essential (primary) hypertension: Secondary | ICD-10-CM | POA: Diagnosis not present

## 2020-03-18 DIAGNOSIS — N529 Male erectile dysfunction, unspecified: Secondary | ICD-10-CM

## 2020-03-18 DIAGNOSIS — F17200 Nicotine dependence, unspecified, uncomplicated: Secondary | ICD-10-CM

## 2020-03-18 DIAGNOSIS — E782 Mixed hyperlipidemia: Secondary | ICD-10-CM

## 2020-03-18 MED ORDER — AMLODIPINE BESYLATE 10 MG PO TABS: 10.0000 mg | ORAL_TABLET | Freq: Every day | ORAL | 1 refills | Status: DC

## 2020-03-18 MED ORDER — LOSARTAN POTASSIUM 50 MG PO TABS: 50.0000 mg | ORAL_TABLET | Freq: Every day | ORAL | 1 refills | Status: DC

## 2020-03-18 MED ORDER — ASPIRIN EC 81 MG PO TBEC: 81.0000 mg | DELAYED_RELEASE_TABLET | Freq: Every day | ORAL | 2 refills | Status: DC

## 2020-03-18 MED ORDER — SPIRONOLACTONE 25 MG PO TABS: 25.0000 mg | ORAL_TABLET | Freq: Every day | ORAL | 1 refills | Status: DC

## 2020-03-18 MED ORDER — ATORVASTATIN CALCIUM 20 MG PO TABS: 20.0000 mg | ORAL_TABLET | Freq: Every day | ORAL | 2 refills | Status: DC

## 2020-03-18 MED FILL — LOSARTAN POTASSIUM 50 MG TA: 50 | 90 days supply | Qty: 90 | Fill #0

## 2020-03-18 MED FILL — AMLODIPINE BESYLATE 10 MG T: 10 | 90 days supply | Qty: 90 | Fill #0

## 2020-03-18 MED FILL — SPIRONOLACTONE 25 MG TABLET: 25 | 90 days supply | Qty: 90 | Fill #0

## 2020-03-18 MED FILL — ATORVASTATIN CALCIUM 20 MG: 20 | 90 days supply | Qty: 90 | Fill #0

## 2020-03-18 NOTE — Progress Notes (Signed)
Assessment & Plan:  James Riley was seen today for hypertension.  Diagnoses and all orders for this visit:  Essential hypertension -     CMP14+EGFR -     amLODipine (NORVASC) 10 MG tablet; Take 1 tablet (10 mg total) by mouth daily. -     aspirin EC 81 MG tablet; Take 1 tablet (81 mg total) by mouth daily. -     spironolactone (ALDACTONE) 25 MG tablet; Take 1 tablet (25 mg total) by mouth daily. -     losartan (COZAAR) 50 MG tablet; Take 1 tablet (50 mg total) by mouth daily. Continue all antihypertensives as prescribed.  Remember to bring in your blood pressure log with you for your follow up appointment.  DASH/Mediterranean Diets are healthier choices for HTN.   Mixed hyperlipidemia -     atorvastatin (LIPITOR) 20 MG tablet; Take 1 tablet (20 mg total) by mouth daily. INSTRUCTIONS: Work on a low fat, heart healthy diet and participate in regular aerobic exercise program by working out at least 150 minutes per week; 5 days a week-30 minutes per day. Avoid red meat/beef/steak,  fried foods. junk foods, sodas, sugary drinks, unhealthy snacking, alcohol and smoking.  Drink at least 80 oz of water per day and monitor your carbohydrate intake daily.    Erectile dysfunction, unspecified erectile dysfunction type -     Ambulatory referral to Urology Ineffective: Cialis and Viagra  Tobacco dependence James Riley was counseled on the dangers of tobacco use, and was advised to quit. Reviewed strategies to maximize success, including removing cigarettes and smoking materials from environment, stress management and support of family/friends as well as pharmacological alternatives including: Wellbutrin, Chantix, Nicotine patch, Nicotine gum or lozenges. Smoking cessation support: smoking cessation hotline: 1-800-QUIT-NOW.  Smoking cessation classes are also available through [facility]. Call 8120554400 or visit our website at [email or web address]   A total of 3 minutes was spent  on counseling for smoking cessation and James Riley is not ready to quit.     Patient has been counseled on age-appropriate routine health concerns for screening and prevention. These are reviewed and up-to-date. Referrals have been placed accordingly. Immunizations are up-to-date or declined.    Subjective:   Chief Complaint  Patient presents with  . Hypertension    [name] 69 y.o. male presents to office today for follow up.    Essential Hypertension Blood pressure not well controlled. However he did smoke several cigarettes prior to his office visit this morning. Denies chest pain, shortness of breath, palpitations, lightheadedness, dizziness, headaches or BLE edema.  Endorses medication compliance taking amlodipine 10 mg daily, losartan 50 mg daily and spironolactone 25 mg daily. BP Readings from Last 3 Encounters:  03/18/20 (!) 158/67  01/05/20 138/76  09/10/19 (!) 148/75    Dyslipidemia LDL at goal. Taking atorvastatin 20 mg daily as prescribed. Denies statin intolerance  Lab Results  Component Value Date   LDLCALC 86 12/25/2019    Erectile dysfunction PSA normal. Denies nocturia. Risk factors: smoking, HTN, overweight, diet.  Difficulty maintaining and achieving an erection. Failed viagra and cialis trials.   Review of Systems  Constitutional: Negative for fever, malaise/fatigue and weight loss.  HENT: Negative.  Negative for nosebleeds.   Eyes: Negative.  Negative for blurred vision, double vision and photophobia.  Respiratory: Negative.  Negative for cough and shortness of breath.   Cardiovascular: Negative.  Negative for chest pain, palpitations and leg swelling.  Gastrointestinal: Negative.  Negative for heartburn, nausea  and vomiting.  Genitourinary:       ED  Musculoskeletal: Negative.  Negative for myalgias.  Neurological: Negative.  Negative for dizziness, focal weakness, seizures and headaches.  Psychiatric/Behavioral: Negative.  Negative for  suicidal ideas.    Past Medical History:  Diagnosis Date  . Hypercholesterolemia   . Hypertension   . Stroke [facility]) ~ 1989   "little weak on my right side since" (09/10/2016)    Past Surgical History:  Procedure Laterality Date  . APPENDECTOMY    . LAPAROSCOPIC APPENDECTOMY N/A 09/07/2016   Procedure: APPENDECTOMY LAPAROSCOPIC converted to open appendectomy;  Surgeon: Coralie Keens, MD;  Location: [facility] OR;  Service: General;  Laterality: N/A;    Family History  Problem Relation Age of Onset  . Diabetes Neg Hx   . Hypertension Neg Hx     Social History Reviewed with no changes to be made today.   Outpatient Medications Prior to Visit  Medication Sig Dispense Refill  . amLODipine (NORVASC) 10 MG tablet Take 1 tablet (10 mg total) by mouth daily. 90 tablet 1  . aspirin EC 81 MG tablet Take 1 tablet (81 mg total) by mouth daily. 90 tablet 2  . atorvastatin (LIPITOR) 20 MG tablet Take 1 tablet (20 mg total) by mouth daily. 90 tablet 2  . losartan (COZAAR) 50 MG tablet Take 1 tablet (50 mg total) by mouth daily. 90 tablet 1  . spironolactone (ALDACTONE) 25 MG tablet Take 1 tablet (25 mg total) by mouth daily. 90 tablet 1  . sildenafil (VIAGRA) 100 MG tablet Take 1 tablet (100 mg total) by mouth daily as needed for erectile dysfunction. (Patient not taking: Reported on 03/18/2020) 10 tablet 3   No facility-administered medications prior to visit.    No Known Allergies     Objective:    BP (!) 158/67   Pulse 68   Ht '5\' 10"'  (1.778 m)   Wt 195 lb (88.5 kg)   SpO2 99%   BMI 27.98 kg/m  Wt Readings from Last 3 Encounters:  03/18/20 195 lb (88.5 kg)  01/05/20 192 lb (87.1 kg)  09/10/19 188 lb 9.6 oz (85.5 kg)    Physical Exam Vitals and nursing note reviewed.  Constitutional:      Appearance: He is well-developed.  HENT:     Head: Normocephalic and atraumatic.  Cardiovascular:     Rate and Rhythm: Normal rate and regular rhythm.     Heart sounds: Normal heart sounds.  No murmur. No friction rub. No gallop.   Pulmonary:     Effort: Pulmonary effort is normal. No tachypnea or respiratory distress.     Breath sounds: Normal breath sounds. No decreased breath sounds, wheezing, rhonchi or rales.  Chest:     Chest wall: No tenderness.  Abdominal:     General: Bowel sounds are normal.     Palpations: Abdomen is soft.  Musculoskeletal:        General: Normal range of motion.     Cervical back: Normal range of motion.  Skin:    General: Skin is warm and dry.  Neurological:     Mental Status: He is alert and oriented to person, place, and time.     Coordination: Coordination normal.     Comments: Currently denies any residual deficits from previous stroke.  Psychiatric:        Behavior: Behavior normal. Behavior is cooperative.        Thought Content: Thought content normal.  Judgment: Judgment normal.          Patient has been counseled extensively about nutrition and exercise as well as the importance of adherence with medications and regular follow-up. The patient was given clear instructions to go to ER or return to medical center if symptoms don't improve, worsen or new problems develop. The patient verbalized understanding.   Follow-up: Return in about 3 months (around 06/17/2020).   [name], FNP-BC [facility] [place], [place]   03/18/2020, 11:55 AM

## 2020-03-19 LAB — CMP14+EGFR
ALT: 14 IU/L (ref 0–44)
AST: 16 IU/L (ref 0–40)
Albumin/Globulin Ratio: 1.4 (ref 1.2–2.2)
Albumin: 4.6 g/dL (ref 3.8–4.8)
Alkaline Phosphatase: 112 IU/L (ref 39–117)
BUN/Creatinine Ratio: 8 — ABNORMAL LOW (ref 10–24)
BUN: 8 mg/dL (ref 8–27)
Bilirubin Total: 0.5 mg/dL (ref 0.0–1.2)
CO2: 19 mmol/L — ABNORMAL LOW (ref 20–29)
Calcium: 9.6 mg/dL (ref 8.6–10.2)
Chloride: 104 mmol/L (ref 96–106)
Creatinine, Ser: 0.96 mg/dL (ref 0.76–1.27)
GFR calc Af Amer: 93 mL/min/{1.73_m2} (ref >59)
GFR calc non Af Amer: 80 mL/min/{1.73_m2} (ref >59)
Globulin, Total: 3.3 g/dL (ref 1.5–4.5)
Glucose: 97 mg/dL (ref 65–99)
Potassium: 4.1 mmol/L (ref 3.5–5.2)
Sodium: 140 mmol/L (ref 134–144)
Total Protein: 7.9 g/dL (ref 6.0–8.5)

## 2020-07-05 ENCOUNTER — Encounter: Payer: Self-pay | Admitting: Adult Health

## 2020-07-05 ENCOUNTER — Ambulatory Visit: Payer: Medicare HMO | Admitting: Adult Health

## 2020-07-05 NOTE — Progress Notes (Deleted)
[facility] [address] [place]. [place] [address] [phone]       OFFICE FOLLOW UP NOTE  James Riley Date of Birth:  24-Nov-1951 Medical Record Number:  [id number]   Referring MD: Marvel Plan  Reason for Referral: Stroke  Chief complaint: No chief complaint on file.     HPI:   Today, 07/05/2020, James Riley returns for follow-up regarding right posterior frontal white matter stroke in 06/2019.  No residual deficits.  History of prior stroke 2011 with residual RUE weakness.  Denies new stroke/TIA symptoms.  Continues on aspirin 81 mg daily and atorvastatin 20 mg daily without side effects for secondary stroke prevention.  Blood pressure today *** currently on amlodipine, spironolactone and losartan managed by PCP.  Ongoing tobacco use.     History provided for reference purposes only Update 01/05/2020: James Riley is a 69 year old male who is being seen today for stroke follow-up.  He has been doing well from a stroke standpoint without new or reoccurring stroke/TIA symptoms.  He does continue to have residual mild RUE weakness from prior stroke but denies worsening.  He continues to live independently maintaining all ADLs and IADLs without difficulty.  Previously on aspirin 81 mg daily but apparently patient discontinued this at some point as he did not feel this was needed or he was unable to pick up prescription (confusing reasoning).  He is agreeable to restart if a new prescription is sent in (Per epic review, PCP recently sent in refill on 12/25/2019 but then was discontinued by PCP today).  Denies bleeding or bruising while on aspirin.  Continues on atorvastatin 20 mg daily for secondary stroke prevention without side effects.  Recent lipid panel on 12/25/2019 showed LDL 86.  Blood pressure today 138/76. He does not routinely monitor blood pressure at home.  He does endorse ongoing compliance with all prescribed antihypertensives.  Ongoing tobacco use but he is  unsure daily amount as he will frequently get out his cigarettes to friends and neighbors.  He is not interested in quitting at this time despite knowing risks.  Previously on Viagra prescribed by PCP and is questioning if he can continue ongoing.  He was not able to pick up additional refills and is questioning reasoning.  No further concerns at this time.   Initial visit 09/10/2019 [name]: James Riley is a 69 year old African-American male seen today for initial office consultation visit for stroke.  History is obtained from the patient, review of electronic medical records and have personally reviewed imaging films in PACS.  He has a past medical history of hypertension, hyperlipidemia, prediabetes and remote left pontine stroke with residual mild right-sided weakness since 2011.  He was seen in the emergency room at [facility] on 06/03/2019 with symptoms beginning the day prior.  He complained of generalized weakness dizziness lightheadedness and some difficulty walking.  Due to multitude of complaints an MRI scan of the brain was obtained which showed a small area of restricted diffusion in the right posterior frontal white matter which was felt to be an acute infarct.  Patient denied any specific symptoms of slurred speech left facial weakness of left arm or body weakness.  He admitted that he must have been dehydrated as he had not been eating and drinking well and is unclear whether his increased right-sided weakness and gait difficulties were worsening of his old stroke due to dehydration.  CT angiogram of the brain and neck were obtained which showed no significant  large vessel stenosis or occlusion.  A 2 mm outpouching from supraclinoid left ICA was noted which likely represented a infundibulum rather than an aneurysm.  The patient refused to get a 2D echo as he wanted to go home.  LDL cholesterol was 60 mg percent and hemoglobin A1c was 5.6.  Patient was not on any antithrombotics prior to  admission and was placed on aspirin and Plavix which also apparently has not been taking.  He states is done well his gait and walking are back to baseline.  His blood pressure seems well controlled and today it is 148/75.  Patient was counseled to quit smoking but states he has cut back a bit but is still smoking.  Is tolerating Lipitor well without muscle aches and pains.  He has no new complaints.          ROS:   14 system review of systems is positive for RUE weakness and all other systems negative  PMH:  Past Medical History:  Diagnosis Date  . Hypercholesterolemia   . Hypertension   . Stroke [facility]) ~ 1989   "little weak on my right side since" (09/10/2016)    Social History:  Social History   Socioeconomic History  . Marital status: Divorced    Spouse name: Not on file  . Number of children: Not on file  . Years of education: Not on file  . Highest education level: Not on file  Occupational History  . Not on file  Tobacco Use  . Smoking status: Current Every Day Smoker    Packs/day: 1.00    Years: 50.00    Pack years: 50.00    Types: Cigarettes  . Smokeless tobacco: Never Used  Vaping Use  . Vaping Use: Never used  Substance and Sexual Activity  . Alcohol use: Not Currently    Alcohol/week: 1.0 standard drink    Types: 1 Cans of beer per week    Comment: a beer once a week. As of July 2020 reports no alcohol at all  . Drug use: No  . Sexual activity: Not Currently  Other Topics Concern  . Not on file  Social History Narrative  . Not on file   Social Determinants of Health   Financial Resource Strain:   . Difficulty of Paying Living Expenses:   Food Insecurity:   . Worried About Programme researcher, broadcasting/film/video in the Last Year:   . Barista in the Last Year:   Transportation Needs:   . Freight forwarder (Medical):   Marland Kitchen Lack of Transportation (Non-Medical):   Physical Activity:   . Days of Exercise per Week:   . Minutes of Exercise per Session:     Stress:   . Feeling of Stress :   Social Connections:   . Frequency of Communication with Friends and Family:   . Frequency of Social Gatherings with Friends and Family:   . Attends Religious Services:   . Active Member of Clubs or Organizations:   . Attends Banker Meetings:   Marland Kitchen Marital Status:   Intimate Partner Violence:   . Fear of Current or Ex-Partner:   . Emotionally Abused:   Marland Kitchen Physically Abused:   . Sexually Abused:     Medications:   Current Outpatient Medications on File Prior to Visit  Medication Sig Dispense Refill  . amLODipine (NORVASC) 10 MG tablet Take 1 tablet (10 mg total) by mouth daily. 90 tablet 1  . aspirin EC 81 MG  tablet Take 1 tablet (81 mg total) by mouth daily. 90 tablet 2  . atorvastatin (LIPITOR) 20 MG tablet Take 1 tablet (20 mg total) by mouth daily. 90 tablet 2  . losartan (COZAAR) 50 MG tablet Take 1 tablet (50 mg total) by mouth daily. 90 tablet 1  . spironolactone (ALDACTONE) 25 MG tablet Take 1 tablet (25 mg total) by mouth daily. 90 tablet 1   No current facility-administered medications on file prior to visit.    Allergies:  No Known Allergies  Physical Exam  There were no vitals filed for this visit. There is no height or weight on file to calculate BMI.  General: well developed, well nourished pleasant middle-aged African-American male, seated, in no evident distress Head: head normocephalic and atraumatic.   Neck: supple with no carotid or supraclavicular bruits Cardiovascular: regular rate and rhythm, no murmurs Musculoskeletal: no deformity Skin:  no rash/petichiae Vascular:  Normal pulses all extremities  Neurologic Exam Mental Status: Awake and fully alert. Oriented to place and time. Recent and remote memory intact. Attention span, concentration and fund of knowledge appropriate. Mood and affect appropriate.  Cranial Nerves: Pupils equal, briskly reactive to light. Extraocular movements full without nystagmus.  Visual fields full to confrontation. Hearing intact. Facial sensation intact.  Mild right nasolabial fold asymmetry.  Tongue, palate moves normally and symmetrically.  Motor: Normal bulk and tone. Normal strength in all tested extremity muscles except diminished fine finger movements on the right and mild right grip weakness (chronic).   Sensory.: intact to touch , pinprick , position and vibratory sensation.  Coordination: Rapid alternating movements normal in all extremities except slightly diminished right hand. Finger-to-nose and heel-to-shin performed accurately bilaterally. Gait and Station: Arises from chair without difficulty. Stance is normal. Gait demonstrates normal stride length and balance . Able to heel, toe and tandem walk with slight difficulty.  Reflexes: 1+ and symmetric. Toes downgoing.        ASSESSMENT/PLAN: 69 year old African-American male with right frontal white matter lacunar infarct in July 2020 due to small vessel disease.  Prior stroke in 2011 with residual RUE weakness vascular risk factors of hypertension, old stroke, tobacco use and hyperlipidemia.        1. Right frontal WM lacunar stroke: Recovered well from recent stroke.  Restart aspirin 81 mg daily  And continue atorvastatin 20 mg daily for secondary stroke prevention.  Ensure close PCP follow-up for aggressive stroke risk factor management 2. HTN: BP at goal<130/90.  Managed by PCP 3. HLD: LDL goal<70.  Continue atorvastatin; f/u with PCP for lipid panel monitoring and prescribing of statin 4. Prediabetes: A1c goal <7.0. Recent A1c 5.9.  Follow-up with PCP 5. Tobaccouse: Discussion regarding importance of smoking cessation.  He does verbalize understanding of increased risk with continued tobacco use but is not willing to quit at this time.  He was advised that there are many treatment options to help with cessation and to speak with PCP once he is ready   Follow-up in 6 months or call earlier   I  spent *** minutes of face-to-face and non-face-to-face time with patient.  This included previsit chart review, lab review, study review, order entry, electronic health record documentation, patient education   Ihor Austin, [facility]  [facility] [address] [place], [place] [address]  Phone [phone] Fax [phone] Note: This document was prepared with digital dictation and possible smart phrase technology. Any transcriptional errors that result from this process are unintentional.

## 2020-09-06 ENCOUNTER — Ambulatory Visit: Payer: Medicare HMO | Attending: Nurse Practitioner | Admitting: Nurse Practitioner

## 2020-09-06 ENCOUNTER — Other Ambulatory Visit: Payer: Self-pay

## 2020-09-06 ENCOUNTER — Other Ambulatory Visit: Payer: Self-pay | Admitting: Nurse Practitioner

## 2020-09-06 ENCOUNTER — Encounter: Payer: Self-pay | Admitting: Nurse Practitioner

## 2020-09-06 VITALS — BP 160/79 | HR 71 | Temp 97.7°F | Ht 70.0 in | Wt 191.0 lb

## 2020-09-06 DIAGNOSIS — I10 Essential (primary) hypertension: Secondary | ICD-10-CM | POA: Diagnosis not present

## 2020-09-06 DIAGNOSIS — M7501 Adhesive capsulitis of right shoulder: Secondary | ICD-10-CM

## 2020-09-06 DIAGNOSIS — K921 Melena: Secondary | ICD-10-CM

## 2020-09-06 DIAGNOSIS — Z1211 Encounter for screening for malignant neoplasm of colon: Secondary | ICD-10-CM

## 2020-09-06 DIAGNOSIS — R7303 Prediabetes: Secondary | ICD-10-CM | POA: Diagnosis not present

## 2020-09-06 DIAGNOSIS — E782 Mixed hyperlipidemia: Secondary | ICD-10-CM

## 2020-09-06 DIAGNOSIS — K5909 Other constipation: Secondary | ICD-10-CM

## 2020-09-06 LAB — POCT GLYCOSYLATED HEMOGLOBIN (HGB A1C): Hemoglobin A1C: 5.7 % — AB (ref 4.0–5.6)

## 2020-09-06 LAB — GLUCOSE, POCT (MANUAL RESULT ENTRY): POC Glucose: 95 mg/dl (ref 70–99)

## 2020-09-06 MED ORDER — ASPIRIN EC 81 MG PO TBEC: 81.0000 mg | DELAYED_RELEASE_TABLET | Freq: Every day | ORAL | 2 refills | Status: DC

## 2020-09-06 MED ORDER — SPIRONOLACTONE 25 MG PO TABS: 25.0000 mg | ORAL_TABLET | Freq: Every day | ORAL | 0 refills | Status: DC

## 2020-09-06 MED ORDER — DICLOFENAC SODIUM 1 % EX GEL: 4.0000 g | Freq: Four times a day (QID) | CUTANEOUS | 3 refills | Status: DC

## 2020-09-06 MED ORDER — DOCUSATE SODIUM 100 MG PO CAPS: 100.0000 mg | ORAL_CAPSULE | Freq: Every day | ORAL | 1 refills | Status: AC

## 2020-09-06 MED ORDER — LOSARTAN POTASSIUM 50 MG PO TABS: 50.0000 mg | ORAL_TABLET | Freq: Every day | ORAL | 0 refills | Status: DC

## 2020-09-06 MED ORDER — ATORVASTATIN CALCIUM 20 MG PO TABS: 20.0000 mg | ORAL_TABLET | Freq: Every day | ORAL | 0 refills | Status: DC

## 2020-09-06 MED ORDER — AMLODIPINE BESYLATE 10 MG PO TABS: 10.0000 mg | ORAL_TABLET | Freq: Every day | ORAL | 0 refills | Status: DC

## 2020-09-06 NOTE — Progress Notes (Signed)
Assessment & Plan:  James Riley was seen today for follow-up and arm pain.  Diagnoses and all orders for this visit:  Essential hypertension -     losartan (COZAAR) 50 MG tablet; Take 1 tablet (50 mg total) by mouth daily. -     Basic metabolic panel -     spironolactone (ALDACTONE) 25 MG tablet; Take 1 tablet (25 mg total) by mouth daily. -     amLODipine (NORVASC) 10 MG tablet; Take 1 tablet (10 mg total) by mouth daily. -     aspirin EC 81 MG tablet; Take 1 tablet (81 mg total) by mouth daily. Continue all antihypertensives as prescribed.  Remember to bring in your blood pressure log with you for your follow up appointment.  DASH/Mediterranean Diets are healthier choices for HTN.   Prediabetes -     Glucose (CBG) -     HgB A1c  Adhesive capsulitis of right shoulder -     diclofenac Sodium (VOLTAREN) 1 % GEL; Apply 4 g topically 4 (four) times daily. Work on losing weight to help reduce joint pain. May alternate with heat and ice application for pain relief. May also alternate with acetaminophen  as prescribed pain relief. Other alternatives include massage, acupuncture and water aerobics.  You must stay active and avoid a sedentary lifestyle.  Mixed hyperlipidemia -     atorvastatin (LIPITOR) 20 MG tablet; Take 1 tablet (20 mg total) by mouth daily. INSTRUCTIONS: Work on a low fat, heart healthy diet and participate in regular aerobic exercise program by working out at least 150 minutes per week; 5 days a week-30 minutes per day. Avoid red meat/beef/steak,  fried foods. junk foods, sodas, sugary drinks, unhealthy snacking, alcohol and smoking.  Drink at least 80 oz of water per day and monitor your carbohydrate intake daily.    Colon cancer screening -     Fecal occult blood, imunochemical([facility]/Sun[facility])  Blood in stool -     Fecal occult blood, imunochemical([facility]/Sun[facility]) -     CBC  Chronic constipation -     docusate sodium (COLACE) 100 MG capsule; Take 1 capsule (100  mg total) by mouth daily. Increase water intake.   Patient has been counseled on age-appropriate routine health concerns for screening and prevention. These are reviewed and up-to-date. Referrals have been placed accordingly. Immunizations are up-to-date or declined.    Subjective:   Chief Complaint  Patient presents with  . Follow-up    Pt. is here for a follow up on blood pressure.  . Arm Pain    Pt. stated he is having right arm pain and back pain.    [name] 69 y.o. male presents to office today for follow up.   Essential Hypertension Poorly controlled. He still has old pill bottles from April with at least 30 pills left in each one. Despite pill count he endorses compliance with taking  Amlodipine 10 mg daily, spirinolactone 25 mg daily and losartan 50 mg daily. Denies chest pain, shortness of breath, palpitations, lightheadedness, dizziness, headaches or BLE edema.  BP Readings from Last 3 Encounters:  09/07/20 (!) 157/76  09/06/20 (!) 160/79  03/18/20 (!) 158/67   Joint Pain Notes right shoulder pain. Deep aching. ROM is not limited. He denies any injury. Has a history of right shoulder adhesive capsulitis. Onset of pain 2 weeks ago.  Prediabetes Controlled with diet at this time. LDL not at goal with atorvastatin 20 mg daily.  Lab Results  Component Value Date  HGBA1C 5.7 (A) 09/06/2020   Lab Results  Component Value Date   LDLCALC 86 12/25/2019   Review of Systems  Constitutional: Negative for fever, malaise/fatigue and weight loss.  HENT: Negative.  Negative for nosebleeds.   Eyes: Negative.  Negative for blurred vision, double vision and photophobia.  Respiratory: Negative.  Negative for cough and shortness of breath.   Cardiovascular: Negative.  Negative for chest pain, palpitations and leg swelling.  Gastrointestinal: Positive for constipation. Negative for heartburn, nausea and vomiting.  Musculoskeletal: Positive for joint pain. Negative for  myalgias.  Neurological: Negative.  Negative for dizziness, focal weakness, seizures and headaches.  Psychiatric/Behavioral: Negative.  Negative for suicidal ideas.    Past Medical History:  Diagnosis Date  . Hypercholesterolemia   . Hypertension   . Stroke [facility]) ~ 1989   "little weak on my right side since" (09/10/2016)    Past Surgical History:  Procedure Laterality Date  . APPENDECTOMY    . LAPAROSCOPIC APPENDECTOMY N/A 09/07/2016   Procedure: APPENDECTOMY LAPAROSCOPIC converted to open appendectomy;  Surgeon: Abigail Miyamoto, MD;  Location: [facility] OR;  Service: General;  Laterality: N/A;    Family History  Problem Relation Age of Onset  . Diabetes Neg Hx   . Hypertension Neg Hx     Social History Reviewed with no changes to be made today.   Outpatient Medications Prior to Visit  Medication Sig Dispense Refill  . amLODipine (NORVASC) 10 MG tablet Take 1 tablet (10 mg total) by mouth daily. 90 tablet 1  . aspirin EC 81 MG tablet Take 1 tablet (81 mg total) by mouth daily. 90 tablet 2  . atorvastatin (LIPITOR) 20 MG tablet Take 1 tablet (20 mg total) by mouth daily. 90 tablet 2  . losartan (COZAAR) 50 MG tablet Take 1 tablet (50 mg total) by mouth daily. 90 tablet 1  . spironolactone (ALDACTONE) 25 MG tablet Take 1 tablet (25 mg total) by mouth daily. 90 tablet 1   No facility-administered medications prior to visit.    No Known Allergies     Objective:    BP (!) 160/79 (BP Location: Left Arm, Patient Position: Sitting, Cuff Size: Normal)   Pulse 71   Temp 97.7 F (36.5 C) (Temporal)   Ht 5\' 10"  (1.778 m)   Wt 191 lb (86.6 kg)   SpO2 99%   BMI 27.41 kg/m  Wt Readings from Last 3 Encounters:  09/07/20 189 lb 9.6 oz (86 kg)  09/06/20 191 lb (86.6 kg)  03/18/20 195 lb (88.5 kg)    Physical Exam Vitals and nursing note reviewed.  Constitutional:      Appearance: He is well-developed.  HENT:     Head: Normocephalic and atraumatic.  Cardiovascular:     Rate and  Rhythm: Normal rate and regular rhythm.     Heart sounds: Normal heart sounds. No murmur heard.  No friction rub. No gallop.   Pulmonary:     Effort: Pulmonary effort is normal. No tachypnea or respiratory distress.     Breath sounds: Normal breath sounds. No decreased breath sounds, wheezing, rhonchi or rales.  Chest:     Chest wall: No tenderness.  Abdominal:     General: Bowel sounds are normal.     Palpations: Abdomen is soft.  Musculoskeletal:        General: Normal range of motion.     Cervical back: Normal range of motion.  Skin:    General: Skin is warm and dry.  Neurological:  Mental Status: He is alert and oriented to person, place, and time.     Coordination: Coordination normal.  Psychiatric:        Behavior: Behavior normal. Behavior is cooperative.        Thought Content: Thought content normal.        Judgment: Judgment normal.          Patient has been counseled extensively about nutrition and exercise as well as the importance of adherence with medications and regular follow-up. The patient was given clear instructions to go to ER or return to medical center if symptoms don't improve, worsen or new problems develop. The patient verbalized understanding.   Follow-up: Return in about 3 months (around 12/07/2020).   Claiborne Rigg, FNP-BC [facility] [place], [place] [phone]   09/08/2020, 11:38 AM

## 2020-09-07 ENCOUNTER — Ambulatory Visit: Payer: Medicare HMO | Admitting: Adult Health

## 2020-09-07 ENCOUNTER — Encounter: Payer: Self-pay | Admitting: Adult Health

## 2020-09-07 VITALS — BP 157/76 | HR 75 | Ht 70.0 in | Wt 189.6 lb

## 2020-09-07 DIAGNOSIS — E785 Hyperlipidemia, unspecified: Secondary | ICD-10-CM

## 2020-09-07 DIAGNOSIS — Z8673 Personal history of transient ischemic attack (TIA), and cerebral infarction without residual deficits: Secondary | ICD-10-CM | POA: Diagnosis not present

## 2020-09-07 DIAGNOSIS — F17200 Nicotine dependence, unspecified, uncomplicated: Secondary | ICD-10-CM

## 2020-09-07 DIAGNOSIS — R7303 Prediabetes: Secondary | ICD-10-CM | POA: Diagnosis not present

## 2020-09-07 DIAGNOSIS — I10 Essential (primary) hypertension: Secondary | ICD-10-CM

## 2020-09-07 LAB — BASIC METABOLIC PANEL
BUN/Creatinine Ratio: 8 — ABNORMAL LOW (ref 10–24)
BUN: 7 mg/dL — ABNORMAL LOW (ref 8–27)
CO2: 23 mmol/L (ref 20–29)
Calcium: 9.8 mg/dL (ref 8.6–10.2)
Chloride: 105 mmol/L (ref 96–106)
Creatinine, Ser: 0.86 mg/dL (ref 0.76–1.27)
GFR calc Af Amer: 102 mL/min/{1.73_m2} (ref >59)
GFR calc non Af Amer: 88 mL/min/{1.73_m2} (ref >59)
Glucose: 90 mg/dL (ref 65–99)
Potassium: 4.3 mmol/L (ref 3.5–5.2)
Sodium: 141 mmol/L (ref 134–144)

## 2020-09-07 LAB — CBC
Hematocrit: 44.1 % (ref 37.5–51.0)
Hemoglobin: 15.3 g/dL (ref 13.0–17.7)
MCH: 32.6 pg (ref 26.6–33.0)
MCHC: 34.7 g/dL (ref 31.5–35.7)
MCV: 94 fL (ref 79–97)
Platelets: 202 10*3/uL (ref 150–450)
RBC: 4.7 x10E6/uL (ref 4.14–5.80)
RDW: 13.7 % (ref 11.6–15.4)
WBC: 8.7 10*3/uL (ref 3.4–10.8)

## 2020-09-07 MED FILL — AMLODIPINE BESYLATE 10 MG T: 10 | 90 days supply | Qty: 90 | Fill #0

## 2020-09-07 MED FILL — SPIRONOLACTONE 25 MG TABLET: 25 | 90 days supply | Qty: 90 | Fill #0

## 2020-09-07 MED FILL — DICLOFENAC SODIUM 1% GEL: 1 | 12 days supply | Qty: 200 | Fill #0

## 2020-09-07 MED FILL — ATORVASTATIN CALCIUM 20 MG: 20 | 90 days supply | Qty: 90 | Fill #0

## 2020-09-07 MED FILL — LOSARTAN POTASSIUM 50 MG TA: 50 | 90 days supply | Qty: 90 | Fill #0

## 2020-09-07 NOTE — Progress Notes (Signed)
[facility] [address] [place]. [place] [address] [phone]       OFFICE FOLLOW UP NOTE  James Riley Date of Birth:  December 02, 1951 Medical Record Number:  [id number]   Referring MD: Marvel Plan  Reason for Referral: Stroke  Chief Complaint  Patient presents with  . Follow-up    8 mo f/u, states he has been doing well since last visit.   Marland Kitchen room 9    alone       HPI:   Today, 09/07/2020, James Riley returns for 41-month stroke follow-up.  He has been stable since prior visit without new or reoccurring stroke/TIA symptoms.  Remains on aspirin 81 mg daily and atorvastatin 20 mg daily for secondary stroke prevention of side effects.  Blood pressure today 157/26 continuing on spironolactone, amlodipine and losartan managed by PCP.  He does not routinely monitor at home.  Continued tobacco use without any interest in cessation.  No concerns at this time.   History provided for reference purposes only Update 01/05/2020 JM: James Riley is a 69 year old male who is being seen today for stroke follow-up.  He has been doing well from a stroke standpoint without new or reoccurring stroke/TIA symptoms.  He does continue to have residual mild RUE weakness from prior stroke but denies worsening.  He continues to live independently maintaining all ADLs and IADLs without difficulty.  Previously on aspirin 81 mg daily but apparently patient discontinued this at some point as he did not feel this was needed or he was unable to pick up prescription (confusing reasoning).  He is agreeable to restart if a new prescription is sent in (Per epic review, PCP recently sent in refill on 12/25/2019 but then was discontinued by PCP today).  Denies bleeding or bruising while on aspirin.  Continues on atorvastatin 20 mg daily for secondary stroke prevention without side effects.  Recent lipid panel on 12/25/2019 showed LDL 86.  Blood pressure today 138/76. He does not routinely monitor blood pressure  at home.  He does endorse ongoing compliance with all prescribed antihypertensives.  Ongoing tobacco use but he is unsure daily amount as he will frequently get out his cigarettes to friends and neighbors.  He is not interested in quitting at this time despite knowing risks.  Previously on Viagra prescribed by PCP and is questioning if he can continue ongoing.  He was not able to pick up additional refills and is questioning reasoning.  No further concerns at this time.   Initial visit 09/10/2019 [name]: James Riley is a 69 year old African-American male seen today for initial office consultation visit for stroke.  History is obtained from the patient, review of electronic medical records and have personally reviewed imaging films in PACS.  He has a past medical history of hypertension, hyperlipidemia, prediabetes and remote left pontine stroke with residual mild right-sided weakness since 2011.  He was seen in the emergency room at [facility] on 06/03/2019 with symptoms beginning the day prior.  He complained of generalized weakness dizziness lightheadedness and some difficulty walking.  Due to multitude of complaints an MRI scan of the brain was obtained which showed a small area of restricted diffusion in the right posterior frontal white matter which was felt to be an acute infarct.  Patient denied any specific symptoms of slurred speech left facial weakness of left arm or body weakness.  He admitted that he must have been dehydrated as he had not been eating and drinking well  and is unclear whether his increased right-sided weakness and gait difficulties were worsening of his old stroke due to dehydration.  CT angiogram of the brain and neck were obtained which showed no significant large vessel stenosis or occlusion.  A 2 mm outpouching from supraclinoid left ICA was noted which likely represented a infundibulum rather than an aneurysm.  The patient refused to get a 2D echo as he wanted to go home.   LDL cholesterol was 60 mg percent and hemoglobin A1c was 5.6.  Patient was not on any antithrombotics prior to admission and was placed on aspirin and Plavix which also apparently has not been taking.  He states is done well his gait and walking are back to baseline.  His blood pressure seems well controlled and today it is 148/75.  Patient was counseled to quit smoking but states he has cut back a bit but is still smoking.  Is tolerating Lipitor well without muscle aches and pains.  He has no new complaints.    ROS:   14 system review of systems is positive for those listed in HPI and all other systems negative  PMH:  Past Medical History:  Diagnosis Date  . Hypercholesterolemia   . Hypertension   . Stroke [facility]) ~ 1989   "little weak on my right side since" (09/10/2016)    Social History:  Social History   Socioeconomic History  . Marital status: Divorced    Spouse name: Not on file  . Number of children: Not on file  . Years of education: Not on file  . Highest education level: Not on file  Occupational History  . Not on file  Tobacco Use  . Smoking status: Current Every Day Smoker    Packs/day: 1.00    Years: 50.00    Pack years: 50.00    Types: Cigarettes  . Smokeless tobacco: Never Used  Vaping Use  . Vaping Use: Never used  Substance and Sexual Activity  . Alcohol use: Not Currently    Alcohol/week: 1.0 standard drink    Types: 1 Cans of beer per week    Comment: a beer once a week. As of July 2020 reports no alcohol at all  . Drug use: No  . Sexual activity: Not Currently  Other Topics Concern  . Not on file  Social History Narrative  . Not on file   Social Determinants of Health   Financial Resource Strain:   . Difficulty of Paying Living Expenses: Not on file  Food Insecurity:   . Worried About Programme researcher, broadcasting/film/video in the Last Year: Not on file  . Ran Out of Food in the Last Year: Not on file  Transportation Needs:   . Lack of Transportation (Medical):  Not on file  . Lack of Transportation (Non-Medical): Not on file  Physical Activity:   . Days of Exercise per Week: Not on file  . Minutes of Exercise per Session: Not on file  Stress:   . Feeling of Stress : Not on file  Social Connections:   . Frequency of Communication with Friends and Family: Not on file  . Frequency of Social Gatherings with Friends and Family: Not on file  . Attends Religious Services: Not on file  . Active Member of Clubs or Organizations: Not on file  . Attends Banker Meetings: Not on file  . Marital Status: Not on file  Intimate Partner Violence:   . Fear of Current or Ex-Partner: Not on file  .  Emotionally Abused: Not on file  . Physically Abused: Not on file  . Sexually Abused: Not on file    Medications:   Current Outpatient Medications on File Prior to Visit  Medication Sig Dispense Refill  . amLODipine (NORVASC) 10 MG tablet Take 1 tablet (10 mg total) by mouth daily. 90 tablet 0  . aspirin EC 81 MG tablet Take 1 tablet (81 mg total) by mouth daily. 90 tablet 2  . atorvastatin (LIPITOR) 20 MG tablet Take 1 tablet (20 mg total) by mouth daily. 90 tablet 0  . diclofenac Sodium (VOLTAREN) 1 % GEL Apply 4 g topically 4 (four) times daily. 200 g 3  . docusate sodium (COLACE) 100 MG capsule Take 1 capsule (100 mg total) by mouth daily. 90 capsule 1  . losartan (COZAAR) 50 MG tablet Take 1 tablet (50 mg total) by mouth daily. 90 tablet 0  . spironolactone (ALDACTONE) 25 MG tablet Take 1 tablet (25 mg total) by mouth daily. 90 tablet 0   No current facility-administered medications on file prior to visit.    Allergies:  No Known Allergies  Physical Exam  Today's Vitals   09/07/20 0725  BP: (!) 157/76  Pulse: 75  Weight: 189 lb 9.6 oz (86 kg)  Height: 5\' 10"  (1.778 m)   Body mass index is 27.2 kg/m.  General: well developed, well nourished pleasant middle-aged African-American male, seated, in no evident distress Head: head  normocephalic and atraumatic.   Neck: supple with no carotid or supraclavicular bruits Cardiovascular: regular rate and rhythm, no murmurs Musculoskeletal: no deformity Skin:  no rash/petichiae Vascular:  Normal pulses all extremities  Neurologic Exam Mental Status: Awake and fully alert.  Fluent speech and language.  Oriented to place and time. Recent and remote memory intact. Attention span, concentration and fund of knowledge appropriate. Mood and affect appropriate.  Cranial Nerves: Pupils equal, briskly reactive to light. Extraocular movements full without nystagmus. Visual fields full to confrontation. Hearing intact. Facial sensation intact.  Mild right nasolabial fold asymmetry.  Tongue, palate moves normally and symmetrically.  Motor: Normal bulk and tone. Normal strength in all tested extremity muscles except diminished fine finger movements on the right and mild right grip weakness (chronic).   Sensory.: intact to touch , pinprick , position and vibratory sensation.  Coordination: Rapid alternating movements normal in all extremities except slightly diminished right hand. Finger-to-nose and heel-to-shin performed accurately bilaterally. Gait and Station: Arises from chair without difficulty. Stance is normal. Gait demonstrates normal stride length and balance . Able to heel, toe and tandem walk with slight difficulty.  Reflexes: 1+ and symmetric. Toes downgoing.        ASSESSMENT: 69 year old African-American male with right frontal white matter lacunar infarct in July 2020 due to small vessel disease.  Prior stroke in 2011 with residual RUE weakness vascular risk factors of hypertension, old stroke, tobacco use and hyperlipidemia.       PLAN:  1. Right frontal WM lacunar stroke: Recovered well without residual deficits.  Chronic RUE weakness stable from prior stroke.  Continue aspirin 81 mg daily and atorvastatin 20 mg daily for secondary stroke prevention. Discussed secondary  stroke prevention measures and importance of close PCP follow-up for aggressive stroke risk factor management 2. HTN: BP goal<130/90.  Elevated today.  On losartan, spironolactone and amlodipine per PCP.  Encouraged monitoring at home to ensure continued satisfactory management and to follow-up with PCP if remains uncontrolled. 3. HLD: LDL goal<70.  On atorvastatin 20 mg daily  per PCP 4. Prediabetes:  A1c goal <7.0.  Recent A1c 5.9.  Monitored by PCP. 5. Tobacco use: Again discussed importance of tobacco cessation which he verbalized understanding but has no interest in tobacco cessation at this time despite increased risk.  He is aware to discuss further with his PCP when he is ready to quit    Overall stable from stroke standpoint and recommend follow-up on an as-needed basis  I spent 30 minutes of face-to-face and non-face-to-face time with patient.  This included previsit chart review, lab review, study review, order entry, electronic health record documentation, patient education and discussion regarding history of prior strokes and residual deficits, importance of managing stroke risk factors and secondary stroke prevention measures and answered all questions to patient satisfaction   Ihor Austin, [facility]  [facility] [address] [place], [place] [address]  Phone [phone] Fax [phone] Note: This document was prepared with digital dictation and possible smart phrase technology. Any transcriptional errors that result from this process are unintentional.

## 2020-09-07 NOTE — Patient Instructions (Addendum)
Continue aspirin 81 mg daily  and atorvastatin 20 mg daily for secondary stroke prevention  Highly encourage tobacco cessation for secondary stroke prevention  Continue to follow up with PCP regarding cholesterol and blood pressure management  Maintain strict control of hypertension with blood pressure goal below 130/90 and cholesterol with LDL cholesterol (bad cholesterol) goal below 70 mg/dL.    Overall stable from stroke standpoint and recommend follow-up on an as-needed basis     Thank you for coming to see James Riley at [facility]. I hope we have been able to provide you high quality care today.  You may receive a patient satisfaction survey over the next few weeks. We would appreciate your feedback and comments so that we may continue to improve ourselves and the health of our patients.    Stroke Prevention Some medical conditions and behaviors are associated with a higher chance of having a stroke. You can help prevent a stroke by making nutrition, lifestyle, and other changes, including managing any medical conditions you may have. What nutrition changes can be made?   Eat healthy foods. You can do this by: ? Choosing foods high in fiber, such as fresh fruits and vegetables and whole grains. ? Eating at least 5 or more servings of fruits and vegetables a day. Try to fill half of your plate at each meal with fruits and vegetables. ? Choosing lean protein foods, such as lean cuts of meat, poultry without skin, fish, tofu, beans, and nuts. ? Eating low-fat dairy products. ? Avoiding foods that are high in salt (sodium). This can help lower blood pressure. ? Avoiding foods that have saturated fat, trans fat, and cholesterol. This can help prevent high cholesterol. ? Avoiding processed and premade foods.  Follow your health care provider's specific guidelines for losing weight, controlling high blood pressure (hypertension), lowering high cholesterol, and managing  diabetes. These may include: ? Reducing your daily calorie intake. ? Limiting your daily sodium intake to 1,500 milligrams (mg). ? Using only healthy fats for cooking, such as olive oil, canola oil, or sunflower oil. ? Counting your daily carbohydrate intake. What lifestyle changes can be made?  Maintain a healthy weight. Talk to your health care provider about your ideal weight.  Get at least 30 minutes of moderate physical activity at least 5 days a week. Moderate activity includes brisk walking, biking, and swimming.  Do not use any products that contain nicotine or tobacco, such as cigarettes and e-cigarettes. If you need help quitting, ask your health care provider. It may also be helpful to avoid exposure to secondhand smoke.  Limit alcohol intake to no more than 1 drink a day for nonpregnant women and 2 drinks a day for men. One drink equals 12 oz of beer, 5 oz of wine, or 1 oz of hard liquor.  Stop any illegal drug use.  Avoid taking birth control pills. Talk to your health care provider about the risks of taking birth control pills if: ? You are over 3 years old. ? You smoke. ? You get migraines. ? You have ever had a blood clot. What other changes can be made?  Manage your cholesterol levels. ? Eating a healthy diet is important for preventing high cholesterol. If cholesterol cannot be managed through diet alone, you may also need to take medicines. ? Take any prescribed medicines to control your cholesterol as told by your health care provider.  Manage your diabetes. ? Eating a healthy diet and exercising regularly are important  parts of managing your blood sugar. If your blood sugar cannot be managed through diet and exercise, you may need to take medicines. ? Take any prescribed medicines to control your diabetes as told by your health care provider.  Control your hypertension. ? To reduce your risk of stroke, try to keep your blood pressure below 130/80. ? Eating a  healthy diet and exercising regularly are an important part of controlling your blood pressure. If your blood pressure cannot be managed through diet and exercise, you may need to take medicines. ? Take any prescribed medicines to control hypertension as told by your health care provider. ? Ask your health care provider if you should monitor your blood pressure at home. ? Have your blood pressure checked every year, even if your blood pressure is normal. Blood pressure increases with age and some medical conditions.  Get evaluated for sleep disorders (sleep apnea). Talk to your health care provider about getting a sleep evaluation if you snore a lot or have excessive sleepiness.  Take over-the-counter and prescription medicines only as told by your health care provider. Aspirin or blood thinners (antiplatelets or anticoagulants) may be recommended to reduce your risk of forming blood clots that can lead to stroke.  Make sure that any other medical conditions you have, such as atrial fibrillation or atherosclerosis, are managed. What are the warning signs of a stroke? The warning signs of a stroke can be easily remembered as BEFAST.  B is for balance. Signs include: ? Dizziness. ? Loss of balance or coordination. ? Sudden trouble walking.  E is for eyes. Signs include: ? A sudden change in vision. ? Trouble seeing.  F is for face. Signs include: ? Sudden weakness or numbness of the face. ? The face or eyelid drooping to one side.  A is for arms. Signs include: ? Sudden weakness or numbness of the arm, usually on one side of the body.  S is for speech. Signs include: ? Trouble speaking (aphasia). ? Trouble understanding.  T is for time. ? These symptoms may represent a serious problem that is an emergency. Do not wait to see if the symptoms will go away. Get medical help right away. Call your local emergency services (911 in the [place] Do not drive yourself to the hospital.  Other  signs of stroke may include: ? A sudden, severe headache with no known cause. ? Nausea or vomiting. ? Seizure. Where to find more information For more information, visit:  American Stroke Association: www.strokeassociation.org  National Stroke Association: www.stroke.org Summary  You can prevent a stroke by eating healthy, exercising, not smoking, limiting alcohol intake, and managing any medical conditions you may have.  Do not use any products that contain nicotine or tobacco, such as cigarettes and e-cigarettes. If you need help quitting, ask your health care provider. It may also be helpful to avoid exposure to secondhand smoke.  Remember BEFAST for warning signs of stroke. Get help right away if you or a loved one has any of these signs. This information is not intended to replace advice given to you by your health care provider. Make sure you discuss any questions you have with your health care provider. Document Revised: 11/01/2017 Document Reviewed: 12/25/2016 Elsevier Patient Education  2020 [employer].

## 2020-09-07 NOTE — Progress Notes (Signed)
I agree with the above plan 

## 2020-09-08 ENCOUNTER — Encounter: Payer: Self-pay | Admitting: Nurse Practitioner

## 2020-09-19 LAB — FECAL OCCULT BLOOD, IMMUNOCHEMICAL

## 2020-10-05 ENCOUNTER — Telehealth: Payer: Self-pay | Admitting: Nurse Practitioner

## 2020-10-05 NOTE — Telephone Encounter (Signed)
PT has appt scheduled for 11/04/20 needs a colonostomy. Please advise

## 2020-10-12 ENCOUNTER — Other Ambulatory Visit: Payer: Self-pay | Admitting: Nurse Practitioner

## 2020-10-12 DIAGNOSIS — Z1211 Encounter for screening for malignant neoplasm of colon: Secondary | ICD-10-CM

## 2020-11-04 ENCOUNTER — Telehealth: Payer: Medicare HMO | Admitting: Nurse Practitioner

## 2020-12-27 ENCOUNTER — Other Ambulatory Visit: Payer: Self-pay | Admitting: Nurse Practitioner

## 2020-12-27 ENCOUNTER — Ambulatory Visit: Payer: Medicare HMO | Attending: Nurse Practitioner | Admitting: Nurse Practitioner

## 2020-12-27 ENCOUNTER — Other Ambulatory Visit: Payer: Self-pay

## 2020-12-27 ENCOUNTER — Encounter: Payer: Self-pay | Admitting: Nurse Practitioner

## 2020-12-27 DIAGNOSIS — I10 Essential (primary) hypertension: Secondary | ICD-10-CM

## 2020-12-27 DIAGNOSIS — E782 Mixed hyperlipidemia: Secondary | ICD-10-CM | POA: Diagnosis not present

## 2020-12-27 DIAGNOSIS — K5909 Other constipation: Secondary | ICD-10-CM | POA: Diagnosis not present

## 2020-12-27 DIAGNOSIS — M7501 Adhesive capsulitis of right shoulder: Secondary | ICD-10-CM

## 2020-12-27 MED ORDER — SPIRONOLACTONE 25 MG PO TABS: 25.0000 mg | ORAL_TABLET | Freq: Every day | ORAL | 0 refills | Status: DC

## 2020-12-27 MED ORDER — SENNOSIDES-DOCUSATE SODIUM 8.6-50 MG PO TABS: 2.0000 | ORAL_TABLET | Freq: Every day | ORAL | 3 refills | Status: DC

## 2020-12-27 MED ORDER — ATORVASTATIN CALCIUM 20 MG PO TABS: 20.0000 mg | ORAL_TABLET | Freq: Every day | ORAL | 0 refills | Status: DC

## 2020-12-27 MED ORDER — AMLODIPINE BESYLATE 10 MG PO TABS: 10.0000 mg | ORAL_TABLET | Freq: Every day | ORAL | 0 refills | Status: DC

## 2020-12-27 MED ORDER — LOSARTAN POTASSIUM 50 MG PO TABS: 50.0000 mg | ORAL_TABLET | Freq: Every day | ORAL | 0 refills | Status: DC

## 2020-12-27 MED ORDER — DICLOFENAC SODIUM 1 % EX GEL: 4.0000 g | Freq: Four times a day (QID) | CUTANEOUS | 3 refills | Status: DC

## 2020-12-27 MED ORDER — ASPIRIN EC 81 MG PO TBEC
81.0000 mg | DELAYED_RELEASE_TABLET | Freq: Every day | ORAL | 2 refills | Status: DC
Start: 2020-12-27 — End: 2021-06-27

## 2020-12-27 MED FILL — SPIRONOLACTONE 25 MG TABLET: 25 | 90 days supply | Qty: 90 | Fill #0

## 2020-12-27 MED FILL — LOSARTAN POTASSIUM 50 MG TA: 50 | 90 days supply | Qty: 90 | Fill #0

## 2020-12-27 MED FILL — DICLOFENAC SODIUM 1% GEL: 1 | 6 days supply | Qty: 100 | Fill #0

## 2020-12-27 MED FILL — AMLODIPINE BESYLATE 10 MG T: 10 | 90 days supply | Qty: 90 | Fill #0

## 2020-12-27 MED FILL — ATORVASTATIN CALCIUM 20 MG: 20 | 90 days supply | Qty: 90 | Fill #0

## 2020-12-27 NOTE — Progress Notes (Signed)
Virtual Visit via Telephone Note Due to national recommendations of social distancing due to COVID 19, telehealth visit is felt to be most appropriate for this patient at this time.  I discussed the limitations, risks, security and privacy concerns of performing an evaluation and management service by telephone and the availability of in person appointments. I also discussed with the patient that there may be a patient responsible charge related to this service. The patient expressed understanding and agreed to proceed.    I connected with James Riley on 12/27/20  at  11:10 AM EST  EDT by telephone and verified that I am speaking with the correct person using two identifiers.   Consent I discussed the limitations, risks, security and privacy concerns of performing an evaluation and management service by telephone and the availability of in person appointments. I also discussed with the patient that there may be a patient responsible charge related to this service. The patient expressed understanding and agreed to proceed.   Location of Patient: Private Residence    [facility] of Provider: [facility]    Persons participating in Telemedicine visit: Bertram Denver FNP-BC YY Anton Ruiz CMA [place] Gentle Moxey    History of Present Illness: Telemedicine visit for: Follow up  He has a past medical history of Hypercholesterolemia, HTN, and Stroke (~ 1989).   Essential Hypertension He continues to smoke 1-2 ppd. His brother has a blood pressure monitor that he can use however he has not used it to check his blood pressure. Denies chest pain, shortness of breath, palpitations, lightheadedness, dizziness, headaches or BLE edema.  BP Readings from Last 3 Encounters:  09/07/20 (!) 157/76  09/06/20 (!) 160/79  03/18/20 (!) 158/67   Lab Results  Component Value Date   LDLCALC 86 12/25/2019    Constipation Chronoic. Unrelieved with OTC metamucil.    Past Medical  History:  Diagnosis Date  . Hypercholesterolemia   . Hypertension   . Stroke [facility]) ~ 1989   "little weak on my right side since" (09/10/2016)    Past Surgical History:  Procedure Laterality Date  . APPENDECTOMY    . LAPAROSCOPIC APPENDECTOMY N/A 09/07/2016   Procedure: APPENDECTOMY LAPAROSCOPIC converted to open appendectomy;  Surgeon: Abigail Miyamoto, MD;  Location: [facility] OR;  Service: General;  Laterality: N/A;    Family History  Problem Relation Age of Onset  . Diabetes Neg Hx   . Hypertension Neg Hx     Social History   Socioeconomic History  . Marital status: Divorced    Spouse name: Not on file  . Number of children: Not on file  . Years of education: Not on file  . Highest education level: Not on file  Occupational History  . Not on file  Tobacco Use  . Smoking status: Current Every Day Smoker    Packs/day: 1.00    Years: 50.00    Pack years: 50.00    Types: Cigarettes  . Smokeless tobacco: Never Used  Vaping Use  . Vaping Use: Never used  Substance and Sexual Activity  . Alcohol use: Not Currently    Alcohol/week: 1.0 standard drink    Types: 1 Cans of beer per week    Comment: a beer once a week. As of July 2020 reports no alcohol at all  . Drug use: No  . Sexual activity: Not Currently  Other Topics Concern  . Not on file  Social History Narrative  . Not on file   Social Determinants of  Health   Financial Resource Strain: Not on file  Food Insecurity: Not on file  Transportation Needs: Not on file  Physical Activity: Not on file  Stress: Not on file  Social Connections: Not on file     Observations/Objective: Awake, alert and oriented x 3   Review of Systems  Constitutional: Negative for fever, malaise/fatigue and weight loss.  HENT: Negative.  Negative for nosebleeds.   Eyes: Negative.  Negative for blurred vision, double vision and photophobia.  Respiratory: Negative.  Negative for cough and shortness of breath.   Cardiovascular: Negative.   Negative for chest pain, palpitations and leg swelling.  Gastrointestinal: Positive for constipation. Negative for abdominal pain, blood in stool, diarrhea, heartburn, melena, nausea and vomiting.  Musculoskeletal: Positive for joint pain. Negative for myalgias.  Neurological: Negative.  Negative for dizziness, focal weakness, seizures and headaches.  Psychiatric/Behavioral: Positive for memory loss. Negative for suicidal ideas.    Assessment and Plan: Wilhelm was seen today for medication refill.  Diagnoses and all orders for this visit:  Essential hypertension -     spironolactone (ALDACTONE) 25 MG tablet; Take 1 tablet (25 mg total) by mouth daily. -     losartan (COZAAR) 50 MG tablet; Take 1 tablet (50 mg total) by mouth daily. -     amLODipine (NORVASC) 10 MG tablet; Take 1 tablet (10 mg total) by mouth daily. -     aspirin EC 81 MG tablet; Take 1 tablet (81 mg total) by mouth daily. Continue all antihypertensives as prescribed.  Remember to bring in your blood pressure log with you for your follow up appointment.  DASH/Mediterranean Diets are healthier choices for HTN.    Mixed hyperlipidemia -     atorvastatin (LIPITOR) 20 MG tablet; Take 1 tablet (20 mg total) by mouth daily. INSTRUCTIONS: Work on a low fat, heart healthy diet and participate in regular aerobic exercise program by working out at least 150 minutes per week; 5 days a week-30 minutes per day. Avoid red meat/beef/steak,  fried foods. junk foods, sodas, sugary drinks, unhealthy snacking, alcohol and smoking.  Drink at least 80 oz of water per day and monitor your carbohydrate intake daily.    Adhesive capsulitis of right shoulder -     diclofenac Sodium (VOLTAREN) 1 % GEL; Apply 4 g topically 4 (four) times daily. Pain is well controlled  Chronic Constipation -     senna-docusate (SENOKOT-S) 8.6-50 MG tablet; Take 2 tablets by mouth daily. May increase to 2 tablets twice a day by mouth if needed Increase water  intake, fiber, fruits, vegetables     Follow Up Instructions Return in about 3 months (around 03/27/2021).     I discussed the assessment and treatment plan with the patient. The patient was provided an opportunity to ask questions and all were answered. The patient agreed with the plan and demonstrated an understanding of the instructions.   The patient was advised to call back or seek an in-person evaluation if the symptoms worsen or if the condition fails to improve as anticipated.  I provided 15 minutes of non-face-to-face time during this encounter including median intraservice time, reviewing previous notes, labs, imaging, medications and explaining diagnosis and management.  Claiborne Rigg, FNP-BC

## 2020-12-28 ENCOUNTER — Telehealth: Payer: Self-pay | Admitting: Nurse Practitioner

## 2020-12-28 NOTE — Telephone Encounter (Signed)
Pt reports he has had the pfizer vaccine     and booster.

## 2020-12-30 ENCOUNTER — Other Ambulatory Visit: Payer: Self-pay | Admitting: Nurse Practitioner

## 2020-12-30 ENCOUNTER — Ambulatory Visit: Payer: Medicare HMO | Attending: Nurse Practitioner

## 2020-12-30 ENCOUNTER — Other Ambulatory Visit: Payer: Self-pay

## 2020-12-30 DIAGNOSIS — I10 Essential (primary) hypertension: Secondary | ICD-10-CM

## 2020-12-30 DIAGNOSIS — R7309 Other abnormal glucose: Secondary | ICD-10-CM

## 2020-12-30 DIAGNOSIS — E785 Hyperlipidemia, unspecified: Secondary | ICD-10-CM

## 2020-12-31 LAB — LIPID PANEL
Chol/HDL Ratio: 4 ratio (ref 0.0–5.0)
Cholesterol, Total: 133 mg/dL (ref 100–199)
HDL: 33 mg/dL — ABNORMAL LOW (ref >39)
LDL Chol Calc (NIH): 71 mg/dL (ref 0–99)
Triglycerides: 167 mg/dL — ABNORMAL HIGH (ref 0–149)
VLDL Cholesterol Cal: 29 mg/dL (ref 5–40)

## 2020-12-31 LAB — CMP14+EGFR
ALT: 19 IU/L (ref 0–44)
AST: 18 IU/L (ref 0–40)
Albumin/Globulin Ratio: 1.4 (ref 1.2–2.2)
Albumin: 4.8 g/dL (ref 3.8–4.8)
Alkaline Phosphatase: 100 IU/L (ref 44–121)
BUN/Creatinine Ratio: 8 — ABNORMAL LOW (ref 10–24)
BUN: 10 mg/dL (ref 8–27)
Bilirubin Total: 0.6 mg/dL (ref 0.0–1.2)
CO2: 21 mmol/L (ref 20–29)
Calcium: 9.9 mg/dL (ref 8.6–10.2)
Chloride: 103 mmol/L (ref 96–106)
Creatinine, Ser: 1.25 mg/dL (ref 0.76–1.27)
GFR calc Af Amer: 67 mL/min/{1.73_m2} (ref >59)
GFR calc non Af Amer: 58 mL/min/{1.73_m2} — ABNORMAL LOW (ref >59)
Globulin, Total: 3.5 g/dL (ref 1.5–4.5)
Glucose: 138 mg/dL — ABNORMAL HIGH (ref 65–99)
Potassium: 4.3 mmol/L (ref 3.5–5.2)
Sodium: 141 mmol/L (ref 134–144)
Total Protein: 8.3 g/dL (ref 6.0–8.5)

## 2020-12-31 LAB — HEMOGLOBIN A1C
Est. average glucose Bld gHb Est-mCnc: 117 mg/dL
Hgb A1c MFr Bld: 5.7 % — ABNORMAL HIGH (ref 4.8–5.6)

## 2021-01-12 ENCOUNTER — Other Ambulatory Visit: Payer: Self-pay

## 2021-01-12 DIAGNOSIS — Z1211 Encounter for screening for malignant neoplasm of colon: Secondary | ICD-10-CM

## 2021-01-13 LAB — FECAL OCCULT BLOOD, IMMUNOCHEMICAL

## 2021-03-07 ENCOUNTER — Telehealth: Payer: Self-pay | Admitting: Nurse Practitioner

## 2021-03-07 ENCOUNTER — Other Ambulatory Visit: Payer: Self-pay

## 2021-03-07 ENCOUNTER — Ambulatory Visit: Payer: Medicare HMO | Attending: Nurse Practitioner | Admitting: Nurse Practitioner

## 2021-03-07 NOTE — Telephone Encounter (Signed)
1st call attempt: Unable to leave VM on #[phone]    2nd call attempt: No VM set up on [phone]

## 2021-04-03 ENCOUNTER — Other Ambulatory Visit: Payer: Self-pay | Admitting: Nurse Practitioner

## 2021-04-03 ENCOUNTER — Other Ambulatory Visit: Payer: Self-pay

## 2021-04-03 DIAGNOSIS — E782 Mixed hyperlipidemia: Secondary | ICD-10-CM

## 2021-04-03 DIAGNOSIS — I10 Essential (primary) hypertension: Secondary | ICD-10-CM

## 2021-04-03 MED ORDER — SPIRONOLACTONE 25 MG PO TABS
ORAL_TABLET | Freq: Every day | ORAL | 0 refills | Status: DC
  Filled 2021-04-03: qty 90, 90d supply, fill #0

## 2021-04-03 MED ORDER — LOSARTAN POTASSIUM 50 MG PO TABS
ORAL_TABLET | Freq: Every day | ORAL | 0 refills | Status: DC
  Filled 2021-04-03: qty 90, 90d supply, fill #0

## 2021-04-03 MED ORDER — AMLODIPINE BESYLATE 10 MG PO TABS
ORAL_TABLET | Freq: Every day | ORAL | 0 refills | Status: DC
  Filled 2021-04-03: qty 90, 90d supply, fill #0

## 2021-04-03 MED ORDER — ATORVASTATIN CALCIUM 20 MG PO TABS
ORAL_TABLET | Freq: Every day | ORAL | 0 refills | Status: DC
Start: 2021-04-03 — End: 2021-06-27
  Filled 2021-04-03: qty 90, 90d supply, fill #0

## 2021-04-03 NOTE — Telephone Encounter (Signed)
Copied from CRM [id number]. Topic: Quick Communication - Rx Refill/Question >> Apr 03, 2021 11:52 AM Jaquita Rector A wrote: Medication: spironolactone (ALDACTONE) 25 MG tablet, losartan (COZAAR) 50 MG tablet, atorvastatin (LIPITOR) 20 MG tablet, amLODipine (NORVASC) 10 MG tablet  Has the patient contacted their pharmacy? Yes.   (Agent: If no, request that the patient contact the pharmacy for the refill.) (Agent: If yes, when and what did the pharmacy advise?)  Preferred Pharmacy (with phone number or street name): [facility]  Phone:  [phone] Fax:  [phone]     Agent: Please be advised that RX refills may take up to 3 business days. We ask that you follow-up with your pharmacy.

## 2021-04-03 NOTE — Telephone Encounter (Signed)
Approved per protocol.  Requested Prescriptions  Pending Prescriptions Disp Refills  . spironolactone (ALDACTONE) 25 MG tablet 90 tablet 0    Sig: TAKE 1 TABLET (25 MG TOTAL) BY MOUTH DAILY.     Cardiovascular: Diuretics - Aldosterone Antagonist Failed - 04/03/2021 11:58 AM      Failed - Last BP in normal range    BP Readings from Last 1 Encounters:  09/07/20 (!) 157/76         Passed - Cr in normal range and within 360 days    Creatinine, Ser  Date Value Ref Range Status  12/30/2020 1.25 0.76 - 1.27 mg/dL Final   Creatinine, POC  Date Value Ref Range Status  04/05/2017 300 mg/dL Final   Creatinine, Urine  Date Value Ref Range Status  06/03/2019 29.13 mg/dL Final    Comment:    Performed at [facility], [address], [place], [place] [address]         Passed - K in normal range and within 360 days    Potassium  Date Value Ref Range Status  12/30/2020 4.3 3.5 - 5.2 mmol/L Final         Passed - Na in normal range and within 360 days    Sodium  Date Value Ref Range Status  12/30/2020 141 134 - 144 mmol/L Final         Passed - Valid encounter within last 6 months    Recent Outpatient Visits          3 months ago Chronic constipation   [place] [facility] [place], Shea Stakes, NP   6 months ago Essential hypertension   [place] [facility] [place], Shea Stakes, NP   1 year ago Essential hypertension   [place] [facility] [place], Shea Stakes, NP   1 year ago Essential hypertension    [facility] [place], Shea Stakes, NP   1 year ago Need for influenza vaccination   [facility] [place], Cornelius Moras, RPH-CPP      Future Appointments            In 1 month Claiborne Rigg, NP [facility]           . losartan (COZAAR) 50 MG tablet 90 tablet 0    Sig: TAKE 1 TABLET (50 MG TOTAL) BY MOUTH DAILY.     Cardiovascular:   Angiotensin Receptor Blockers Failed - 04/03/2021 11:58 AM      Failed - Last BP in normal range    BP Readings from Last 1 Encounters:  09/07/20 (!) 157/76         Passed - Cr in normal range and within 180 days    Creatinine, Ser  Date Value Ref Range Status  12/30/2020 1.25 0.76 - 1.27 mg/dL Final   Creatinine, POC  Date Value Ref Range Status  04/05/2017 300 mg/dL Final   Creatinine, Urine  Date Value Ref Range Status  06/03/2019 29.13 mg/dL Final    Comment:    Performed at [facility], [address], [place], [place] [address]         Passed - K in normal range and within 180 days    Potassium  Date Value Ref Range Status  12/30/2020 4.3 3.5 - 5.2 mmol/L Final         Passed - Patient is not pregnant  Passed - Valid encounter within last 6 months    Recent Outpatient Visits          3 months ago Chronic constipation   [place] [facility] [employer], Shea Stakes, NP   6 months ago Essential hypertension   [place] [facility] Claiborne Rigg, NP   1 year ago Essential hypertension   [place] [facility] Claiborne Rigg, NP   1 year ago Essential hypertension   [place] [facility] Claiborne Rigg, NP   1 year ago Need for influenza vaccination   [facility] [place], Cornelius Moras, RPH-CPP      Future Appointments            In 1 month Claiborne Rigg, NP [employer] [facility]           . atorvastatin (LIPITOR) 20 MG tablet 90 tablet 0    Sig: TAKE 1 TABLET (20 MG TOTAL) BY MOUTH DAILY.     Cardiovascular:  Antilipid - Statins Failed - 04/03/2021 11:58 AM      Failed - HDL in normal range and within 360 days    HDL  Date Value Ref Range Status  12/30/2020 33 (L) >39 mg/dL Final         Failed - Triglycerides in normal range and within 360 days    Triglycerides  Date Value Ref Range Status  12/30/2020 167  (H) 0 - 149 mg/dL Final         Passed - Total Cholesterol in normal range and within 360 days    Cholesterol, Total  Date Value Ref Range Status  12/30/2020 133 100 - 199 mg/dL Final         Passed - LDL in normal range and within 360 days    LDL Chol Calc (NIH)  Date Value Ref Range Status  12/30/2020 71 0 - 99 mg/dL Final         Passed - Patient is not pregnant      Passed - Valid encounter within last 12 months    Recent Outpatient Visits          3 months ago Chronic constipation   [place] [facility] [place], Shea Stakes, NP   6 months ago Essential hypertension   [place] [facility] [place], Shea Stakes, NP   1 year ago Essential hypertension   [place] [facility] [place], Shea Stakes, NP   1 year ago Essential hypertension   [place] [facility] [place], Shea Stakes, NP   1 year ago Need for influenza vaccination   [facility] [place], Cornelius Moras, RPH-CPP      Future Appointments            In 1 month Claiborne Rigg, NP [facility]           . amLODipine (NORVASC) 10 MG tablet 90 tablet 0    Sig: TAKE 1 TABLET (10 MG TOTAL) BY MOUTH DAILY.     Cardiovascular:  Calcium Channel Blockers Failed - 04/03/2021 11:58 AM      Failed - Last BP in normal range    BP Readings from Last 1 Encounters:  09/07/20 (!) 157/76         Passed - Valid encounter within last 6 months  Recent Outpatient Visits          3 months ago Chronic constipation   [place] [facility] [place], Shea Stakes, NP   6 months ago Essential hypertension   [facility] [place], Shea Stakes, NP   1 year ago Essential hypertension   [place] [facility] Claiborne Rigg, NP   1 year ago Essential hypertension   [place] [facility] Claiborne Rigg, NP   1 year ago Need for  influenza vaccination   [facility] [place], Cornelius Moras, RPH-CPP      Future Appointments            In 1 month Claiborne Rigg, NP [facility]

## 2021-05-05 ENCOUNTER — Other Ambulatory Visit: Payer: Self-pay

## 2021-05-05 ENCOUNTER — Encounter: Payer: Self-pay | Admitting: Nurse Practitioner

## 2021-05-05 ENCOUNTER — Ambulatory Visit: Payer: Medicare HMO | Attending: Nurse Practitioner | Admitting: Nurse Practitioner

## 2021-05-05 VITALS — BP 144/64 | HR 74 | Ht 70.0 in | Wt 185.6 lb

## 2021-05-05 DIAGNOSIS — F17210 Nicotine dependence, cigarettes, uncomplicated: Secondary | ICD-10-CM

## 2021-05-05 DIAGNOSIS — E785 Hyperlipidemia, unspecified: Secondary | ICD-10-CM | POA: Diagnosis not present

## 2021-05-05 DIAGNOSIS — R7303 Prediabetes: Secondary | ICD-10-CM

## 2021-05-05 DIAGNOSIS — Z1211 Encounter for screening for malignant neoplasm of colon: Secondary | ICD-10-CM

## 2021-05-05 DIAGNOSIS — I10 Essential (primary) hypertension: Secondary | ICD-10-CM

## 2021-05-05 DIAGNOSIS — F17200 Nicotine dependence, unspecified, uncomplicated: Secondary | ICD-10-CM

## 2021-05-05 DIAGNOSIS — K5909 Other constipation: Secondary | ICD-10-CM

## 2021-05-05 DIAGNOSIS — Z23 Encounter for immunization: Secondary | ICD-10-CM | POA: Diagnosis not present

## 2021-05-05 MED ORDER — SENNOSIDES-DOCUSATE SODIUM 8.6-50 MG PO TABS
ORAL_TABLET | ORAL | 3 refills | Status: DC
  Filled 2021-05-05: qty 180, fill #0

## 2021-05-05 NOTE — Progress Notes (Signed)
Assessment & Plan:  James Riley was seen today for hypertension.  Diagnoses and all orders for this visit:  Primary hypertension -     CMP14+EGFR Continue all antihypertensives as prescribed.  Remember to bring in your blood pressure log with you for your follow up appointment.  DASH/Mediterranean Diets are healthier choices for HTN.    Colon cancer screening -     Ambulatory referral to Gastroenterology  Chronic constipation -     senna-docusate (SENOKOT-S) 8.6-50 MG tablet; TAKE 2 TABLETS BY MOUTH DAILY. MAY INCREASE TO 2 TABLETS TWICE A DAY BY MOUTH IF NEEDED  Dyslipidemia, goal LDL below 100 -     Lipid panel INSTRUCTIONS: Work on a low fat, heart healthy diet and participate in regular aerobic exercise program by working out at least 150 minutes per week; 5 days a week-30 minutes per day. Avoid red meat/beef/steak,  fried foods. junk foods, sodas, sugary drinks, unhealthy snacking, alcohol and smoking.  Drink at least 80 oz of water per day and monitor your carbohydrate intake daily.   Prediabetes -     Hemoglobin A1c  Need for vaccination against Streptococcus pneumoniae using pneumococcal conjugate vaccine 13 -     Pneumococcal conjugate vaccine 13-valent IM  Tobacco Dependence James Riley was counseled on the dangers of tobacco use, and was advised to quit. Reviewed strategies to maximize success, including removing cigarettes and smoking materials from environment, stress management and support of family/friends as well as pharmacological alternatives including: Wellbutrin, Chantix, Nicotine patch, Nicotine gum or lozenges. Smoking cessation support: smoking cessation hotline: 1-800-QUIT-NOW.  Smoking cessation classes are also available through [facility]. Call ([phone] or visit our website at [email or web address]   A total of 3 minutes was spent on counseling for smoking cessation and James Riley is not ready to quit.    Patient has been counseled on  age-appropriate routine health concerns for screening and prevention. These are reviewed and up-to-date. Referrals have been placed accordingly. Immunizations are up-to-date or declined.    Subjective:   Chief Complaint  Patient presents with  . Hypertension   [name] 70 y.o. male presents to office today for follow up. He has a past medical history of Hypercholesterolemia, Hypertension, and Stroke (~ 1989).   Essential Hypertension Blood pressure improved. He did smoke cigarettes prior to his appt today. Not ready to quit. Taking amlodipine 10 mg daily, losartan 50 mg daily and spironolactone 25 mg daily. Denies chest pain, shortness of breath, palpitations, lightheadedness, dizziness, headaches or BLE edema.  BP Readings from Last 3 Encounters:  05/05/21 (!) 144/64  09/07/20 (!) 157/76  09/06/20 (!) 160/79   Chronic Constipation He is still unsure if he actually took senna or not for his constipation.  Will refill today.   Prediabetes Well controlled with diet only. LDL at goal with atorvastatin 10 mg daily.  Lab Results  Component Value Date   HGBA1C 6.0 (H) 05/05/2021   Lab Results  Component Value Date   LDLCALC 72 05/05/2021     Review of Systems  Constitutional: Negative for fever, malaise/fatigue and weight loss.  HENT: Negative.  Negative for nosebleeds.   Eyes: Negative.  Negative for blurred vision, double vision and photophobia.  Respiratory: Negative.  Negative for cough and shortness of breath.   Cardiovascular: Negative.  Negative for chest pain, palpitations and leg swelling.  Gastrointestinal: Positive for constipation. Negative for heartburn, nausea and vomiting.  Musculoskeletal: Negative.  Negative for myalgias.  Neurological: Negative.  Negative for dizziness, focal weakness, seizures and headaches.  Psychiatric/Behavioral: Negative.  Negative for suicidal ideas.    Past Medical History:  Diagnosis Date  . Hypercholesterolemia   .  Hypertension   . Stroke [facility] ~ 1989   "little weak on my right side since" (09/10/2016)    Past Surgical History:  Procedure Laterality Date  . APPENDECTOMY    . LAPAROSCOPIC APPENDECTOMY N/A 09/07/2016   Procedure: APPENDECTOMY LAPAROSCOPIC converted to open appendectomy;  Surgeon: Coralie Keens, MD;  Location: [facility] OR;  Service: General;  Laterality: N/A;    Family History  Problem Relation Age of Onset  . Diabetes Neg Hx   . Hypertension Neg Hx     Social History Reviewed with no changes to be made today.   Outpatient Medications Prior to Visit  Medication Sig Dispense Refill  . amLODipine (NORVASC) 10 MG tablet TAKE 1 TABLET (10 MG TOTAL) BY MOUTH DAILY. 90 tablet 0  . aspirin EC 81 MG tablet Take 1 tablet (81 mg total) by mouth daily. 90 tablet 2  . atorvastatin (LIPITOR) 20 MG tablet TAKE 1 TABLET (20 MG TOTAL) BY MOUTH DAILY. 90 tablet 0  . diclofenac Sodium (VOLTAREN) 1 % GEL APPLY 4 G TOPICALLY 4 (FOUR) TIMES DAILY. 200 g 3  . losartan (COZAAR) 50 MG tablet TAKE 1 TABLET (50 MG TOTAL) BY MOUTH DAILY. 90 tablet 0  . spironolactone (ALDACTONE) 25 MG tablet TAKE 1 TABLET (25 MG TOTAL) BY MOUTH DAILY. 90 tablet 0  . aspirin 81 MG EC tablet TAKE 1 TABLET (81 MG TOTAL) BY MOUTH DAILY. 90 tablet 2  . senna-docusate (SENOKOT-S) 8.6-50 MG tablet TAKE 2 TABLETS BY MOUTH DAILY. MAY INCREASE TO 2 TABLETS TWICE A DAY BY MOUTH IF NEEDED 60 tablet 3   No facility-administered medications prior to visit.    No Known Allergies     Objective:    BP (!) 144/64   Pulse 74   Ht '5\' 10"'  (1.778 m)   Wt 185 lb 9.6 oz (84.2 kg)   SpO2 99%   BMI 26.63 kg/m  Wt Readings from Last 3 Encounters:  05/05/21 185 lb 9.6 oz (84.2 kg)  09/07/20 189 lb 9.6 oz (86 kg)  09/06/20 191 lb (86.6 kg)    Physical Exam Vitals and nursing note reviewed.  Constitutional:      Appearance: He is well-developed.  HENT:     Head: Normocephalic and atraumatic.  Cardiovascular:     Rate and Rhythm:  Normal rate and regular rhythm.     Heart sounds: Normal heart sounds. No murmur heard. No friction rub. No gallop.   Pulmonary:     Effort: Pulmonary effort is normal. No tachypnea or respiratory distress.     Breath sounds: Normal breath sounds. No decreased breath sounds, wheezing, rhonchi or rales.  Chest:     Chest wall: No tenderness.  Abdominal:     General: Bowel sounds are normal.     Palpations: Abdomen is soft.  Musculoskeletal:        General: Normal range of motion.     Cervical back: Normal range of motion.  Skin:    General: Skin is warm and dry.  Neurological:     Mental Status: He is alert and oriented to person, place, and time.     Coordination: Coordination normal.  Psychiatric:        Behavior: Behavior normal. Behavior is cooperative.        Thought Content: Thought content normal.  Judgment: Judgment normal.          Patient has been counseled extensively about nutrition and exercise as well as the importance of adherence with medications and regular follow-up. The patient was given clear instructions to go to ER or return to medical center if symptoms don't improve, worsen or new problems develop. The patient verbalized understanding.   Follow-up: Return in about 3 months (around 08/05/2021).   [name], FNP-BC [facility] and [place], [place]   05/05/2021, 2:35 PM

## 2021-05-06 LAB — CMP14+EGFR
ALT: 15 IU/L (ref 0–44)
AST: 18 IU/L (ref 0–40)
Albumin/Globulin Ratio: 1.6 (ref 1.2–2.2)
Albumin: 5 g/dL — ABNORMAL HIGH (ref 3.8–4.8)
Alkaline Phosphatase: 92 IU/L (ref 44–121)
BUN/Creatinine Ratio: 17 (ref 10–24)
BUN: 19 mg/dL (ref 8–27)
Bilirubin Total: 0.7 mg/dL (ref 0.0–1.2)
CO2: 19 mmol/L — ABNORMAL LOW (ref 20–29)
Calcium: 10.2 mg/dL (ref 8.6–10.2)
Chloride: 104 mmol/L (ref 96–106)
Creatinine, Ser: 1.15 mg/dL (ref 0.76–1.27)
Globulin, Total: 3.2 g/dL (ref 1.5–4.5)
Glucose: 99 mg/dL (ref 65–99)
Potassium: 4.3 mmol/L (ref 3.5–5.2)
Sodium: 138 mmol/L (ref 134–144)
Total Protein: 8.2 g/dL (ref 6.0–8.5)
eGFR: 68 mL/min/{1.73_m2} (ref >59)

## 2021-05-06 LAB — LIPID PANEL
Chol/HDL Ratio: 3.7 ratio (ref 0.0–5.0)
Cholesterol, Total: 127 mg/dL (ref 100–199)
HDL: 34 mg/dL — ABNORMAL LOW (ref >39)
LDL Chol Calc (NIH): 72 mg/dL (ref 0–99)
Triglycerides: 116 mg/dL (ref 0–149)
VLDL Cholesterol Cal: 21 mg/dL (ref 5–40)

## 2021-05-06 LAB — HEMOGLOBIN A1C
Est. average glucose Bld gHb Est-mCnc: 126 mg/dL
Hgb A1c MFr Bld: 6 % — ABNORMAL HIGH (ref 4.8–5.6)

## 2021-05-07 ENCOUNTER — Encounter: Payer: Self-pay | Admitting: Nurse Practitioner

## 2021-05-08 ENCOUNTER — Other Ambulatory Visit: Payer: Self-pay

## 2021-05-25 ENCOUNTER — Encounter: Payer: Self-pay | Admitting: *Deleted

## 2021-05-29 ENCOUNTER — Encounter: Payer: Self-pay | Admitting: *Deleted

## 2021-06-27 ENCOUNTER — Other Ambulatory Visit: Payer: Self-pay | Admitting: Nurse Practitioner

## 2021-06-27 DIAGNOSIS — I10 Essential (primary) hypertension: Secondary | ICD-10-CM

## 2021-06-27 DIAGNOSIS — E782 Mixed hyperlipidemia: Secondary | ICD-10-CM

## 2021-06-27 DIAGNOSIS — M7501 Adhesive capsulitis of right shoulder: Secondary | ICD-10-CM

## 2021-06-27 DIAGNOSIS — K5909 Other constipation: Secondary | ICD-10-CM

## 2021-06-27 MED ORDER — LOSARTAN POTASSIUM 50 MG PO TABS
ORAL_TABLET | Freq: Every day | ORAL | 0 refills | Status: DC
  Filled 2021-06-27: qty 90, 90d supply, fill #0

## 2021-06-27 MED ORDER — ATORVASTATIN CALCIUM 20 MG PO TABS
ORAL_TABLET | Freq: Every day | ORAL | 0 refills | Status: DC
Start: 2021-06-27 — End: 2021-10-02
  Filled 2021-06-27: qty 90, 90d supply, fill #0

## 2021-06-27 MED ORDER — SPIRONOLACTONE 25 MG PO TABS
ORAL_TABLET | Freq: Every day | ORAL | 0 refills | Status: DC
  Filled 2021-06-27: qty 90, 90d supply, fill #0

## 2021-06-27 MED ORDER — AMLODIPINE BESYLATE 10 MG PO TABS
ORAL_TABLET | Freq: Every day | ORAL | 0 refills | Status: DC
  Filled 2021-06-27: qty 90, 90d supply, fill #0

## 2021-06-27 MED ORDER — ASPIRIN EC 81 MG PO TBEC
81.0000 mg | DELAYED_RELEASE_TABLET | Freq: Every day | ORAL | 0 refills | Status: DC
  Filled 2021-06-27: qty 90, 90d supply, fill #0

## 2021-06-27 MED ORDER — SENNOSIDES-DOCUSATE SODIUM 8.6-50 MG PO TABS
ORAL_TABLET | ORAL | 0 refills | Status: DC
  Filled 2021-06-27: qty 180, fill #0

## 2021-06-27 MED ORDER — DICLOFENAC SODIUM 1 % EX GEL
4.0000 g | Freq: Four times a day (QID) | CUTANEOUS | 0 refills | Status: DC
  Filled 2021-06-27: qty 200, 7d supply, fill #0

## 2021-06-27 NOTE — Telephone Encounter (Signed)
Requested Prescriptions  Pending Prescriptions Disp Refills  . aspirin EC 81 MG tablet 90 tablet 0    Sig: Take 1 tablet (81 mg total) by mouth daily.     Analgesics:  NSAIDS - aspirin Passed - 06/27/2021  2:57 PM      Passed - Patient is not pregnant      Passed - Valid encounter within last 12 months    Recent Outpatient Visits          1 month ago Primary hypertension   [facility] [place], Shea Stakes, NP   6 months ago Chronic constipation   [facility] [place], Shea Stakes, NP   9 months ago Essential hypertension   [facility] [place], Shea Stakes, NP   1 year ago Essential hypertension   [facility] [place], Shea Stakes, NP   1 year ago Essential hypertension   [facility] Claiborne Rigg, NP      Future Appointments            In 1 month Claiborne Rigg, NP [employer]           . amLODipine (NORVASC) 10 MG tablet 90 tablet 0    Sig: TAKE 1 TABLET (10 MG TOTAL) BY MOUTH DAILY.     Cardiovascular:  Calcium Channel Blockers Failed - 06/27/2021  2:57 PM      Failed - Last BP in normal range    BP Readings from Last 1 Encounters:  05/05/21 (!) 144/64         Passed - Valid encounter within last 6 months    Recent Outpatient Visits          1 month ago Primary hypertension   [place] [facility] [place], Shea Stakes, NP   6 months ago Chronic constipation   [facility] [place], Shea Stakes, NP   9 months ago Essential hypertension   [facility] [place], Shea Stakes, NP   1 year ago Essential hypertension   [facility] [place], Shea Stakes, NP   1 year ago Essential hypertension    [facility] Claiborne Rigg, NP      Future Appointments            In 1 month Claiborne Rigg, NP [facility] [facility]           . atorvastatin (LIPITOR) 20 MG tablet 90 tablet 0    Sig: TAKE 1 TABLET (20 MG TOTAL) BY MOUTH DAILY.     Cardiovascular:  Antilipid - Statins Failed - 06/27/2021  2:57 PM      Failed - HDL in normal range and within 360 days    HDL  Date Value Ref Range Status  05/05/2021 34 (L) >39 mg/dL Final         Passed - Total Cholesterol in normal range and within 360 days    Cholesterol, Total  Date Value Ref Range Status  05/05/2021 127 100 - 199 mg/dL Final         Passed - LDL in normal range and within 360 days    LDL Chol Calc (NIH)  Date Value Ref Range Status  05/05/2021 72 0 - 99 mg/dL Final  Passed - Triglycerides in normal range and within 360 days    Triglycerides  Date Value Ref Range Status  05/05/2021 116 0 - 149 mg/dL Final         Passed - Patient is not pregnant      Passed - Valid encounter within last 12 months    Recent Outpatient Visits          1 month ago Primary hypertension   [facility] [place], Shea Stakes, NP   6 months ago Chronic constipation   [facility] [place], Shea Stakes, NP   9 months ago Essential hypertension   [facility] [place], Shea Stakes, NP   1 year ago Essential hypertension   [facility] [place], Shea Stakes, NP   1 year ago Essential hypertension   [place] [facility] Claiborne Rigg, NP      Future Appointments            In 1 month Claiborne Rigg, NP [facility] [facility]           . diclofenac Sodium (VOLTAREN) 1 % GEL 200 g 0    Sig: APPLY 4 G TOPICALLY 4 (FOUR) TIMES DAILY.     Analgesics:  Topicals Passed - 06/27/2021  2:57 PM      Passed - Valid encounter within last 12 months    Recent Outpatient Visits          1 month ago Primary hypertension   [facility] [place], Shea Stakes, NP   6 months ago Chronic constipation   [facility] [place], Shea Stakes, NP   9 months ago Essential hypertension   [facility] [place], Shea Stakes, NP   1 year ago Essential hypertension   [facility] [place], Shea Stakes, NP   1 year ago Essential hypertension    [facility] Claiborne Rigg, NP      Future Appointments            In 1 month Claiborne Rigg, NP [employer] [facility]           . losartan (COZAAR) 50 MG tablet 90 tablet 0    Sig: TAKE 1 TABLET (50 MG TOTAL) BY MOUTH DAILY.     Cardiovascular:  Angiotensin Receptor Blockers Failed - 06/27/2021  2:57 PM      Failed - Last BP in normal range    BP Readings from Last 1 Encounters:  05/05/21 (!) 144/64         Passed - Cr in normal range and within 180 days    Creatinine, Ser  Date Value Ref Range Status  05/05/2021 1.15 0.76 - 1.27 mg/dL Final   Creatinine, POC  Date Value Ref Range Status  04/05/2017 300 mg/dL Final   Creatinine, Urine  Date Value Ref Range Status  06/03/2019 29.13 mg/dL Final    Comment:    Performed at [facility], [address], [place], [place] [address]         Passed - K in normal range and within 180 days    Potassium  Date Value Ref Range Status  05/05/2021 4.3 3.5 - 5.2 mmol/L Final         Passed - Patient is not  pregnant      Passed - Valid encounter within last 6 months    Recent Outpatient Visits          1 month ago Primary hypertension   [facility] [place], Shea Stakes, NP   6 months ago Chronic constipation   [facility] [place], Shea Stakes, NP   9 months ago Essential hypertension   [facility] [place], Shea Stakes, NP   1 year ago Essential hypertension   [place] [facility] [place], Shea Stakes,  NP   1 year ago Essential hypertension   [place] [facility] Claiborne Rigg, NP      Future Appointments            In 1 month Claiborne Rigg, NP [employer] [facility]           . senna-docusate (SENOKOT-S) 8.6-50 MG tablet 180 tablet 0    Sig: TAKE 2 TABLETS BY MOUTH DAILY. MAY INCREASE TO 2 TABLETS TWICE A DAY BY MOUTH IF NEEDED     Over the Counter:  OTC Passed - 06/27/2021  2:57 PM      Passed - Valid encounter within last 12 months    Recent Outpatient Visits          1 month ago Primary hypertension   [place] [facility] [place], Shea Stakes, NP   6 months ago Chronic constipation   [facility] [place], Shea Stakes, NP   9 months ago Essential hypertension   [facility] [place], Shea Stakes, NP   1 year ago Essential hypertension   [facility] [place], Shea Stakes, NP   1 year ago Essential hypertension   [place] [facility] Claiborne Rigg, NP      Future Appointments            In 1 month Claiborne Rigg, NP [facility]           . spironolactone (ALDACTONE) 25 MG tablet 90 tablet 0    Sig: TAKE 1 TABLET (25 MG TOTAL) BY MOUTH DAILY.     Cardiovascular: Diuretics - Aldosterone Antagonist Failed - 06/27/2021  2:57 PM      Failed - Last BP in normal range    BP Readings from Last 1 Encounters:  05/05/21 (!) 144/64         Passed - Cr in normal range and within 360 days    Creatinine, Ser  Date Value Ref Range Status  05/05/2021 1.15 0.76 - 1.27 mg/dL Final   Creatinine, POC  Date Value Ref Range Status  04/05/2017 300 mg/dL Final   Creatinine, Urine  Date Value Ref Range Status  06/03/2019 29.13 mg/dL Final    Comment:    Performed at [facility], [address], [place], [place] [address]         Passed - K in normal range and within 360 days     Potassium  Date Value Ref Range Status  05/05/2021 4.3 3.5 - 5.2 mmol/L Final         Passed - Na in normal range and within 360 days    Sodium  Date Value Ref Range Status  05/05/2021 138 134 - 144 mmol/L Final  Passed - Valid encounter within last 6 months    Recent Outpatient Visits          1 month ago Primary hypertension   [place] [facility] [place], Shea StakesZelda W, NP   6 months ago Chronic constipation   [facility] [place], Shea StakesZelda W, NP   9 months ago Essential hypertension   [facility] [place], Shea StakesZelda W, NP   1 year ago Essential hypertension   [place] [facility] [place], Shea StakesZelda W, NP   1 year ago Essential hypertension   [place] [facility] Claiborne RiggFleming, Zelda W, NP      Future Appointments            In 1 month Claiborne RiggFleming, Zelda W, NP [facility]

## 2021-06-27 NOTE — Telephone Encounter (Signed)
Medication Refill - Medication:  1.)aspirin EC 81 MG tablet 2.)diclofenac Sodium (VOLTAREN) 1 % GEL   3.)senna-docusate (SENOKOT-S) 8.6-50 MG tablet  4.)spironolactone (ALDACTONE) 25 MG tablet  5.)atorvastatin (LIPITOR) 20 MG tablet  6.)amLODipine (NORVASC) 10 MG tablet  7.)losartan (COZAAR) 50 MG tablet   Has the patient contacted their pharmacy? No.  Preferred Pharmacy (with phone number or street name):  [facility]  [address], [place] [address]  Phone:  [phone]  Fax:  [phone]   Agent: Please be advised that RX refills may take up to 3 business days. We ask that you follow-up with your pharmacy.

## 2021-06-28 ENCOUNTER — Other Ambulatory Visit: Payer: Self-pay

## 2021-07-03 ENCOUNTER — Other Ambulatory Visit: Payer: Self-pay

## 2021-08-09 ENCOUNTER — Encounter: Payer: Self-pay | Admitting: Gastroenterology

## 2021-08-09 ENCOUNTER — Other Ambulatory Visit: Payer: Self-pay

## 2021-08-09 ENCOUNTER — Other Ambulatory Visit (HOSPITAL_COMMUNITY)
Admission: RE | Admit: 2021-08-09 | Discharge: 2021-08-09 | Disposition: A | Discharge status: Home / Self Care | Payer: Medicare HMO | Source: Ambulatory Visit | Attending: Nurse Practitioner | Admitting: Nurse Practitioner

## 2021-08-09 ENCOUNTER — Ambulatory Visit: Payer: Medicare HMO | Attending: Nurse Practitioner | Admitting: Nurse Practitioner

## 2021-08-09 ENCOUNTER — Encounter: Payer: Self-pay | Admitting: Nurse Practitioner

## 2021-08-09 VITALS — BP 127/73 | HR 73 | Ht 70.0 in | Wt 187.0 lb

## 2021-08-09 DIAGNOSIS — I10 Essential (primary) hypertension: Secondary | ICD-10-CM | POA: Diagnosis not present

## 2021-08-09 DIAGNOSIS — R7303 Prediabetes: Secondary | ICD-10-CM

## 2021-08-09 DIAGNOSIS — R7689 Other specified abnormal immunological findings in serum: Secondary | ICD-10-CM

## 2021-08-09 DIAGNOSIS — K5909 Other constipation: Secondary | ICD-10-CM | POA: Diagnosis not present

## 2021-08-09 DIAGNOSIS — Z1211 Encounter for screening for malignant neoplasm of colon: Secondary | ICD-10-CM

## 2021-08-09 DIAGNOSIS — K921 Melena: Secondary | ICD-10-CM | POA: Diagnosis not present

## 2021-08-09 DIAGNOSIS — R768 Other specified abnormal immunological findings in serum: Secondary | ICD-10-CM | POA: Diagnosis not present

## 2021-08-09 DIAGNOSIS — Z23 Encounter for immunization: Secondary | ICD-10-CM

## 2021-08-09 DIAGNOSIS — Z114 Encounter for screening for human immunodeficiency virus [HIV]: Secondary | ICD-10-CM | POA: Diagnosis not present

## 2021-08-09 DIAGNOSIS — R3916 Straining to void: Secondary | ICD-10-CM

## 2021-08-09 DIAGNOSIS — N401 Enlarged prostate with lower urinary tract symptoms: Secondary | ICD-10-CM | POA: Diagnosis not present

## 2021-08-09 DIAGNOSIS — E785 Hyperlipidemia, unspecified: Secondary | ICD-10-CM

## 2021-08-09 LAB — POCT URINALYSIS DIP (CLINITEK)
Bilirubin, UA: NEGATIVE
Blood, UA: NEGATIVE
Glucose, UA: NEGATIVE mg/dL
Ketones, POC UA: NEGATIVE mg/dL
Leukocytes, UA: NEGATIVE
Nitrite, UA: NEGATIVE
POC PROTEIN,UA: NEGATIVE
Spec Grav, UA: >=1.03 — AB (ref 1.010–1.025)
Urobilinogen, UA: 0.2 E.U./dL
pH, UA: 5.5 (ref 5.0–8.0)

## 2021-08-09 MED ORDER — TADALAFIL 5 MG PO TABS
5.0000 mg | ORAL_TABLET | Freq: Every day | ORAL | 1 refills | Status: DC | PRN
  Filled 2021-08-09: qty 30, 30d supply, fill #0

## 2021-08-09 NOTE — Progress Notes (Signed)
Assessment & Plan:  Kallum was seen today for hypertension.  Diagnoses and all orders for this visit:  Essential hypertension -     CMP14+EGFR Continue all antihypertensives as prescribed.  Remember to bring in your blood pressure log with you for your follow up appointment.  DASH/Mediterranean Diets are healthier choices for HTN.    Prediabetes -     Hemoglobin A1c  Colon cancer screening -     Ambulatory referral to Gastroenterology  Chronic constipation -     Ambulatory referral to Gastroenterology  Benign prostatic hyperplasia (BPH) with straining on urination -     PSA -     tadalafil (CIALIS) 5 MG tablet; Take 1 tablet (5 mg total) by mouth daily as needed for erectile dysfunction. -     Urine cytology ancillary only -     POCT URINALYSIS DIP (CLINITEK) -     Ambulatory referral to Urology  Positive hepatitis C antibody test -     HCV RNA quant  Blood in stool -     Ambulatory referral to Gastroenterology -     CBC with Differential  Encounter for screening for HIV -     HIV antibody (with reflex)  Need for immunization against influenza -     Flu Vaccine QUAD 8moIM (Fluarix, Fluzone & Alfiuria Quad PF)   Patient has been counseled on age-appropriate routine health concerns for screening and prevention. These are reviewed and up-to-date. Referrals have been placed accordingly. Immunizations are up-to-date or declined.    Subjective:   Chief Complaint  Patient presents with   Hypertension   HPI James ROMANOSKI89y.o. male presents to office today for follow up to HTN and prediabetes He has a past medical history of Hypercholesterolemia, Hypertension, and Stroke (~ 1989).    GU Endorses 2 months onset of difficulty urinating and erectile dysfunction.   GI Noticed blood in toilet last month when he was urinating and having a bowel  movement. There is also a history of chronic constipation.    HTN Blood pressure well controlled. Taking amlodipine  10 mg daily, losartan 50 mg daily and spironolactone 25 mg daily. Denies chest pain, shortness of breath, palpitations, lightheadedness, dizziness, headaches or BLE edema.   BP Readings from Last 3 Encounters:  08/09/21 127/73  05/05/21 (!) 144/64  09/07/20 (!) 157/76     Prediabetes Well controlled. He is not taking any medications for this.  Lab Results  Component Value Date   HGBA1C 5.9 (H) 08/09/2021    Review of Systems  Constitutional:  Negative for fever, malaise/fatigue and weight loss.  HENT: Negative.  Negative for nosebleeds.   Eyes: Negative.  Negative for blurred vision, double vision and photophobia.  Respiratory: Negative.  Negative for cough and shortness of breath.   Cardiovascular: Negative.  Negative for chest pain, palpitations and leg swelling.  Gastrointestinal:  Positive for blood in stool and constipation. Negative for abdominal pain, diarrhea, heartburn, melena, nausea and vomiting.  Genitourinary:  Positive for frequency and urgency. Negative for dysuria, flank pain and hematuria.       SEE HPI  Musculoskeletal:  Negative for myalgias.  Neurological: Negative.  Negative for dizziness, focal weakness, seizures and headaches.  Psychiatric/Behavioral: Negative.  Negative for suicidal ideas.    Past Medical History:  Diagnosis Date   Hypercholesterolemia    Hypertension    Stroke ([facility] ~ 1989   "little weak on my right side since" (09/10/2016)    Past Surgical History:  Procedure Laterality Date   APPENDECTOMY     LAPAROSCOPIC APPENDECTOMY N/A 09/07/2016   Procedure: APPENDECTOMY LAPAROSCOPIC converted to open appendectomy;  Surgeon: Coralie Keens, MD;  Location: [place];  Service: General;  Laterality: N/A;    Family History  Problem Relation Age of Onset   Diabetes Neg Hx    Hypertension Neg Hx     Social History Reviewed with no changes to be made today.   Outpatient Medications Prior to Visit  Medication Sig Dispense Refill   amLODipine  (NORVASC) 10 MG tablet TAKE 1 TABLET (10 MG TOTAL) BY MOUTH DAILY. 90 tablet 0   aspirin EC 81 MG tablet Take 1 tablet (81 mg total) by mouth daily. 90 tablet 0   atorvastatin (LIPITOR) 20 MG tablet TAKE 1 TABLET (20 MG TOTAL) BY MOUTH DAILY. 90 tablet 0   diclofenac Sodium (VOLTAREN) 1 % GEL APPLY 4 G TOPICALLY 4 (FOUR) TIMES DAILY. 200 g 0   losartan (COZAAR) 50 MG tablet TAKE 1 TABLET (50 MG TOTAL) BY MOUTH DAILY. 90 tablet 0   senna-docusate (SENOKOT-S) 8.6-50 MG tablet TAKE 2 TABLETS BY MOUTH DAILY. MAY INCREASE TO 2 TABLETS TWICE A DAY BY MOUTH IF NEEDED 180 tablet 0   spironolactone (ALDACTONE) 25 MG tablet TAKE 1 TABLET (25 MG TOTAL) BY MOUTH DAILY. 90 tablet 0   No facility-administered medications prior to visit.    No Known Allergies     Objective:    BP 127/73   Pulse 73   Ht '5\' 10"'  (1.778 m)   Wt 187 lb (84.8 kg)   SpO2 99%   BMI 26.83 kg/m  Wt Readings from Last 3 Encounters:  08/09/21 187 lb (84.8 kg)  05/05/21 185 lb 9.6 oz (84.2 kg)  09/07/20 189 lb 9.6 oz (86 kg)    Physical Exam       Patient has been counseled extensively about nutrition and exercise as well as the importance of adherence with medications and regular follow-up. The patient was given clear instructions to go to ER or return to medical center if symptoms don't improve, worsen or new problems develop. The patient verbalized understanding.   Follow-up: Return in about 3 months (around 11/08/2021).   [name], FNP-BC [facility] [place], [place]   08/20/2021, 9:38 PM

## 2021-08-11 LAB — CMP14+EGFR
ALT: 12 IU/L (ref 0–44)
AST: 15 IU/L (ref 0–40)
Albumin/Globulin Ratio: 1.6 (ref 1.2–2.2)
Albumin: 5 g/dL — ABNORMAL HIGH (ref 3.7–4.7)
Alkaline Phosphatase: 105 IU/L (ref 44–121)
BUN/Creatinine Ratio: 6 — ABNORMAL LOW (ref 10–24)
BUN: 7 mg/dL — ABNORMAL LOW (ref 8–27)
Bilirubin Total: 0.5 mg/dL (ref 0.0–1.2)
CO2: 20 mmol/L (ref 20–29)
Calcium: 10 mg/dL (ref 8.6–10.2)
Chloride: 101 mmol/L (ref 96–106)
Creatinine, Ser: 1.08 mg/dL (ref 0.76–1.27)
Globulin, Total: 3.1 g/dL (ref 1.5–4.5)
Glucose: 104 mg/dL — ABNORMAL HIGH (ref 65–99)
Potassium: 4.1 mmol/L (ref 3.5–5.2)
Sodium: 138 mmol/L (ref 134–144)
Total Protein: 8.1 g/dL (ref 6.0–8.5)
eGFR: 73 mL/min/{1.73_m2} (ref >59)

## 2021-08-11 LAB — CBC WITH DIFFERENTIAL/PLATELET
Basophils Absolute: 0.1 10*3/uL (ref 0.0–0.2)
Basos: 1 %
EOS (ABSOLUTE): 0.2 10*3/uL (ref 0.0–0.4)
Eos: 3 %
Hematocrit: 43.6 % (ref 37.5–51.0)
Hemoglobin: 15.3 g/dL (ref 13.0–17.7)
Immature Grans (Abs): 0 10*3/uL (ref 0.0–0.1)
Immature Granulocytes: 0 %
Lymphocytes Absolute: 2.4 10*3/uL (ref 0.7–3.1)
Lymphs: 41 %
MCH: 32.5 pg (ref 26.6–33.0)
MCHC: 35.1 g/dL (ref 31.5–35.7)
MCV: 93 fL (ref 79–97)
Monocytes Absolute: 0.5 10*3/uL (ref 0.1–0.9)
Monocytes: 9 %
Neutrophils Absolute: 2.7 10*3/uL (ref 1.4–7.0)
Neutrophils: 46 %
Platelets: 174 10*3/uL (ref 150–450)
RBC: 4.71 x10E6/uL (ref 4.14–5.80)
RDW: 13.2 % (ref 11.6–15.4)
WBC: 5.8 10*3/uL (ref 3.4–10.8)

## 2021-08-11 LAB — URINE CYTOLOGY ANCILLARY ONLY
Bacterial Vaginitis-Urine: NEGATIVE
Candida Urine: NEGATIVE
Chlamydia: NEGATIVE
Comment: NEGATIVE
Comment: NEGATIVE
Comment: NORMAL
Neisseria Gonorrhea: NEGATIVE
Trichomonas: NEGATIVE

## 2021-08-11 LAB — HCV RNA QUANT: Hepatitis C Quantitation: NOT DETECTED IU/mL

## 2021-08-11 LAB — HIV ANTIBODY (ROUTINE TESTING W REFLEX): HIV Screen 4th Generation wRfx: NONREACTIVE

## 2021-08-11 LAB — HEMOGLOBIN A1C
Est. average glucose Bld gHb Est-mCnc: 123 mg/dL
Hgb A1c MFr Bld: 5.9 % — ABNORMAL HIGH (ref 4.8–5.6)

## 2021-08-11 LAB — PSA: Prostate Specific Ag, Serum: 0.7 ng/mL (ref 0.0–4.0)

## 2021-08-15 ENCOUNTER — Telehealth: Payer: Self-pay

## 2021-08-17 ENCOUNTER — Ambulatory Visit (HOSPITAL_BASED_OUTPATIENT_CLINIC_OR_DEPARTMENT_OTHER): Payer: Medicare HMO

## 2021-08-17 DIAGNOSIS — Z0000 Encounter for general adult medical examination without abnormal findings: Secondary | ICD-10-CM

## 2021-08-17 NOTE — Progress Notes (Signed)
Subjective:   James Riley is a 70 y.o. male who presents for an Initial Medicare Annual Wellness Visit.I connected with  James Riley on 08/17/21 by a audio enabled telemedicine application and verified that I am speaking with the correct person using two identifiers.   I discussed the limitations of evaluation and management by telemedicine. The patient expressed understanding and agreed to proceed.   Location of patient:Home Location of provider:Home  Persons participating in visit:James Riley(patient), Cornelius Moras cma  Review of Systems    Defer to PCP       Objective:    There were no vitals filed for this visit. There is no height or weight on file to calculate BMI.  Advanced Directives 08/17/2021 04/05/2017 02/18/2017 09/10/2016 09/07/2016 09/07/2015 09/06/2015  Does Patient Have a Medical Advance Directive? No No No No No No No  Would patient like information on creating a medical advance directive? No - Patient declined - - No - patient declined information - No - patient declined information -    Current Medications (verified) Outpatient Encounter Medications as of 08/17/2021  Medication Sig   amLODipine (NORVASC) 10 MG tablet TAKE 1 TABLET (10 MG TOTAL) BY MOUTH DAILY.   aspirin EC 81 MG tablet Take 1 tablet (81 mg total) by mouth daily.   atorvastatin (LIPITOR) 20 MG tablet TAKE 1 TABLET (20 MG TOTAL) BY MOUTH DAILY.   diclofenac Sodium (VOLTAREN) 1 % GEL APPLY 4 G TOPICALLY 4 (FOUR) TIMES DAILY.   losartan (COZAAR) 50 MG tablet TAKE 1 TABLET (50 MG TOTAL) BY MOUTH DAILY.   senna-docusate (SENOKOT-S) 8.6-50 MG tablet TAKE 2 TABLETS BY MOUTH DAILY. MAY INCREASE TO 2 TABLETS TWICE A DAY BY MOUTH IF NEEDED   spironolactone (ALDACTONE) 25 MG tablet TAKE 1 TABLET (25 MG TOTAL) BY MOUTH DAILY.   tadalafil (CIALIS) 5 MG tablet Take 1 tablet (5 mg total) by mouth daily as needed for erectile dysfunction.   No facility-administered encounter medications on file as of  08/17/2021.    Allergies (verified) Patient has no known allergies.   History: Past Medical History:  Diagnosis Date   Hypercholesterolemia    Hypertension    Stroke [facility]) ~ 1989   "little weak on my right side since" (09/10/2016)   Past Surgical History:  Procedure Laterality Date   APPENDECTOMY     LAPAROSCOPIC APPENDECTOMY N/A 09/07/2016   Procedure: APPENDECTOMY LAPAROSCOPIC converted to open appendectomy;  Surgeon: Abigail Miyamoto, MD;  Location: [facility] OR;  Service: General;  Laterality: N/A;   Family History  Problem Relation Age of Onset   Diabetes Neg Hx    Hypertension Neg Hx    Social History   Socioeconomic History   Marital status: Divorced    Spouse name: Not on file   Number of children: Not on file   Years of education: Not on file   Highest education level: Not on file  Occupational History   Not on file  Tobacco Use   Smoking status: Every Day    Packs/day: 0.50    Years: 50.00    Pack years: 25.00    Types: Cigarettes   Smokeless tobacco: Never  Vaping Use   Vaping Use: Never used  Substance and Sexual Activity   Alcohol use: Not Currently    Alcohol/week: 1.0 standard drink    Types: 1 Cans of beer per week    Comment: a beer once a week. As of July 2020 reports no alcohol at all  Drug use: No   Sexual activity: Not Currently  Other Topics Concern   Not on file  Social History Narrative   Not on file   Social Determinants of Health   Financial Resource Strain: Low Risk    Difficulty of Paying Living Expenses: Not hard at all  Food Insecurity: No Food Insecurity   Worried About Running Out of Food in the Last Year: Never true   Ran Out of Food in the Last Year: Never true  Transportation Needs: No Transportation Needs   Lack of Transportation (Medical): No   Lack of Transportation (Non-Medical): No  Physical Activity: Inactive   Days of Exercise per Week: 0 days   Minutes of Exercise per Session: 0 min  Stress: No Stress Concern  Present   Feeling of Stress : Not at all  Social Connections: Moderately Integrated   Frequency of Communication with Friends and Family: Three times a week   Frequency of Social Gatherings with Friends and Family: Once a week   Attends Religious Services: More than 4 times per year   Active Member of [employer] or Organizations: Yes   Attends Engineer, structural: More than 4 times per year   Marital Status: Separated    Tobacco Counseling Ready to quit: Not Answered Counseling given: Not Answered   Clinical Intake:  Pre-visit preparation completed: Yes  Pain : No/denies pain     Nutritional Risks: None  How often do you need to have someone help you when you read instructions, pamphlets, or other written materials from your doctor or pharmacy?: 1 - Never  Diabetic?NO  Interpreter Needed?: No      Activities of Daily Living In your present state of health, do you have any difficulty performing the following activities: 08/17/2021  Hearing? N  Vision? N  Difficulty concentrating or making decisions? N  Walking or climbing stairs? N  Dressing or bathing? N  Doing errands, shopping? N  Preparing Food and eating ? N  Using the Toilet? N  In the past six months, have you accidently leaked urine? Y  Do you have problems with loss of bowel control? N  Managing your Medications? N  Managing your Finances? N  Housekeeping or managing your Housekeeping? N  Some recent data might be hidden    Patient Care Team: Claiborne Rigg, NP as PCP - General (Nurse Practitioner)  Indicate any recent Medical Services you may have received from other than [facility] providers in the past year (date may be approximate).     Assessment:   This is a routine wellness examination for James Riley.  Hearing/Vision screen No results found.  Dietary issues and exercise activities discussed: Exercise limited by: None identified   Goals Addressed   None    Depression Screen PHQ 2/9  Scores 08/17/2021 08/09/2021 12/27/2020 07/28/2019 06/02/2019 01/06/2019 10/03/2018  PHQ - 2 Score 0 0 0 0 0 0 0  PHQ- 9 Score 0 0 - 2 0 1 0    Fall Risk Fall Risk  08/17/2021 08/09/2021 05/05/2021 03/18/2020 09/10/2019  Falls in the past year? 0 0 0 0 0  Number falls in past yr: - 0 0 - -  Injury with Fall? 0 0 0 - -  Risk for fall due to : No Fall Risks No Fall Risks - - -    FALL RISK PREVENTION PERTAINING TO THE HOME:  Any stairs in or around the home? No  If so, are there any without handrails? No  Home free of loose throw rugs in walkways, pet beds, electrical cords, etc? Yes  Adequate lighting in your home to reduce risk of falls? Yes   ASSISTIVE DEVICES UTILIZED TO PREVENT FALLS:  Life alert? No  Use of a cane, walker or w/c? No  Grab bars in the bathroom? No  Shower chair or bench in shower? No  Elevated toilet seat or a handicapped toilet? No   TIMED UP AND GO:  Was the test performed? No . N/a Length of time to ambulate 10 feet: n/a sec.     Cognitive Function:     6CIT Screen 08/17/2021  What Year? 0 points  What month? 0 points  What time? 0 points  Count back from 20 2 points  Months in reverse 2 points  Repeat phrase 6 points  Total Score 10    Immunizations Immunization History  Administered Date(s) Administered   Influenza Whole 09/15/2010   Influenza, High Dose Seasonal PF 09/03/2017, 09/02/2018   Influenza,inj,Quad PF,6+ Mos 07/28/2019, 08/22/2020, 08/09/2021   Influenza-Unspecified 09/17/2017   PFIZER Comirnaty(Gray Top)Covid-19 Tri-Sucrose Vaccine 02/11/2020   PFIZER(Purple Top)SARS-COV-2 Vaccination 03/02/2020, 11/07/2020   Pneumococcal Conjugate-13 05/05/2021   Pneumococcal Polysaccharide-23 09/11/2016    TDAP status: Due, Education has been provided regarding the importance of this vaccine. Advised may receive this vaccine at local pharmacy or Health Dept. Aware to provide a copy of the vaccination record if obtained from local pharmacy or Health  Dept. Verbalized acceptance and understanding.  Flu Vaccine status: Up to date  Pneumococcal vaccine status: Up to date  Covid-19 vaccine status: Completed vaccines  Qualifies for Shingles Vaccine? Yes  Zostavax completed No   Shingrix Completed?: No.    Education has been provided regarding the importance of this vaccine. Patient has been advised to call insurance company to determine out of pocket expense if they have not yet received this vaccine. Advised may also receive vaccine at local pharmacy or Health Dept. Verbalized acceptance and understanding.  Screening Tests Health Maintenance  Topic Date Due   TETANUS/TDAP  Never done   COLONOSCOPY (Pts 45-28yrs Insurance coverage will need to be confirmed)  Never done   COVID-19 Vaccine (4 - Booster for Pfizer series) 03/08/2021   Zoster Vaccines- Shingrix (1 of 2) 11/08/2021 (Originally 08/05/2000)   INFLUENZA VACCINE  Completed   Hepatitis C Screening  Completed   PNA vac Low Risk Adult  Completed   HPV VACCINES  Aged Out    Health Maintenance  Health Maintenance Due  Topic Date Due   TETANUS/TDAP  Never done   COLONOSCOPY (Pts 45-54yrs Insurance coverage will need to be confirmed)  Never done   COVID-19 Vaccine (4 - Booster for Pfizer series) 03/08/2021    Colorectal cancer screening: Referral to GI placed  . Pt aware the office will call re: appt.  Lung Cancer Screening: (Low Dose CT Chest recommended if Age 11-80 years, 30 pack-year currently smoking OR have quit w/in 15years.) does qualify.   Lung Cancer Screening Referral:   Additional Screening:  Hepatitis C Screening: does qualify; Completed 07/28/19  Vision Screening: Recommended annual ophthalmology exams for early detection of glaucoma and other disorders of the eye. Is the patient up to date with their annual eye exam?  no Who is the provider or what is the name of the office in which the patient attends annual eye exams? Never had one  If pt is not  established with a provider, would they like to be referred to a provider  to establish care? No .   Dental Screening: Recommended annual dental exams for proper oral hygiene  Community Resource Referral / Chronic Care Management: CRR required this visit?  No   CCM required this visit?  No      Plan:     I have personally reviewed and noted the following in the patient's chart:   Medical and social history Use of alcohol, tobacco or illicit drugs  Current medications and supplements including opioid prescriptions. Patient is not currently taking opioid prescriptions. Functional ability and status Nutritional status Physical activity Advanced directives List of other physicians Hospitalizations, surgeries, and ER visits in previous 12 months Vitals Screenings to include cognitive, depression, and falls Referrals and appointments  In addition, I have reviewed and discussed with patient certain preventive protocols, quality metrics, and best practice recommendations. A written personalized care plan for preventive services as well as general preventive health recommendations were provided to patient.     Cornelius Moras, [facility]   08/17/2021   Nurse Notes: 60 minute Non Face to Face visit. James Riley , Thank you for taking time to come for your Medicare Wellness Visit. I appreciate your ongoing commitment to your health goals. Please review the following plan we discussed and let me know if I can assist you in the future.   These are the goals we discussed:  Goals   None     This is a list of the screening recommended for you and due dates:  Health Maintenance  Topic Date Due   Tetanus Vaccine  Never done   Colon Cancer Screening  Never done   COVID-19 Vaccine (4 - Booster for Pfizer series) 03/08/2021   Zoster (Shingles) Vaccine (1 of 2) 11/08/2021*   Flu Shot  Completed   Hepatitis C Screening: USPSTF Recommendation to screen - Ages 18-79 yo.  Completed   Pneumonia  vaccines  Completed   HPV Vaccine  Aged Out  *Topic was postponed. The date shown is not the original due date.

## 2021-08-17 NOTE — Patient Instructions (Signed)
Health Maintenance, Male Adopting a healthy lifestyle and getting preventive care are important in promoting health and wellness. Ask your health care provider about: The right schedule for you to have regular tests and exams. Things you can do on your own to prevent diseases and keep yourself healthy. What should I know about diet, weight, and exercise? Eat a healthy diet  Eat a diet that includes plenty of vegetables, fruits, low-fat dairy products, and lean protein. Do not eat a lot of foods that are high in solid fats, added sugars, or sodium. Maintain a healthy weight Body mass index (BMI) is a measurement that can be used to identify possible weight problems. It estimates body fat based on height and weight. Your health care provider can help determine your BMI and help you achieve or maintain a healthy weight. Get regular exercise Get regular exercise. This is one of the most important things you can do for your health. Most adults should: Exercise for at least 150 minutes each week. The exercise should increase your heart rate and make you sweat (moderate-intensity exercise). Do strengthening exercises at least twice a week. This is in addition to the moderate-intensity exercise. Spend less time sitting. Even light physical activity can be beneficial. Watch cholesterol and blood lipids Have your blood tested for lipids and cholesterol at 71 years of age, then have this test every 5 years. You may need to have your cholesterol levels checked more often if: Your lipid or cholesterol levels are high. You are older than 71 years of age. You are at high risk for heart disease. What should I know about cancer screening? Many types of cancers can be detected early and may often be prevented. Depending on your health history and family history, you may need to have cancer screening at various ages. This may include screening for: Colorectal cancer. Prostate cancer. Skin cancer. Lung  cancer. What should I know about heart disease, diabetes, and high blood pressure? Blood pressure and heart disease High blood pressure causes heart disease and increases the risk of stroke. This is more likely to develop in people who have high blood pressure readings, are of African descent, or are overweight. Talk with your health care provider about your target blood pressure readings. Have your blood pressure checked: Every 3-5 years if you are 18-39 years of age. Every year if you are 40 years old or older. If you are between the ages of 65 and 75 and are a current or former smoker, ask your health care provider if you should have a one-time screening for abdominal aortic aneurysm (AAA). Diabetes Have regular diabetes screenings. This checks your fasting blood sugar level. Have the screening done: Once every three years after age 45 if you are at a normal weight and have a low risk for diabetes. More often and at a younger age if you are overweight or have a high risk for diabetes. What should I know about preventing infection? Hepatitis B If you have a higher risk for hepatitis B, you should be screened for this virus. Talk with your health care provider to find out if you are at risk for hepatitis B infection. Hepatitis C Blood testing is recommended for: Everyone born from 1945 through 1965. Anyone with known risk factors for hepatitis C. Sexually transmitted infections (STIs) You should be screened each year for STIs, including gonorrhea and chlamydia, if: You are sexually active and are younger than 71 years of age. You are older than 71 years   of age and your health care provider tells you that you are at risk for this type of infection. Your sexual activity has changed since you were last screened, and you are at increased risk for chlamydia or gonorrhea. Ask your health care provider if you are at risk. Ask your health care provider about whether you are at high risk for HIV.  Your health care provider may recommend a prescription medicine to help prevent HIV infection. If you choose to take medicine to prevent HIV, you should first get tested for HIV. You should then be tested every 3 months for as long as you are taking the medicine. Follow these instructions at home: Lifestyle Do not use any products that contain nicotine or tobacco, such as cigarettes, e-cigarettes, and chewing tobacco. If you need help quitting, ask your health care provider. Do not use street drugs. Do not share needles. Ask your health care provider for help if you need support or information about quitting drugs. Alcohol use Do not drink alcohol if your health care provider tells you not to drink. If you drink alcohol: Limit how much you have to 0-2 drinks a day. Be aware of how much alcohol is in your drink. In the [place] one drink equals one 12 oz bottle of beer (355 mL), one 5 oz glass of wine (148 mL), or one 1 oz glass of hard liquor (44 mL). General instructions Schedule regular health, dental, and eye exams. Stay current with your vaccines. Tell your health care provider if: You often feel depressed. You have ever been abused or do not feel safe at home. Summary Adopting a healthy lifestyle and getting preventive care are important in promoting health and wellness. Follow your health care provider's instructions about healthy diet, exercising, and getting tested or screened for diseases. Follow your health care provider's instructions on monitoring your cholesterol and blood pressure. This information is not intended to replace advice given to you by your health care provider. Make sure you discuss any questions you have with your health care provider. Document Revised: 01/27/2021 Document Reviewed: 11/12/2018 Elsevier Patient Education  2022 Elsevier Inc.  

## 2021-08-20 ENCOUNTER — Encounter: Payer: Self-pay | Admitting: Nurse Practitioner

## 2021-08-24 DIAGNOSIS — N401 Enlarged prostate with lower urinary tract symptoms: Secondary | ICD-10-CM | POA: Diagnosis not present

## 2021-08-24 DIAGNOSIS — R3911 Hesitancy of micturition: Secondary | ICD-10-CM | POA: Diagnosis not present

## 2021-08-24 DIAGNOSIS — N5201 Erectile dysfunction due to arterial insufficiency: Secondary | ICD-10-CM | POA: Diagnosis not present

## 2021-09-01 ENCOUNTER — Encounter: Payer: Self-pay | Admitting: Gastroenterology

## 2021-09-01 ENCOUNTER — Ambulatory Visit: Payer: Medicare HMO | Admitting: Gastroenterology

## 2021-09-01 VITALS — BP 140/80 | HR 69 | Ht 70.0 in | Wt 187.0 lb

## 2021-09-01 DIAGNOSIS — Z86010 Personal history of colonic polyps: Secondary | ICD-10-CM

## 2021-09-01 DIAGNOSIS — K5900 Constipation, unspecified: Secondary | ICD-10-CM | POA: Diagnosis not present

## 2021-09-01 NOTE — Progress Notes (Signed)
HPI : James Riley is a very pleasant 70 year old male with a history of hypertension, chronic tobacco and a reported remote history of stroke who is referred to [place] by Bertram Denver, NP for screening colonoscopy.  The patient states that he has never had a colonoscopy however, it appears that he had an outaptient colonoscopy in 2011 performed by Dr. Dulce Sellar to evaluate hematochezia.  Four tubular adenomas were removed at that time.  He reports chronic constipation, sometimes going only once a week.  He sometimes takes OTC miralax which does help.  His referral indicated that he had been seeing blood in the stool, but the patient denies seeing any blood in the stool, in the water, or on the toilet paper. No family history of colon cancer.  No abdominal pain.  No diarrhea.  No GERD symptoms.  No dysphagia. Weight stable. He denies any chest pain or chest pressure.  No shortness of breath.  No orthopnea/leg swelling.  No wheezing/cough   Past Medical History:  Diagnosis Date   Hypercholesterolemia    Hypertension    Stroke [facility] ~ 1989   "little weak on my right side since" (09/10/2016)     Past Surgical History:  Procedure Laterality Date   APPENDECTOMY     LAPAROSCOPIC APPENDECTOMY N/A 09/07/2016   Procedure: APPENDECTOMY LAPAROSCOPIC converted to open appendectomy;  Surgeon: Abigail Miyamoto, MD;  Location: [facility] OR;  Service: General;  Laterality: N/A;   Family History  Problem Relation Age of Onset   Diabetes Neg Hx    Hypertension Neg Hx    Colon cancer Neg Hx    Pancreatic cancer Neg Hx    Esophageal cancer Neg Hx    Liver cancer Neg Hx    Stomach cancer Neg Hx    Social History   Tobacco Use   Smoking status: Every Day    Packs/day: 0.50    Years: 50.00    Pack years: 25.00    Types: Cigarettes   Smokeless tobacco: Never  Vaping Use   Vaping Use: Never used  Substance Use Topics   Alcohol use: Not Currently    Alcohol/week: 1.0 standard drink    Types: 1 Cans of  beer per week    Comment: a beer once a week. As of July 2020 reports no alcohol at all   Drug use: No   Current Outpatient Medications  Medication Sig Dispense Refill   amLODipine (NORVASC) 10 MG tablet TAKE 1 TABLET (10 MG TOTAL) BY MOUTH DAILY. 90 tablet 0   atorvastatin (LIPITOR) 20 MG tablet TAKE 1 TABLET (20 MG TOTAL) BY MOUTH DAILY. 90 tablet 0   losartan (COZAAR) 50 MG tablet TAKE 1 TABLET (50 MG TOTAL) BY MOUTH DAILY. 90 tablet 0   spironolactone (ALDACTONE) 25 MG tablet TAKE 1 TABLET (25 MG TOTAL) BY MOUTH DAILY. 90 tablet 0   tadalafil (CIALIS) 5 MG tablet Take 1 tablet (5 mg total) by mouth daily as needed for erectile dysfunction. 30 tablet 1   No current facility-administered medications for this visit.   No Known Allergies   Review of Systems: All systems reviewed and negative except where noted in HPI.    No results found.  Physical Exam: BP 140/80   Pulse 69   Ht 5\' 10"  (1.778 m)   Wt 187 lb (84.8 kg)   SpO2 98%   BMI 26.83 kg/m  Constitutional: Pleasant,well-developed, African American male in no acute distress. HEENT: Normocephalic and atraumatic. Conjunctivae are normal. No scleral  icterus.  Mostly edentulous, a few lower teeth, none are loose per the patient Cardiovascular: Normal rate, regular rhythm.  Pulmonary/chest: Effort normal and breath sounds normal. No wheezing, rales or rhonchi. Abdominal: Soft, nondistended, nontender. Bowel sounds active throughout. There are no masses palpable. No hepatomegaly. Extremities: no edema Neurological: Alert and oriented to person place and time. Skin: Skin is warm and dry. No rashes noted. Psychiatric: Normal mood and affect. Behavior is normal.  CBC    Component Value Date/Time   WBC 5.8 08/09/2021 0947   WBC 7.8 06/04/2019 0949   RBC 4.71 08/09/2021 0947   RBC 4.45 06/04/2019 0949   HGB 15.3 08/09/2021 0947   HCT 43.6 08/09/2021 0947   PLT 174 08/09/2021 0947   MCV 93 08/09/2021 0947   MCH 32.5  08/09/2021 0947   MCH 32.4 06/04/2019 0949   MCHC 35.1 08/09/2021 0947   MCHC 35.9 06/04/2019 0949   RDW 13.2 08/09/2021 0947   LYMPHSABS 2.4 08/09/2021 0947   MONOABS 0.7 06/03/2019 2005   EOSABS 0.2 08/09/2021 0947   BASOSABS 0.1 08/09/2021 0947    CMP     Component Value Date/Time   NA 138 08/09/2021 0947   K 4.1 08/09/2021 0947   CL 101 08/09/2021 0947   CO2 20 08/09/2021 0947   GLUCOSE 104 (H) 08/09/2021 0947   GLUCOSE 103 (H) 06/03/2019 2019   BUN 7 (L) 08/09/2021 0947   CREATININE 1.08 08/09/2021 0947   CALCIUM 10.0 08/09/2021 0947   PROT 8.1 08/09/2021 0947   ALBUMIN 5.0 (H) 08/09/2021 0947   AST 15 08/09/2021 0947   ALT 12 08/09/2021 0947   ALKPHOS 105 08/09/2021 0947   BILITOT 0.5 08/09/2021 0947   GFRNONAA 58 (L) 12/30/2020 0918   GFRAA 67 12/30/2020 0918    Colonoscopy 2011 (Dr. Dulce Sellar): 4 polyps, all tubular adenomas   ASSESSMENT AND PLAN: 70 year old male with a personal history of colon polyps (4 tubular adenomas 2011) with chronic constipation, overdue for surveillance colonoscopy.  He has no known cardiopulmonary comorbidities or symptoms that would preclude an elective sedated procedure.  Will schedule for routine surveillance colonoscopy.  Due to his infrequent bowel movements, I recommend a two day bowel prep to ensure adequate prep. For his constipation, I recommended he just the Miralax on a daily basis to give him more regular bowel movements.   Personal history of colon polyps - Surveillance colonoscopy,  2 day prep  Constipation - Use Miralax daily for constipation   The details, risks (including bleeding, perforation, infection, missed lesions, medication reactions and possible hospitalization or surgery if complications occur), benefits, and alternatives to colonoscopy with possible biopsy and possible polypectomy were discussed with the patient and he consents to proceed.    Bettey Muraoka E. Tomasa Rand, MD [facility]  CC:   Claiborne Rigg, NP

## 2021-09-01 NOTE — Patient Instructions (Signed)
If you are age 71 or older, your body mass index should be between 23-30. Your Body mass index is 26.83 kg/m. If this is out of the aforementioned range listed, please consider follow up with your Primary Care Provider.  If you are age 33 or younger, your body mass index should be between 19-25. Your Body mass index is 26.83 kg/m. If this is out of the aformentioned range listed, please consider follow up with your Primary Care Provider.   You have been scheduled for a colonoscopy. Please follow written instructions given to you at your visit today.  Please pick up your prep supplies at the pharmacy within the next 1-3 days. If you use inhalers (even only as needed), please bring them with you on the day of your procedure.   The [facility] providers would like to encourage you to use [facility] to communicate with providers for non-urgent requests or questions.  Due to long hold times on the telephone, sending your provider a message by [facility] may be a faster and more efficient way to get a response.  Please allow 48 business hours for a response.  Please remember that this is for non-urgent requests.    Due to recent changes in healthcare laws, you may see the results of your imaging and laboratory studies on MyChart before your provider has had a chance to review them.  We understand that in some cases there may be results that are confusing or concerning to you. Not all laboratory results come back in the same time frame and the provider may be waiting for multiple results in order to interpret others.  Please give [place] 48 hours in order for your provider to thoroughly review all the results before contacting the office for clarification of your results.   It was a pleasure to see you today!  Thank you for trusting me with your gastrointestinal care!    Scott E.Tomasa Rand, MD

## 2021-10-02 ENCOUNTER — Telehealth: Payer: Self-pay | Admitting: Nurse Practitioner

## 2021-10-02 ENCOUNTER — Other Ambulatory Visit: Payer: Self-pay

## 2021-10-02 DIAGNOSIS — I10 Essential (primary) hypertension: Secondary | ICD-10-CM

## 2021-10-02 DIAGNOSIS — E782 Mixed hyperlipidemia: Secondary | ICD-10-CM

## 2021-10-02 MED ORDER — SPIRONOLACTONE 25 MG PO TABS
ORAL_TABLET | Freq: Every day | ORAL | 0 refills | Status: DC
  Filled 2021-10-02: qty 90, 90d supply, fill #0

## 2021-10-02 MED ORDER — ATORVASTATIN CALCIUM 20 MG PO TABS
ORAL_TABLET | Freq: Every day | ORAL | 0 refills | Status: DC
  Filled 2021-10-02: qty 90, 90d supply, fill #0

## 2021-10-02 MED ORDER — AMLODIPINE BESYLATE 10 MG PO TABS
ORAL_TABLET | Freq: Every day | ORAL | 0 refills | Status: DC
Start: 2021-10-02 — End: 2022-01-03
  Filled 2021-10-02: qty 90, 90d supply, fill #0

## 2021-10-02 MED ORDER — LOSARTAN POTASSIUM 50 MG PO TABS
ORAL_TABLET | Freq: Every day | ORAL | 0 refills | Status: DC
Start: 2021-10-02 — End: 2022-01-03
  Filled 2021-10-02: qty 90, 90d supply, fill #0

## 2021-10-02 NOTE — Telephone Encounter (Signed)
Rx sent. Thanks for all you do! ?

## 2021-10-02 NOTE — Telephone Encounter (Signed)
1) Medication(s) Requested (by name):  amLODipine (NORVASC) 10 MG tablet  atorvastatin (LIPITOR) 20 MG tablet  losartan (COZAAR) 50 MG tablet  spironolactone (ALDACTONE) 25 MG tablet  2) Pharmacy of Choice:   [facility]   3) Special Requests:   Approved medications will be sent to the pharmacy, we will reach out if there is an issue.  Requests made after 3pm may not be addressed until the following business day!  If a patient is unsure of the name of the medication(s) please note and ask patient to call back when they are able to provide all info, do not send to responsible party until all information is available!

## 2021-10-30 ENCOUNTER — Encounter: Payer: Self-pay | Admitting: Certified Registered Nurse Anesthetist

## 2021-10-31 ENCOUNTER — Encounter: Payer: Medicare HMO | Admitting: Gastroenterology

## 2021-10-31 ENCOUNTER — Telehealth: Payer: Self-pay | Admitting: Gastroenterology

## 2021-10-31 NOTE — Telephone Encounter (Signed)
Good Morning Dr. Tomasa Rand  Called patient to see  if he would be coming for his procedure today.   I spoke with his brother whom stated that patient has been sick for a few weeks and was sure he did not prep. I NO SHOWED patient.  His brother stated that he would call back to reschedule.

## 2021-11-08 ENCOUNTER — Ambulatory Visit: Payer: Medicare HMO | Attending: Nurse Practitioner | Admitting: Nurse Practitioner

## 2021-11-08 ENCOUNTER — Other Ambulatory Visit: Payer: Self-pay

## 2021-11-08 ENCOUNTER — Encounter: Payer: Self-pay | Admitting: Nurse Practitioner

## 2021-11-08 VITALS — BP 127/72 | HR 76 | Ht 70.0 in | Wt 177.2 lb

## 2021-11-08 DIAGNOSIS — R413 Other amnesia: Secondary | ICD-10-CM | POA: Diagnosis not present

## 2021-11-08 DIAGNOSIS — R7303 Prediabetes: Secondary | ICD-10-CM

## 2021-11-08 DIAGNOSIS — Z8673 Personal history of transient ischemic attack (TIA), and cerebral infarction without residual deficits: Secondary | ICD-10-CM

## 2021-11-08 DIAGNOSIS — I10 Essential (primary) hypertension: Secondary | ICD-10-CM

## 2021-11-08 DIAGNOSIS — N521 Erectile dysfunction due to diseases classified elsewhere: Secondary | ICD-10-CM | POA: Diagnosis not present

## 2021-11-08 MED ORDER — SILDENAFIL CITRATE 100 MG PO TABS
50.0000 mg | ORAL_TABLET | Freq: Every day | ORAL | 11 refills | Status: DC | PRN
Start: 2021-11-08 — End: 2022-06-06
  Filled 2021-11-08: qty 10, 25d supply, fill #0

## 2021-11-08 NOTE — Progress Notes (Signed)
Assessment & Plan:  James Riley was seen today for hypertension.  Diagnoses and all orders for this visit:  Primary hypertension -     CMP14+EGFR  Prediabetes -     Hemoglobin A1c  Short-term memory loss -     Ambulatory referral to Neurology  History of stroke -     Ambulatory referral to Neurology  Erectile dysfunction due to diseases classified elsewhere -     sildenafil (VIAGRA) 100 MG tablet; Take 0.5-1 tablets (50-100 mg total) by mouth daily as needed for erectile dysfunction.   Patient has been counseled on age-appropriate routine health concerns for screening and prevention. These are reviewed and up-to-date. Referrals have been placed accordingly. Immunizations are up-to-date or declined.    Subjective:   Chief Complaint  Patient presents with   Hypertension   [name] 70 y.o. male presents to office today for follow up to HTN  He has a past medical history of Hypercholesterolemia, Hypertension, and Stroke ([facility]) (~ 1989).    HTN Blood pressures well controlled with amlodipine 10 mg daily, losartan 50 mg daily and spironolactone 25 mg daily BP Readings from Last 3 Encounters:  11/08/21 127/72  09/01/21 140/80  08/09/21 127/73     GU Problem He has a history of BPH with urinary retention as well as erectile dysfunction.  He is requesting a refill of sildenafil today however he currently has a prescription which was prescribed by me a few months ago.  He cannot recall if he ever started taking the Cialis or if the Cialis was effective.  However he does endorse intermittent urinary retention.  I have referred him back to urology for this.  He is unable to give a urine specimen at this time.  Denies any flank pain or hematuria.  Prediabetes Well-controlled without the use of any diabetic medications Lab Results  Component Value Date   HGBA1C 5.9 (H) 08/09/2021    Memory loss He has a history of stroke however this stroke was over 15 years ago.  He is  unable to recall certain names of friends, medications, appointments or office visits he is recently had. After discussing his medications and treatment plan in detail today he could not recall any of the information I have given him within 2 minutes.  I am not sure James Riley has a learning disability or needs to be evaluated for early dementia however I will be referring him back to his neurologist for further testing    Review of Systems  Constitutional:  Negative for fever, malaise/fatigue and weight loss.  HENT: Negative.  Negative for nosebleeds.   Eyes: Negative.  Negative for blurred vision, double vision and photophobia.  Respiratory: Negative.  Negative for cough and shortness of breath.   Cardiovascular: Negative.  Negative for chest pain, palpitations and leg swelling.  Gastrointestinal:  Positive for constipation. Negative for blood in stool, heartburn, melena, nausea and vomiting.  Genitourinary:        See HPI  Musculoskeletal: Negative.  Negative for myalgias.  Neurological: Negative.  Negative for dizziness, focal weakness, seizures and headaches.  Psychiatric/Behavioral:  Positive for memory loss. Negative for suicidal ideas.    Past Medical History:  Diagnosis Date   Hypercholesterolemia    Hypertension    Stroke [facility]) ~ 1989   "little weak on my right side since" (09/10/2016)    Past Surgical History:  Procedure Laterality Date   APPENDECTOMY     LAPAROSCOPIC APPENDECTOMY N/A 09/07/2016   Procedure:  APPENDECTOMY LAPAROSCOPIC converted to open appendectomy;  Surgeon: Coralie Keens, MD;  Location: [facility] OR;  Service: General;  Laterality: N/A;    Family History  Problem Relation Age of Onset   Diabetes Neg Hx    Hypertension Neg Hx    Colon cancer Neg Hx    Pancreatic cancer Neg Hx    Esophageal cancer Neg Hx    Liver cancer Neg Hx    Stomach cancer Neg Hx     Social History Reviewed with no changes to be made today.   Outpatient Medications Prior to  Visit  Medication Sig Dispense Refill   amLODipine (NORVASC) 10 MG tablet TAKE 1 TABLET (10 MG TOTAL) BY MOUTH DAILY. 90 tablet 0   atorvastatin (LIPITOR) 20 MG tablet TAKE 1 TABLET (20 MG TOTAL) BY MOUTH DAILY. 90 tablet 0   losartan (COZAAR) 50 MG tablet TAKE 1 TABLET (50 MG TOTAL) BY MOUTH DAILY. 90 tablet 0   spironolactone (ALDACTONE) 25 MG tablet TAKE 1 TABLET (25 MG TOTAL) BY MOUTH DAILY. 90 tablet 0   tadalafil (CIALIS) 5 MG tablet Take 1 tablet (5 mg total) by mouth daily as needed for erectile dysfunction. 30 tablet 1   No facility-administered medications prior to visit.    No Known Allergies     Objective:    BP 127/72   Pulse 76   Ht '5\' 10"'  (1.778 m)   Wt 177 lb 4 oz (80.4 kg)   SpO2 99%   BMI 25.43 kg/m  Wt Readings from Last 3 Encounters:  11/08/21 177 lb 4 oz (80.4 kg)  09/01/21 187 lb (84.8 kg)  08/09/21 187 lb (84.8 kg)    Physical Exam Vitals and nursing note reviewed.  Constitutional:      Appearance: He is well-developed.  HENT:     Head: Normocephalic and atraumatic.  Cardiovascular:     Rate and Rhythm: Normal rate and regular rhythm.     Heart sounds: Normal heart sounds. No murmur heard.   No friction rub. No gallop.  Pulmonary:     Effort: Pulmonary effort is normal. No tachypnea or respiratory distress.     Breath sounds: Normal breath sounds. No decreased breath sounds, wheezing, rhonchi or rales.  Chest:     Chest wall: No tenderness.  Abdominal:     General: Bowel sounds are normal.     Palpations: Abdomen is soft.  Musculoskeletal:        General: Normal range of motion.     Cervical back: Normal range of motion.  Skin:    General: Skin is warm and dry.  Neurological:     Mental Status: He is alert and oriented to person, place, and time.     Coordination: Coordination normal.  Psychiatric:        Behavior: Behavior normal. Behavior is cooperative.        Thought Content: Thought content normal.        Judgment: Judgment normal.          Patient has been counseled extensively about nutrition and exercise as well as the importance of adherence with medications and regular follow-up. The patient was given clear instructions to go to ER or return to medical center if symptoms don't improve, worsen or new problems develop. The patient verbalized understanding.   Follow-up: Return in about 3 months (around 02/06/2022).   [name], FNP-BC [facility] [place], [place]   11/08/2021, 1:17 PM

## 2021-11-08 NOTE — Patient Instructions (Addendum)
Go to [facility] and ask them how much is your current bottle sildenafil and how much is the bottle of sildenafil Ms. James Riley filled for you today.    Please call [facility] to discuss erectile dysfunction.  Need to schedule with [facility] for urinary retention.  [facility] Address: [address] [place], [place] [address] Phone: [phone]   [facility] Address: [address] [place], [place] [address] Phone: [phone] Call STOMACH DR TO SCHEDULE YOUR COLONOSCOPY  CALL [facility] for your memory Neurologist in [place] Address: [address], [place], [place] [address] Phone: [phone]

## 2021-11-09 ENCOUNTER — Encounter: Payer: Self-pay | Admitting: Gastroenterology

## 2021-11-09 LAB — CMP14+EGFR
ALT: 18 IU/L (ref 0–44)
AST: 19 IU/L (ref 0–40)
Albumin/Globulin Ratio: 1.6 (ref 1.2–2.2)
Albumin: 4.9 g/dL — ABNORMAL HIGH (ref 3.7–4.7)
Alkaline Phosphatase: 107 IU/L (ref 44–121)
BUN/Creatinine Ratio: 9 — ABNORMAL LOW (ref 10–24)
BUN: 10 mg/dL (ref 8–27)
Bilirubin Total: 0.5 mg/dL (ref 0.0–1.2)
CO2: 22 mmol/L (ref 20–29)
Calcium: 9.8 mg/dL (ref 8.6–10.2)
Chloride: 102 mmol/L (ref 96–106)
Creatinine, Ser: 1.17 mg/dL (ref 0.76–1.27)
Globulin, Total: 3.1 g/dL (ref 1.5–4.5)
Glucose: 95 mg/dL (ref 70–99)
Potassium: 4.2 mmol/L (ref 3.5–5.2)
Sodium: 138 mmol/L (ref 134–144)
Total Protein: 8 g/dL (ref 6.0–8.5)
eGFR: 67 mL/min/{1.73_m2} (ref >59)

## 2021-11-09 LAB — HEMOGLOBIN A1C
Est. average glucose Bld gHb Est-mCnc: 128 mg/dL
Hgb A1c MFr Bld: 6.1 % — ABNORMAL HIGH (ref 4.8–5.6)

## 2021-12-01 DIAGNOSIS — N5201 Erectile dysfunction due to arterial insufficiency: Secondary | ICD-10-CM | POA: Diagnosis not present

## 2021-12-01 DIAGNOSIS — R3911 Hesitancy of micturition: Secondary | ICD-10-CM | POA: Diagnosis not present

## 2021-12-01 DIAGNOSIS — N401 Enlarged prostate with lower urinary tract symptoms: Secondary | ICD-10-CM | POA: Diagnosis not present

## 2022-01-03 ENCOUNTER — Other Ambulatory Visit: Payer: Self-pay

## 2022-01-03 ENCOUNTER — Other Ambulatory Visit: Payer: Self-pay | Admitting: Nurse Practitioner

## 2022-01-03 DIAGNOSIS — I10 Essential (primary) hypertension: Secondary | ICD-10-CM

## 2022-01-03 DIAGNOSIS — E782 Mixed hyperlipidemia: Secondary | ICD-10-CM

## 2022-01-03 MED ORDER — AMLODIPINE BESYLATE 10 MG PO TABS
ORAL_TABLET | Freq: Every day | ORAL | 0 refills | Status: DC
  Filled 2022-01-03: qty 90, 90d supply, fill #0

## 2022-01-03 MED ORDER — SPIRONOLACTONE 25 MG PO TABS
ORAL_TABLET | Freq: Every day | ORAL | 0 refills | Status: DC
  Filled 2022-01-03: qty 90, 90d supply, fill #0

## 2022-01-03 MED ORDER — ATORVASTATIN CALCIUM 20 MG PO TABS
ORAL_TABLET | Freq: Every day | ORAL | 0 refills | Status: DC
  Filled 2022-01-03: qty 90, 90d supply, fill #0

## 2022-01-03 MED ORDER — LOSARTAN POTASSIUM 50 MG PO TABS
ORAL_TABLET | Freq: Every day | ORAL | 0 refills | Status: DC
  Filled 2022-01-03: qty 90, 90d supply, fill #0

## 2022-01-03 NOTE — Telephone Encounter (Signed)
Requested Prescriptions  Pending Prescriptions Disp Refills   amLODipine (NORVASC) 10 MG tablet 90 tablet 0    Sig: TAKE 1 TABLET (10 MG TOTAL) BY MOUTH DAILY.     Cardiovascular: Calcium Channel Blockers 2 Passed - 01/03/2022  9:22 AM      Passed - Last BP in normal range    BP Readings from Last 1 Encounters:  11/08/21 127/72         Passed - Last Heart Rate in normal range    Pulse Readings from Last 1 Encounters:  11/08/21 76         Passed - Valid encounter within last 6 months    Recent Outpatient Visits          1 month ago Primary hypertension   [place], Vernia Buff, NP   4 months ago Essential hypertension   [place], Vernia Buff, NP   8 months ago Primary hypertension   [place], Vernia Buff, NP   1 year ago Chronic constipation   [place], Vernia Buff, NP   1 year ago Essential hypertension   [place], NP              atorvastatin (LIPITOR) 20 MG tablet 90 tablet 0    Sig: TAKE 1 TABLET (20 MG TOTAL) BY MOUTH DAILY.     Cardiovascular:  Antilipid - Statins Failed - 01/03/2022  9:22 AM      Failed - Lipid Panel in normal range within the last 12 months    Cholesterol, Total  Date Value Ref Range Status  05/05/2021 127 100 - 199 mg/dL Final   LDL Chol Calc (NIH)  Date Value Ref Range Status  05/05/2021 72 0 - 99 mg/dL Final   HDL  Date Value Ref Range Status  05/05/2021 34 (L) >39 mg/dL Final   Triglycerides  Date Value Ref Range Status  05/05/2021 116 0 - 149 mg/dL Final         Passed - Patient is not pregnant      Passed - Valid encounter within last 12 months    Recent Outpatient Visits          1 month ago Primary hypertension   [place], Vernia Buff, NP   4 months ago Essential hypertension   [place], Vernia Buff, NP   8 months ago Primary hypertension   [place], Vernia Buff, NP   1 year ago Chronic constipation   [place], Vernia Buff, NP   1 year ago Essential hypertension   [place], NP              losartan (COZAAR) 50 MG tablet 90 tablet 0    Sig: TAKE 1 TABLET (50 MG TOTAL) BY MOUTH DAILY.     Cardiovascular:  Angiotensin Receptor Blockers Passed - 01/03/2022  9:22 AM      Passed - Cr in normal range and within 180 days    Creatinine, Ser  Date Value Ref Range Status  11/08/2021 1.17 0.76 - 1.27 mg/dL Final   Creatinine, POC  Date Value Ref Range Status  04/05/2017 300 mg/dL Final   Creatinine, Urine  Date Value  Ref Range Status  06/03/2019 29.13 mg/dL Final    Comment:    Performed at [facility] [address] [place], [place] [address]         Passed - K in normal range and within 180 days    Potassium  Date Value Ref Range Status  11/08/2021 4.2 3.5 - 5.2 mmol/L Final         Passed - Patient is not pregnant      Passed - Last BP in normal range    BP Readings from Last 1 Encounters:  11/08/21 127/72         Passed - Valid encounter within last 6 months    Recent Outpatient Visits          1 month ago Primary hypertension   [place], Vernia Buff, NP   4 months ago Essential hypertension   [place], Vernia Buff, NP   8 months ago Primary hypertension   [place], Vernia Buff, NP   1 year ago Chronic constipation   [place], Vernia Buff, NP   1 year ago Essential hypertension   [place], NP              spironolactone (ALDACTONE) 25 MG tablet 90 tablet 0    Sig: TAKE 1 TABLET (25  MG TOTAL) BY MOUTH DAILY.     Cardiovascular: Diuretics - Aldosterone Antagonist Passed - 01/03/2022  9:22 AM      Passed - Cr in normal range and within 180 days    Creatinine, Ser  Date Value Ref Range Status  11/08/2021 1.17 0.76 - 1.27 mg/dL Final   Creatinine, POC  Date Value Ref Range Status  04/05/2017 300 mg/dL Final   Creatinine, Urine  Date Value Ref Range Status  06/03/2019 29.13 mg/dL Final    Comment:    Performed at [facility], [place] [address] [place], [place] [address]         Passed - K in normal range and within 180 days    Potassium  Date Value Ref Range Status  11/08/2021 4.2 3.5 - 5.2 mmol/L Final         Passed - Na in normal range and within 180 days    Sodium  Date Value Ref Range Status  11/08/2021 138 134 - 144 mmol/L Final         Passed - eGFR is 30 or above and within 180 days    GFR calc Af Amer  Date Value Ref Range Status  12/30/2020 67 >59 mL/min/1.73 Final    Comment:    **In accordance with recommendations from the NKF-ASN Task force,**   [facility] is in the process of updating its eGFR calculation to the   2021 CKD-EPI creatinine equation that estimates kidney function   without a race variable.    GFR calc non Af Amer  Date Value Ref Range Status  12/30/2020 58 (L) >59 mL/min/1.73 Final   eGFR  Date Value Ref Range Status  11/08/2021 67 >59 mL/min/1.73 Final         Passed - Last BP in normal range    BP Readings from Last 1 Encounters:  11/08/21 127/72         Passed - Valid encounter within last 6 months    Recent Outpatient  Visits          1 month ago Primary hypertension   [place], Vernia Buff, NP   4 months ago Essential hypertension   [place], Vernia Buff, NP   8 months ago Primary hypertension   [place], Vernia Buff, NP   1 year ago Chronic constipation   [place], Vernia Buff, NP   1 year ago Essential hypertension   [place], Vernia Buff, NP

## 2022-01-04 ENCOUNTER — Telehealth: Payer: Self-pay | Admitting: *Deleted

## 2022-01-04 NOTE — Telephone Encounter (Signed)
Patient no showed PV today- Called patient at 131 pm- LM to return call, 135 pm- lm to return call , 141 pm  called and left message to return call by 5 pm today- If no call by 5 pm, PV and procedure will be canceled - no call at 5 pm -  PV and Procedure both canceled- No Show letter mailed to patient

## 2022-01-08 ENCOUNTER — Other Ambulatory Visit: Payer: Self-pay

## 2022-01-11 ENCOUNTER — Institutional Professional Consult (permissible substitution): Payer: Medicare HMO | Admitting: Neurology

## 2022-01-18 ENCOUNTER — Encounter: Payer: Medicare HMO | Admitting: Gastroenterology

## 2022-04-09 ENCOUNTER — Other Ambulatory Visit: Payer: Self-pay | Admitting: Nurse Practitioner

## 2022-04-09 ENCOUNTER — Other Ambulatory Visit: Payer: Self-pay

## 2022-04-09 DIAGNOSIS — I10 Essential (primary) hypertension: Secondary | ICD-10-CM

## 2022-04-09 DIAGNOSIS — E782 Mixed hyperlipidemia: Secondary | ICD-10-CM

## 2022-04-10 ENCOUNTER — Other Ambulatory Visit: Payer: Self-pay

## 2022-04-10 MED ORDER — SPIRONOLACTONE 25 MG PO TABS
ORAL_TABLET | Freq: Every day | ORAL | 0 refills | Status: DC
  Filled 2022-04-10: qty 90, 90d supply, fill #0

## 2022-04-10 MED ORDER — ATORVASTATIN CALCIUM 20 MG PO TABS
ORAL_TABLET | Freq: Every day | ORAL | 0 refills | Status: DC
  Filled 2022-04-10: qty 90, 90d supply, fill #0

## 2022-04-10 MED ORDER — LOSARTAN POTASSIUM 50 MG PO TABS
ORAL_TABLET | Freq: Every day | ORAL | 0 refills | Status: DC
  Filled 2022-04-10: qty 90, 90d supply, fill #0

## 2022-04-10 MED ORDER — AMLODIPINE BESYLATE 10 MG PO TABS
ORAL_TABLET | Freq: Every day | ORAL | 0 refills | Status: DC
  Filled 2022-04-10: qty 90, 90d supply, fill #0

## 2022-04-10 NOTE — Telephone Encounter (Signed)
Patient will need an office visit for further refills. ?Requested Prescriptions  ?Pending Prescriptions Disp Refills  ?? amLODipine (NORVASC) 10 MG tablet 90 tablet 0  ?  Sig: TAKE 1 TABLET (10 MG TOTAL) BY MOUTH DAILY.  ?  ? Cardiovascular: Calcium Channel Blockers 2 Passed - 04/09/2022 11:17 AM  ?  ?  Passed - Last BP in normal range  ?  BP Readings from Last 1 Encounters:  ?11/08/21 127/72  ?   ?  ?  Passed - Last Heart Rate in normal range  ?  Pulse Readings from Last 1 Encounters:  ?11/08/21 76  ?   ?  ?  Passed - Valid encounter within last 6 months  ?  Recent Outpatient Visits   ?      ? 5 months ago Primary hypertension  ? [place], [place] W, NP  ? 8 months ago Essential hypertension  ? [place], [place] W, NP  ? 11 months ago Primary hypertension  ? [place], Vernia Buff, NP  ? 1 year ago Chronic constipation  ? [place], Vernia Buff, NP  ? 1 year ago Essential hypertension  ? [place], [place] W, NP  ?  ?  ? ?  ?  ?  ?? atorvastatin (LIPITOR) 20 MG tablet 90 tablet 0  ?  Sig: TAKE 1 TABLET (20 MG TOTAL) BY MOUTH DAILY.  ?  ? Cardiovascular:  Antilipid - Statins Failed - 04/09/2022 11:17 AM  ?  ?  Failed - Lipid Panel in normal range within the last 12 months  ?  Cholesterol, Total  ?Date Value Ref Range Status  ?05/05/2021 127 100 - 199 mg/dL Final  ? ?LDL Chol Calc (NIH)  ?Date Value Ref Range Status  ?05/05/2021 72 0 - 99 mg/dL Final  ? ?HDL  ?Date Value Ref Range Status  ?05/05/2021 34 (L) >39 mg/dL Final  ? ?Triglycerides  ?Date Value Ref Range Status  ?05/05/2021 116 0 - 149 mg/dL Final  ? ?  ?  ?  Passed - Patient is not pregnant  ?  ?  Passed - Valid encounter within last 12 months  ?  Recent Outpatient Visits   ?      ? 5 months ago Primary hypertension  ? [place], [place]  W, NP  ? 8 months ago Essential hypertension  ? [place], [place] W, NP  ? 11 months ago Primary hypertension  ? [place], Vernia Buff, NP  ? 1 year ago Chronic constipation  ? [place], Vernia Buff, NP  ? 1 year ago Essential hypertension  ? [place], [place] W, NP  ?  ?  ? ?  ?  ?  ?? losartan (COZAAR) 50 MG tablet 90 tablet 0  ?  Sig: TAKE 1 TABLET (50 MG TOTAL) BY MOUTH DAILY.  ?  ? Cardiovascular:  Angiotensin Receptor Blockers Passed - 04/09/2022 11:17 AM  ?  ?  Passed - Cr in normal range and within 180 days  ?  Creatinine, Ser  ?Date Value Ref Range Status  ?11/08/2021 1.17 0.76 - 1.27 mg/dL Final  ? ?Creatinine, POC  ?Date Value Ref Range Status  ?04/05/2017 300 mg/dL Final  ? ?  Creatinine, Urine  ?Date Value Ref Range Status  ?06/03/2019 29.13 mg/dL Final  ?  Comment:  ?  Performed at [facility], [address] [place], [place] [address]  ?   ?  ?  Passed - K in normal range and within 180 days  ?  Potassium  ?Date Value Ref Range Status  ?11/08/2021 4.2 3.5 - 5.2 mmol/L Final  ?   ?  ?  Passed - Patient is not pregnant  ?  ?  Passed - Last BP in normal range  ?  BP Readings from Last 1 Encounters:  ?11/08/21 127/72  ?   ?  ?  Passed - Valid encounter within last 6 months  ?  Recent Outpatient Visits   ?      ? 5 months ago Primary hypertension  ? [facility] [place], [place] W, NP  ? 8 months ago Essential hypertension  ? [place] [facility] [place], Zelda W, NP  ? 11 months ago Primary hypertension  ? [place] [facility] [place], Zelda W, NP  ? 1 year ago Chronic constipation  ? [place] [facility] [place], Zelda W, NP  ? 1 year ago Essential hypertension  ? [place] [facility] [place], Zelda W, NP  ?  ?  ? ?  ?  ?  ?? spironolactone  (ALDACTONE) 25 MG tablet 90 tablet 0  ?  Sig: TAKE 1 TABLET (25 MG TOTAL) BY MOUTH DAILY.  ?  ? Cardiovascular: Diuretics - Aldosterone Antagonist Passed - 04/09/2022 11:17 AM  ?  ?  Passed - Cr in normal range and within 180 days  ?  Creatinine, Ser  ?Date Value Ref Range Status  ?11/08/2021 1.17 0.76 - 1.27 mg/dL Final  ? ?Creatinine, POC  ?Date Value Ref Range Status  ?04/05/2017 300 mg/dL Final  ? ?Creatinine, Urine  ?Date Value Ref Range Status  ?06/03/2019 29.13 mg/dL Final  ?  Comment:  ?  Performed at [facility], [address] [place], [place] [address]  ?   ?  ?  Passed - K in normal range and within 180 days  ?  Potassium  ?Date Value Ref Range Status  ?11/08/2021 4.2 3.5 - 5.2 mmol/L Final  ?   ?  ?  Passed - Na in normal range and within 180 days  ?  Sodium  ?Date Value Ref Range Status  ?11/08/2021 138 134 - 144 mmol/L Final  ?   ?  ?  Passed - eGFR is 30 or above and within 180 days  ?  GFR calc Af Amer  ?Date Value Ref Range Status  ?12/30/2020 67 >59 mL/min/1.73 Final  ?  Comment:  ?  **In accordance with recommendations from the NKF-ASN Task force,** ?  [facility] is in the process of updating its eGFR calculation to the ?  2021 CKD-EPI creatinine equation that estimates kidney function ?  without a race variable. ?  ? ?GFR calc non Af Amer  ?Date Value Ref Range Status  ?12/30/2020 58 (L) >59 mL/min/1.73 Final  ? ?eGFR  ?Date Value Ref Range Status  ?11/08/2021 67 >59 mL/min/1.73 Final  ?   ?  ?  Passed - Last BP in normal range  ?  BP Readings from Last 1 Encounters:  ?11/08/21 127/72  ?   ?  ?  Passed - Valid encounter within last 6 months  ?    Recent Outpatient Visits   ?      ? 5 months ago Primary hypertension  ? [place], [place] W, NP  ? 8 months ago Essential hypertension  ? [place], [place] W, NP  ? 11 months ago Primary hypertension  ? [place], Vernia Buff, NP  ? 1 year  ago Chronic constipation  ? [place], Vernia Buff, NP  ? 1 year ago Essential hypertension  ? [place], Vernia Buff, NP  ?  ?  ? ?  ?  ?  ? ?

## 2022-04-11 ENCOUNTER — Other Ambulatory Visit: Payer: Self-pay

## 2022-05-10 ENCOUNTER — Other Ambulatory Visit: Payer: Self-pay | Admitting: Nurse Practitioner

## 2022-05-10 ENCOUNTER — Other Ambulatory Visit: Payer: Self-pay

## 2022-05-10 DIAGNOSIS — I10 Essential (primary) hypertension: Secondary | ICD-10-CM

## 2022-05-10 DIAGNOSIS — E782 Mixed hyperlipidemia: Secondary | ICD-10-CM

## 2022-05-10 NOTE — Telephone Encounter (Signed)
Rx 04/10/22 #90 for all- too soon Requested Prescriptions  Pending Prescriptions Disp Refills  . amLODipine (NORVASC) 10 MG tablet 90 tablet 0    Sig: TAKE 1 TABLET (10 MG TOTAL) BY MOUTH DAILY.     Cardiovascular: Calcium Channel Blockers 2 Failed - 05/10/2022  9:34 AM      Failed - Valid encounter within last 6 months    Recent Outpatient Visits          6 months ago Primary hypertension   [place], Vernia Buff, NP   9 months ago Essential hypertension   [place], Vernia Buff, NP   1 year ago Primary hypertension   [place], Vernia Buff, NP   1 year ago Chronic constipation   [place], Vernia Buff, NP   1 year ago Essential hypertension   [place], Vernia Buff, NP      Future Appointments            In 3 weeks [place] Donovan, PA-C [place] BP in normal range    BP Readings from Last 1 Encounters:  11/08/21 127/72         Passed - Last Heart Rate in normal range    Pulse Readings from Last 1 Encounters:  11/08/21 76         . atorvastatin (LIPITOR) 20 MG tablet 90 tablet 0    Sig: TAKE 1 TABLET (20 MG TOTAL) BY MOUTH DAILY.     Cardiovascular:  Antilipid - Statins Failed - 05/10/2022  9:34 AM      Failed - Lipid Panel in normal range within the last 12 months    Cholesterol, Total  Date Value Ref Range Status  05/05/2021 127 100 - 199 mg/dL Final   LDL Chol Calc (NIH)  Date Value Ref Range Status  05/05/2021 72 0 - 99 mg/dL Final   HDL  Date Value Ref Range Status  05/05/2021 34 (L) >39 mg/dL Final   Triglycerides  Date Value Ref Range Status  05/05/2021 116 0 - 149 mg/dL Final         Passed - Patient is not pregnant      Passed - Valid encounter within last 12 months    Recent Outpatient Visits          6  months ago Primary hypertension   [place], Vernia Buff, NP   9 months ago Essential hypertension   [place], Vernia Buff, NP   1 year ago Primary hypertension   [place], Vernia Buff, NP   1 year ago Chronic constipation   [place], Vernia Buff, NP   1 year ago Essential hypertension   [place], Vernia Buff, NP      Future Appointments            In 3 weeks [place], Dionne Bucy, PA-C [place]           . losartan (COZAAR) 50 MG tablet 90 tablet 0    Sig: TAKE 1 TABLET (50 MG TOTAL) BY MOUTH DAILY.     Cardiovascular:  Angiotensin Receptor Blockers Failed - 05/10/2022  9:34 AM      Failed - Cr in normal range and within 180 days    Creatinine, Ser  Date Value Ref Range Status  11/08/2021 1.17 0.76 - 1.27 mg/dL Final   Creatinine, POC  Date Value Ref Range Status  04/05/2017 300 mg/dL Final   Creatinine, Urine  Date Value Ref Range Status  06/03/2019 29.13 mg/dL Final    Comment:    Performed at [facility], [address], [place], [place] [address]         Failed - K in normal range and within 180 days    Potassium  Date Value Ref Range Status  11/08/2021 4.2 3.5 - 5.2 mmol/L Final         Failed - Valid encounter within last 6 months    Recent Outpatient Visits          6 months ago Primary hypertension   [place], Vernia Buff, NP   9 months ago Essential hypertension   [place], Vernia Buff, NP   1 year ago Primary hypertension   [place], Vernia Buff, NP   1 year ago Chronic constipation   [place], Vernia Buff, NP   1 year ago Essential hypertension   [place], Vernia Buff, NP      Future Appointments            In 3 weeks [place] Donovan, PA-C [place] - Patient is not pregnant      Passed - Last BP in normal range    BP Readings from Last 1 Encounters:  11/08/21 127/72         . spironolactone (ALDACTONE) 25 MG tablet 90 tablet 0    Sig: TAKE 1 TABLET (25 MG TOTAL) BY MOUTH DAILY.     Cardiovascular: Diuretics - Aldosterone Antagonist Failed - 05/10/2022  9:34 AM      Failed - Cr in normal range and within 180 days    Creatinine, Ser  Date Value Ref Range Status  11/08/2021 1.17 0.76 - 1.27 mg/dL Final   Creatinine, POC  Date Value Ref Range Status  04/05/2017 300 mg/dL Final   Creatinine, Urine  Date Value Ref Range Status  06/03/2019 29.13 mg/dL Final    Comment:    Performed at [facility], [place] [address] [place], [place] [address]         Failed - K in normal range and within 180 days    Potassium  Date Value Ref Range Status  11/08/2021 4.2 3.5 - 5.2 mmol/L Final         Failed - Na in normal range and within 180 days    Sodium  Date Value Ref Range Status  11/08/2021 138 134 - 144 mmol/L Final         Failed - eGFR is 30 or above and within 180 days    GFR calc Af Amer  Date Value Ref Range Status  12/30/2020 67 >59 mL/min/1.73 Final    Comment:    **In accordance with recommendations from the NKF-ASN Task force,**   [facility] is in the process of updating its eGFR calculation to the   2021 CKD-EPI creatinine equation  that estimates kidney function   without a race variable.    GFR calc non Af Amer  Date Value Ref Range Status  12/30/2020 58 (L) >59 mL/min/1.73 Final   eGFR  Date Value Ref Range Status  11/08/2021 67 >59 mL/min/1.73 Final         Failed - Valid encounter within last 6 months    Recent Outpatient Visits          6 months ago Primary hypertension   [place], Vernia Buff, NP   9  months ago Essential hypertension   [place], Vernia Buff, NP   1 year ago Primary hypertension   [place], Vernia Buff, NP   1 year ago Chronic constipation   [place], Zelda W, NP   1 year ago Essential hypertension   [place], Zelda W, NP      Future Appointments            In 3 weeks [place] Donovan, PA-C [place] BP in normal range    BP Readings from Last 1 Encounters:  11/08/21 127/72

## 2022-06-06 ENCOUNTER — Other Ambulatory Visit: Payer: Self-pay

## 2022-06-06 ENCOUNTER — Ambulatory Visit: Payer: Medicare HMO | Attending: Physician Assistant | Admitting: Physician Assistant

## 2022-06-06 ENCOUNTER — Encounter: Payer: Self-pay | Admitting: Physician Assistant

## 2022-06-06 VITALS — BP 104/67 | HR 80 | Wt 173.6 lb

## 2022-06-06 DIAGNOSIS — N521 Erectile dysfunction due to diseases classified elsewhere: Secondary | ICD-10-CM

## 2022-06-06 DIAGNOSIS — I10 Essential (primary) hypertension: Secondary | ICD-10-CM | POA: Diagnosis not present

## 2022-06-06 DIAGNOSIS — E782 Mixed hyperlipidemia: Secondary | ICD-10-CM

## 2022-06-06 DIAGNOSIS — R7303 Prediabetes: Secondary | ICD-10-CM

## 2022-06-06 DIAGNOSIS — Z8673 Personal history of transient ischemic attack (TIA), and cerebral infarction without residual deficits: Secondary | ICD-10-CM | POA: Diagnosis not present

## 2022-06-06 MED ORDER — AMLODIPINE BESYLATE 10 MG PO TABS
ORAL_TABLET | Freq: Every day | ORAL | 1 refills | Status: DC
  Filled 2022-06-06: qty 90, fill #0
  Filled 2022-07-03: qty 90, 90d supply, fill #0
  Filled 2022-10-16: qty 90, 90d supply, fill #1

## 2022-06-06 MED ORDER — SPIRONOLACTONE 25 MG PO TABS
ORAL_TABLET | Freq: Every day | ORAL | 1 refills | Status: DC
  Filled 2022-06-06: qty 90, fill #0
  Filled 2022-07-03: qty 90, 90d supply, fill #0
  Filled 2022-10-16: qty 17, 17d supply, fill #1
  Filled 2022-10-16: qty 73, 73d supply, fill #1

## 2022-06-06 MED ORDER — LOSARTAN POTASSIUM 50 MG PO TABS
ORAL_TABLET | Freq: Every day | ORAL | 1 refills | Status: DC
  Filled 2022-06-06: qty 90, fill #0
  Filled 2022-07-03: qty 90, 90d supply, fill #0
  Filled 2022-10-16: qty 90, 90d supply, fill #1

## 2022-06-06 MED ORDER — ATORVASTATIN CALCIUM 20 MG PO TABS
ORAL_TABLET | Freq: Every day | ORAL | 1 refills | Status: DC
  Filled 2022-06-06: qty 90, fill #0
  Filled 2022-07-03: qty 90, 90d supply, fill #0
  Filled 2022-10-16: qty 90, 90d supply, fill #1

## 2022-06-06 MED ORDER — SILDENAFIL CITRATE 100 MG PO TABS
50.0000 mg | ORAL_TABLET | Freq: Every day | ORAL | 11 refills | Status: DC | PRN
  Filled 2022-06-06: qty 10, 30d supply, fill #0
  Filled 2022-08-03: qty 10, 30d supply, fill #1

## 2022-06-06 NOTE — Progress Notes (Signed)
Patient ID: James Riley, male   DOB: 1951-09-19, 71 y.o.   MRN: [id number]   James Riley, is a 71 y.o. male  [id number]  [id number]  DOB - 1950-12-11  Chief Complaint  Patient presents with   Medication Refill       Subjective:   James Riley is a 71 y.o. male here today for med RF.  No new issues or concerns.  No CP/SOB/HA.  Appetite is good.  Admits to poor water intake.  No edema.     No problems updated.  ALLERGIES: No Known Allergies  PAST MEDICAL HISTORY: Past Medical History:  Diagnosis Date   Hypercholesterolemia    Hypertension    Stroke [facility]) ~ 1989   "little weak on my right side since" (09/10/2016)    MEDICATIONS AT HOME: Prior to Admission medications   Medication Sig Start Date End Date Taking? Authorizing Provider  amLODipine (NORVASC) 10 MG tablet TAKE 1 TABLET (10 MG TOTAL) BY MOUTH DAILY. 06/06/22 06/06/23  Anders Simmonds, PA-C  atorvastatin (LIPITOR) 20 MG tablet TAKE 1 TABLET (20 MG TOTAL) BY MOUTH DAILY. 06/06/22 06/06/23  Anders Simmonds, PA-C  losartan (COZAAR) 50 MG tablet TAKE 1 TABLET (50 MG TOTAL) BY MOUTH DAILY. 06/06/22 06/06/23  Anders Simmonds, PA-C  sildenafil (VIAGRA) 100 MG tablet Take 0.5-1 tablets (50-100 mg total) by mouth daily as needed for erectile dysfunction. 06/06/22   Anders Simmonds, PA-C  spironolactone (ALDACTONE) 25 MG tablet TAKE 1 TABLET (25 MG TOTAL) BY MOUTH DAILY. 06/06/22 06/06/23  Anders Simmonds, PA-C    ROS: Neg HEENT Neg resp Neg cardiac Neg GI Neg GU Neg MS Neg psych Neg neuro  Objective:   Vitals:   06/06/22 1350  BP: 104/67  Pulse: 80  SpO2: 97%  Weight: 173 lb 9.6 oz (78.7 kg)   Exam General appearance : Awake, alert, not in any distress. Speech Clear. Not toxic looking HEENT: Atraumatic and Normocephalic Neck: Supple, no JVD. No cervical lymphadenopathy.  Chest: Good air entry bilaterally, CTAB.  No rales/rhonchi/wheezing CVS: S1 S2 regular, no murmurs.  Extremities: B/L Lower Ext  shows no edema, both legs are warm to touch Neurology: Awake alert, and oriented X 3, CN II-XII intact, Non focal Skin: No Rash  Data Review Lab Results  Component Value Date   HGBA1C 6.1 (H) 11/08/2021   HGBA1C 5.9 (H) 08/09/2021   HGBA1C 6.0 (H) 05/05/2021    Assessment & Plan   1. Prediabetes I have had a lengthy discussion and provided education about insulin resistance and the intake of too much sugar/refined carbohydrates.  I have advised the patient to work at a goal of eliminating sugary drinks, candy, desserts, sweets, refined sugars, processed foods, and white carbohydrates.  The patient expresses understanding. - Hemoglobin A1c  2. History of stroke - Comprehensive metabolic panel - Lipid panel  3. Mixed hyperlipidemia - Comprehensive metabolic panel - Lipid panel - atorvastatin (LIPITOR) 20 MG tablet; TAKE 1 TABLET (20 MG TOTAL) BY MOUTH DAILY.  Dispense: 90 tablet; Refill: 1  4. Essential hypertension Controlled-continue - Comprehensive metabolic panel - amLODipine (NORVASC) 10 MG tablet; TAKE 1 TABLET (10 MG TOTAL) BY MOUTH DAILY.  Dispense: 90 tablet; Refill: 1 - losartan (COZAAR) 50 MG tablet; TAKE 1 TABLET (50 MG TOTAL) BY MOUTH DAILY.  Dispense: 90 tablet; Refill: 1 - spironolactone (ALDACTONE) 25 MG tablet; TAKE 1 TABLET (25 MG TOTAL) BY MOUTH DAILY.  Dispense: 90 tablet; Refill: 1  5. Erectile dysfunction due  to diseases classified elsewhere Discussed not taking nitrates and alerting medical officials when he takes this.  Patient verbalizes understanding - sildenafil (VIAGRA) 100 MG tablet; Take 0.5-1 tablets (50-100 mg total) by mouth daily as needed for erectile dysfunction.  Dispense: 10 tablet; Refill: 11    Return in about 6 months (around 12/07/2022) for PCP for chronic conditions.  The patient was given clear instructions to go to ER or return to medical center if symptoms don't improve, worsen or new problems develop. The patient verbalized  understanding. The patient was told to call to get lab results if they haven't heard anything in the next week.      Georgian Co, PA-C [facility] [place], [place] [phone]   06/06/2022, 1:52 PM

## 2022-06-06 NOTE — Patient Instructions (Signed)
Drink 80-100 ounces water daily 

## 2022-06-07 LAB — LIPID PANEL
Chol/HDL Ratio: 3.3 ratio (ref 0.0–5.0)
Cholesterol, Total: 126 mg/dL (ref 100–199)
HDL: 38 mg/dL — ABNORMAL LOW (ref >39)
LDL Chol Calc (NIH): 68 mg/dL (ref 0–99)
Triglycerides: 111 mg/dL (ref 0–149)
VLDL Cholesterol Cal: 20 mg/dL (ref 5–40)

## 2022-06-07 LAB — COMPREHENSIVE METABOLIC PANEL
ALT: 18 IU/L (ref 0–44)
AST: 18 IU/L (ref 0–40)
Albumin/Globulin Ratio: 1.6 (ref 1.2–2.2)
Albumin: 4.9 g/dL — ABNORMAL HIGH (ref 3.7–4.7)
Alkaline Phosphatase: 98 IU/L (ref 44–121)
BUN/Creatinine Ratio: 12 (ref 10–24)
BUN: 14 mg/dL (ref 8–27)
Bilirubin Total: 0.8 mg/dL (ref 0.0–1.2)
CO2: 18 mmol/L — ABNORMAL LOW (ref 20–29)
Calcium: 9.8 mg/dL (ref 8.6–10.2)
Chloride: 105 mmol/L (ref 96–106)
Creatinine, Ser: 1.16 mg/dL (ref 0.76–1.27)
Globulin, Total: 3 g/dL (ref 1.5–4.5)
Glucose: 92 mg/dL (ref 70–99)
Potassium: 3.9 mmol/L (ref 3.5–5.2)
Sodium: 138 mmol/L (ref 134–144)
Total Protein: 7.9 g/dL (ref 6.0–8.5)
eGFR: 67 mL/min/{1.73_m2} (ref >59)

## 2022-06-07 LAB — HEMOGLOBIN A1C
Est. average glucose Bld gHb Est-mCnc: 117 mg/dL
Hgb A1c MFr Bld: 5.7 % — ABNORMAL HIGH (ref 4.8–5.6)

## 2022-06-12 ENCOUNTER — Other Ambulatory Visit: Payer: Self-pay

## 2022-07-03 ENCOUNTER — Other Ambulatory Visit: Payer: Self-pay

## 2022-08-03 ENCOUNTER — Other Ambulatory Visit: Payer: Self-pay

## 2022-08-31 ENCOUNTER — Ambulatory Visit: Payer: Self-pay

## 2022-08-31 ENCOUNTER — Ambulatory Visit: Payer: Self-pay | Admitting: *Deleted

## 2022-08-31 NOTE — Telephone Encounter (Signed)
  Chief Complaint: Back pain and dizziness Symptoms: ibid Frequency: ongoing Pertinent Negatives: Patient denies fever, SOB, chest pain Disposition: [] ED /[x] Urgent Care (no appt availability in office) / [] Appointment(In office/virtual)/ []  [place] [facility]/ [] Home Care/ [] Refused Recommended Disposition /[] [place] [facility]/ []  Follow-up with PCP Additional Notes: Called pt back. Number given was for brother Ilona Sorrel. . Brother on DPR. Obtained some information regarding pt. Was given new number for pt. Called pt. Pt has been having dizziness for a while. Pt also has complaints of back pain, Pt asked me to call Ilona Sorrel back to tell him about UC  and follow up appt. Called Bernard back - LMOMTCB.    Summary: ongoing back pain   Patient also says having dizziness.   ----- Message from Bayard Beaver sent at 08/31/2022  9:45 AM EDT -----  Patient called in with ongoing back pain. He says the med he takes isnt' helping.      Reason for Disposition  [1] MODERATE dizziness (e.g., interferes with normal activities) AND [2] has NOT been evaluated by doctor (or NP/PA) for this  (Exception: Dizziness caused by heat exposure, sudden standing, or poor fluid intake.)  Answer Assessment - Initial Assessment Questions 1. DESCRIPTION: "Describe your dizziness."     Getting ready to black out 2. LIGHTHEADED: "Do you feel lightheaded?" (e.g., somewhat faint, woozy, weak upon standing)     Rocking back back and forth 3. VERTIGO: "Do you feel like either you or the room is spinning or tilting?" (i.e. vertigo)     Unsure 4. SEVERITY: "How bad is it?"  "Do you feel like you are going to faint?" "Can you stand and walk?"   - MILD: Feels slightly dizzy, but walking normally.   - MODERATE: Feels unsteady when walking, but not falling; interferes with normal activities (e.g., school, work).   - SEVERE: Unable to walk without falling, or requires assistance to walk without falling; feels like  passing out now.      Severe 5. ONSET:  "When did the dizziness begin?"     Several months 6. AGGRAVATING FACTORS: "Does anything make it worse?" (e.g., standing, change in head position)     Standing 7. HEART RATE: "Can you tell me your heart rate?" "How many beats in 15 seconds?"  (Note: not all patients can do this)        8. CAUSE: "What do you think is causing the dizziness?"     unsure 9. RECURRENT SYMPTOM: "Have you had dizziness before?" If Yes, ask: "When was the last time?" "What happened that time?"     ongoing 10. OTHER SYMPTOMS: "Do you have any other symptoms?" (e.g., fever, chest pain, vomiting, diarrhea, bleeding)       Back pain 11. PREGNANCY: "Is there any chance you are pregnant?" "When was your last menstrual period?"       na  Protocols used: Dizziness - Lightheadedness-A-AH

## 2022-08-31 NOTE — Telephone Encounter (Signed)
Summary: ongoing back pain   Patient also says having dizziness.   ----- Message from James Riley sent at 08/31/2022  9:45 AM EDT -----  Patient called in with ongoing back pain. He says the med he takes isnt' helping.     Called Ilona Sorrel back to update him on appointment and recommendation regarding pt. LMOMTCB

## 2022-08-31 NOTE — Telephone Encounter (Signed)
Opened chart to answer question for agent.  If chronic back pain needed to be triaged.   I let her know since it was not an acute issue and since it's chronic no triage necessary. An appt. Would be fine.

## 2022-08-31 NOTE — Telephone Encounter (Signed)
  Chief Complaint: low back pain. Per patient's brother James Riley , on [place] Symptoms: low back pain persists, difficulty sleeping reports has had dizziness x couple of weeks ago  Frequency: 1 month Pertinent Negatives: Patient denies pain shooting down legs, no N/T Disposition: [] ED /[] Urgent Care (no appt availability in office) / [x] Appointment(In office/virtual)/ []  [place] [facility]/ [] Home Care/ [] Refused Recommended Disposition /[] [place] [facility]/ []  Follow-up with PCP Additional Notes:   Requesting if patient can be seen earlier than 10/23/22. Requesting medication . Tylenol not effective.    Reason for Disposition  [1] MODERATE back pain (e.g., interferes with normal activities) AND [2] present > 3 days  Answer Assessment - Initial Assessment Questions 1. ONSET: "When did the pain begin?"      Last month 2. LOCATION: "Where does it hurt?" (upper, mid or lower back)     Lower back  3. SEVERITY: "How bad is the pain?"  (e.g., Scale 1-10; mild, moderate, or severe)   - MILD (1-3): Doesn't interfere with normal activities.    - MODERATE (4-7): Interferes with normal activities or awakens from sleep.    - SEVERE (8-10): Excruciating pain, unable to do any normal activities.      Difficulty sleeping  4. PATTERN: "Is the pain constant?" (e.g., yes, no; constant, intermittent)      Constant  5. RADIATION: "Does the pain shoot into your legs or somewhere else?"     no 6. CAUSE:  "What do you think is causing the back pain?"      Not sure  7. BACK OVERUSE:  "Any recent lifting of heavy objects, strenuous work or exercise?"     No  8. MEDICINES: "What have you taken so far for the pain?" (e.g., nothing, acetaminophen, NSAIDS)     Tylenol not effective  9. NEUROLOGIC SYMPTOMS: "Do you have any weakness, numbness, or problems with bowel/bladder control?"     no 10. OTHER SYMPTOMS: "Do you have any other symptoms?" (e.g., fever, abdomen pain, burning with urination, blood in  urine)       Dizziness at times couple of weeks ago  11. PREGNANCY: "Is there any chance you are pregnant?" "When was your last menstrual period?"       na  Protocols used: Back Pain-A-AH

## 2022-09-03 NOTE — Telephone Encounter (Signed)
Please advise 

## 2022-09-04 NOTE — Telephone Encounter (Signed)
Urgent care. I don't have any openings

## 2022-09-05 NOTE — Telephone Encounter (Signed)
Pt brother lynn advised.

## 2022-09-08 LAB — GLUCOSE, POCT (MANUAL RESULT ENTRY): POC Glucose: 125 mg/dl — AB (ref 70–99)

## 2022-10-16 ENCOUNTER — Other Ambulatory Visit: Payer: Self-pay

## 2022-10-23 ENCOUNTER — Encounter: Payer: Self-pay | Admitting: Nurse Practitioner

## 2022-10-23 ENCOUNTER — Other Ambulatory Visit: Payer: Self-pay

## 2022-10-23 ENCOUNTER — Ambulatory Visit: Payer: Medicare HMO | Attending: Nurse Practitioner | Admitting: Nurse Practitioner

## 2022-10-23 VITALS — BP 116/68 | HR 63 | Ht 70.0 in | Wt 169.2 lb

## 2022-10-23 DIAGNOSIS — F17200 Nicotine dependence, unspecified, uncomplicated: Secondary | ICD-10-CM | POA: Diagnosis not present

## 2022-10-23 DIAGNOSIS — E782 Mixed hyperlipidemia: Secondary | ICD-10-CM | POA: Diagnosis not present

## 2022-10-23 DIAGNOSIS — R7303 Prediabetes: Secondary | ICD-10-CM

## 2022-10-23 DIAGNOSIS — I10 Essential (primary) hypertension: Secondary | ICD-10-CM | POA: Diagnosis not present

## 2022-10-23 DIAGNOSIS — N521 Erectile dysfunction due to diseases classified elsewhere: Secondary | ICD-10-CM

## 2022-10-23 DIAGNOSIS — K5909 Other constipation: Secondary | ICD-10-CM | POA: Diagnosis not present

## 2022-10-23 MED ORDER — SPIRONOLACTONE 25 MG PO TABS
25.0000 mg | ORAL_TABLET | Freq: Every day | ORAL | 1 refills | Status: DC
  Filled 2022-10-23: qty 90, 90d supply, fill #0
  Filled 2023-02-12: qty 90, 90d supply, fill #1

## 2022-10-23 MED ORDER — ATORVASTATIN CALCIUM 20 MG PO TABS
20.0000 mg | ORAL_TABLET | Freq: Every day | ORAL | 1 refills | Status: DC
  Filled 2022-10-23: qty 90, 90d supply, fill #0
  Filled 2023-02-12: qty 90, 90d supply, fill #1

## 2022-10-23 MED ORDER — SENNOSIDES-DOCUSATE SODIUM 8.6-50 MG PO TABS
2.0000 | ORAL_TABLET | Freq: Every day | ORAL | 3 refills | Status: DC
  Filled 2022-10-23: qty 180, 90d supply, fill #0

## 2022-10-23 MED ORDER — LOSARTAN POTASSIUM 50 MG PO TABS
50.0000 mg | ORAL_TABLET | Freq: Every day | ORAL | 1 refills | Status: DC
  Filled 2022-10-23: qty 90, 90d supply, fill #0
  Filled 2023-02-12: qty 90, 90d supply, fill #1

## 2022-10-23 MED ORDER — AMLODIPINE BESYLATE 10 MG PO TABS
10.0000 mg | ORAL_TABLET | Freq: Every day | ORAL | 1 refills | Status: DC
  Filled 2022-10-23: qty 90, 90d supply, fill #0
  Filled 2023-02-12: qty 90, 90d supply, fill #1

## 2022-10-23 MED ORDER — SILDENAFIL CITRATE 100 MG PO TABS
50.0000 mg | ORAL_TABLET | Freq: Every day | ORAL | 11 refills | Status: DC | PRN
  Filled 2022-10-23: qty 10, 30d supply, fill #0
  Filled 2023-02-12: qty 10, 30d supply, fill #1

## 2022-10-23 NOTE — Progress Notes (Signed)
Assessment & Plan:  James Riley was seen today for hypertension and constipation.  Diagnoses and all orders for this visit:  Primary hypertension -     amLODipine (NORVASC) 10 MG tablet; Take 1 tablet (10 mg total) by mouth daily. -     losartan (COZAAR) 50 MG tablet; Take 1 tablet (50 mg total) by mouth daily. -     spironolactone (ALDACTONE) 25 MG tablet; Take 1 tablet (25 mg total) by mouth daily. -     CMP14+EGFR Continue all antihypertensives as prescribed.  Reminded to bring in blood pressure log for follow  up appointment.  RECOMMENDATIONS: DASH/Mediterranean Diets are healthier choices for HTN.    Erectile dysfunction due to diseases classified elsewhere -     sildenafil (VIAGRA) 100 MG tablet; Take 0.5-1 tablets (50-100 mg total) by mouth daily as needed for erectile dysfunction.  Mixed hyperlipidemia -     atorvastatin (LIPITOR) 20 MG tablet; Take 1 tablet (20 mg total) by mouth daily. INSTRUCTIONS: Work on a low fat, heart healthy diet and participate in regular aerobic exercise program by working out at least 150 minutes per week; 5 days a week-30 minutes per day. Avoid red meat/beef/steak,  fried foods. junk foods, sodas, sugary drinks, unhealthy snacking, alcohol and smoking.  Drink at least 80 oz of water per day and monitor your carbohydrate intake daily.    Tobacco dependence -     CT CHEST LUNG CA SCREEN LOW DOSE W/O CM; Future  Prediabetes -     Hemoglobin A1c Continue blood sugar control as discussed in office today, low carbohydrate diet, and regular physical exercise as tolerated, 150 minutes per week (30 min each day, 5 days per week, or 50 min 3 days per week). Keep blood sugar logs with fasting goal of 90-130 mg/dl, post prandial (after you eat) less than 180.  For Hypoglycemia: BS <60 and Hyperglycemia BS >400; contact the clinic ASAP. Annual eye exams and foot exams are recommended.   Chronic constipation -     senna-docusate (SENOKOT-S) 8.6-50 MG tablet; Take  2 tablets by mouth daily. For constipation Increase water intake to 64-80oz of water per day.     Patient has been counseled on age-appropriate routine health concerns for screening and prevention. These are reviewed and up-to-date. Referrals have been placed accordingly. Immunizations are up-to-date or declined.    Subjective:   Chief Complaint  Patient presents with   Hypertension   Constipation   [name] 71 y.o. male presents to office today for follow up to HTN.   He has a past medical history of Hypercholesterolemia, Hypertension, and Stroke ([place]) (~ 1989).   HTN Blood pressure is well controlled with amlodipine 10 mg daily, losartan 50 mg daily and spironolactone 25 mg daily. He does not monitor his blood pressure at home.  BP Readings from Last 3 Encounters:  10/23/22 116/68  09/08/22 128/62  06/06/22 104/67     Review of Systems  Constitutional:  Negative for fever, malaise/fatigue and weight loss.  HENT: Negative.  Negative for nosebleeds.   Eyes: Negative.  Negative for blurred vision, double vision and photophobia.  Respiratory: Negative.  Negative for cough and shortness of breath.   Cardiovascular: Negative.  Negative for chest pain, palpitations and leg swelling.  Gastrointestinal: Negative.  Negative for heartburn, nausea and vomiting.  Musculoskeletal: Negative.  Negative for myalgias.  Neurological: Negative.  Negative for dizziness, focal weakness, seizures and headaches.  Psychiatric/Behavioral: Negative.  Negative for suicidal ideas.  Past Medical History:  Diagnosis Date   Hypercholesterolemia    Hypertension    Stroke [facility]) ~ 1989   "little weak on my right side since" (09/10/2016)    Past Surgical History:  Procedure Laterality Date   APPENDECTOMY     LAPAROSCOPIC APPENDECTOMY N/A 09/07/2016   Procedure: APPENDECTOMY LAPAROSCOPIC converted to open appendectomy;  Surgeon: Coralie Keens, MD;  Location: [place];  Service: General;   Laterality: N/A;    Family History  Problem Relation Age of Onset   Diabetes Neg Hx    Hypertension Neg Hx    Colon cancer Neg Hx    Pancreatic cancer Neg Hx    Esophageal cancer Neg Hx    Liver cancer Neg Hx    Stomach cancer Neg Hx     Social History Reviewed with no changes to be made today.   Outpatient Medications Prior to Visit  Medication Sig Dispense Refill   amLODipine (NORVASC) 10 MG tablet TAKE 1 TABLET (10 MG TOTAL) BY MOUTH DAILY. 90 tablet 1   atorvastatin (LIPITOR) 20 MG tablet TAKE 1 TABLET (20 MG TOTAL) BY MOUTH DAILY. 90 tablet 1   losartan (COZAAR) 50 MG tablet TAKE 1 TABLET (50 MG TOTAL) BY MOUTH DAILY. 90 tablet 1   sildenafil (VIAGRA) 100 MG tablet Take 0.5-1 tablets (50-100 mg total) by mouth daily as needed for erectile dysfunction. 10 tablet 11   spironolactone (ALDACTONE) 25 MG tablet TAKE 1 TABLET (25 MG TOTAL) BY MOUTH DAILY. 90 tablet 1   No facility-administered medications prior to visit.    No Known Allergies     Objective:    BP 116/68 (BP Location: Left Arm)   Pulse 63   Ht _0  (1.778 m)   Wt 169 lb 3.2 oz (76.7 kg)   SpO2 100%   BMI 24.28 kg/m  Wt Readings from Last 3 Encounters:  10/23/22 169 lb 3.2 oz (76.7 kg)  06/06/22 173 lb 9.6 oz (78.7 kg)  11/08/21 177 lb 4 oz (80.4 kg)    Physical Exam Vitals and nursing note reviewed.  Constitutional:      Appearance: He is well-developed.  HENT:     Head: Normocephalic and atraumatic.  Cardiovascular:     Rate and Rhythm: Normal rate and regular rhythm.     Heart sounds: Normal heart sounds. No murmur heard.    No friction rub. No gallop.  Pulmonary:     Effort: Pulmonary effort is normal. No tachypnea or respiratory distress.     Breath sounds: Normal breath sounds. No decreased breath sounds, wheezing, rhonchi or rales.  Chest:     Chest wall: No tenderness.  Abdominal:     General: Bowel sounds are normal.     Palpations: Abdomen is soft.  Musculoskeletal:         General: Normal range of motion.     Cervical back: Normal range of motion.  Skin:    General: Skin is warm and dry.  Neurological:     Mental Status: He is alert and oriented to person, place, and time.     Coordination: Coordination normal.  Psychiatric:        Behavior: Behavior normal. Behavior is cooperative.        Thought Content: Thought content normal.        Judgment: Judgment normal.          Patient has been counseled extensively about nutrition and exercise as well as the importance of adherence with medications and  regular follow-up. The patient was given clear instructions to go to ER or return to medical center if symptoms don't improve, worsen or new problems develop. The patient verbalized understanding.   Follow-up: Return in about 3 months (around 01/23/2023).   [name], FNP-BC [facility] and [place], [place]   10/23/2022, 2:47 PM

## 2022-10-24 LAB — CMP14+EGFR
ALT: 10 IU/L (ref 0–44)
AST: 16 IU/L (ref 0–40)
Albumin/Globulin Ratio: 1.5 (ref 1.2–2.2)
Albumin: 4.8 g/dL (ref 3.8–4.8)
Alkaline Phosphatase: 109 IU/L (ref 44–121)
BUN/Creatinine Ratio: 10 (ref 10–24)
BUN: 9 mg/dL (ref 8–27)
Bilirubin Total: 0.4 mg/dL (ref 0.0–1.2)
CO2: 21 mmol/L (ref 20–29)
Calcium: 9.7 mg/dL (ref 8.6–10.2)
Chloride: 108 mmol/L — ABNORMAL HIGH (ref 96–106)
Creatinine, Ser: 0.9 mg/dL (ref 0.76–1.27)
Globulin, Total: 3.1 g/dL (ref 1.5–4.5)
Glucose: 81 mg/dL (ref 70–99)
Potassium: 4.4 mmol/L (ref 3.5–5.2)
Sodium: 144 mmol/L (ref 134–144)
Total Protein: 7.9 g/dL (ref 6.0–8.5)
eGFR: 91 mL/min/{1.73_m2} (ref >59)

## 2022-10-24 LAB — HEMOGLOBIN A1C
Est. average glucose Bld gHb Est-mCnc: 114 mg/dL
Hgb A1c MFr Bld: 5.6 % (ref 4.8–5.6)

## 2022-12-07 ENCOUNTER — Ambulatory Visit: Payer: Medicare HMO | Admitting: Nurse Practitioner

## 2023-01-24 ENCOUNTER — Encounter: Payer: Self-pay | Admitting: Nurse Practitioner

## 2023-01-24 ENCOUNTER — Ambulatory Visit: Payer: Medicare HMO | Attending: Nurse Practitioner | Admitting: Nurse Practitioner

## 2023-01-24 VITALS — BP 171/78 | HR 68 | Temp 97.8°F | Wt 170.0 lb

## 2023-01-24 DIAGNOSIS — I10 Essential (primary) hypertension: Secondary | ICD-10-CM | POA: Diagnosis not present

## 2023-01-24 DIAGNOSIS — N5089 Other specified disorders of the male genital organs: Secondary | ICD-10-CM

## 2023-01-24 DIAGNOSIS — R413 Other amnesia: Secondary | ICD-10-CM | POA: Diagnosis not present

## 2023-01-24 LAB — POCT URINALYSIS DIP (CLINITEK)
Bilirubin, UA: NEGATIVE
Blood, UA: NEGATIVE
Glucose, UA: NEGATIVE mg/dL
Ketones, POC UA: NEGATIVE mg/dL
Leukocytes, UA: NEGATIVE
Nitrite, UA: NEGATIVE
POC PROTEIN,UA: NEGATIVE
Spec Grav, UA: 1.025 (ref 1.010–1.025)
Urobilinogen, UA: 0.2 E.U./dL
pH, UA: 5.5 (ref 5.0–8.0)

## 2023-01-24 NOTE — Progress Notes (Signed)
Dysuria in groin

## 2023-01-24 NOTE — Progress Notes (Signed)
Assessment & Plan:  James Riley was seen today for hypertension.  Diagnoses and all orders for this visit:  Primary hypertension Continue amlodipine, losartan and spironolactone as prescribed.  Reminded to bring in blood pressure log for follow  up appointment.  RECOMMENDATIONS: DASH/Mediterranean Diets are healthier choices for HTN.    Short-term memory loss -     Ambulatory referral to Neurology  Scrotal mass -     POCT URINALYSIS DIP (CLINITEK) -     US SCROTUM W/DOPPLER; Future    Patient has been counseled on age-appropriate routine health concerns for screening and prevention. These are reviewed and up-to-date. Referrals have been placed accordingly. Immunizations are up-to-date or declined.    Subjective:   Chief Complaint  Patient presents with   Hypertension   [name] 72 y.o. male presents to office today for HTN  He has a past medical history of Hypercholesterolemia, Hypertension, and Stroke  (~ 1989).     HTN Blood pressure is elevated today however I am concerned there may be some medication nonadherence due to his memory issues.  I will not make any changes with his medications today. BP Readings from Last 3 Encounters:  01/24/23 (!) 171/78  10/23/22 116/68  09/08/22 128/62    Memory loss Although James Riley does not believe he has any memory issues he comes into the office on numerous occasions and does not remember conversations that we have had from recent past visits. Also I have to  reiterate to him numerous times during his office visits about medication changes and despite this he continues to come into the office and cannot recall past discussions regarding his medications.  He does have a history of stroke and this may have affected his memory as well.  However I would like for him to be evaluated by neurology. Today he did not even recognize me as his PCP.   GU He has a right painful scrotal mass approximately the size of a dime. Can not  recall how long the mass has been present. Denies any other GU symptoms.   Review of Systems  Constitutional:  Negative for fever, malaise/fatigue and weight loss.  HENT: Negative.  Negative for nosebleeds.   Eyes: Negative.  Negative for blurred vision, double vision and photophobia.  Respiratory: Negative.  Negative for cough and shortness of breath.   Cardiovascular: Negative.  Negative for chest pain, palpitations and leg swelling.  Gastrointestinal: Negative.  Negative for heartburn, nausea and vomiting.  Genitourinary:        SEE HPI  Musculoskeletal: Negative.  Negative for myalgias.  Neurological: Negative.  Negative for dizziness, focal weakness, seizures and headaches.  Psychiatric/Behavioral:  Positive for memory loss. Negative for suicidal ideas.     Past Medical History:  Diagnosis Date   Hypercholesterolemia    Hypertension    Stroke [facility]) ~ 1989   "little weak on my right side since" (09/10/2016)    Past Surgical History:  Procedure Laterality Date   APPENDECTOMY     LAPAROSCOPIC APPENDECTOMY N/A 09/07/2016   Procedure: APPENDECTOMY LAPAROSCOPIC converted to open appendectomy;  Surgeon: Coralie Keens, MD;  Location: [facility];  Service: General;  Laterality: N/A;    Family History  Problem Relation Age of Onset   Diabetes Neg Hx    Hypertension Neg Hx    Colon cancer Neg Hx    Pancreatic cancer Neg Hx    Esophageal cancer Neg Hx    Liver cancer Neg Hx  Stomach cancer Neg Hx     Social History Reviewed with no changes to be made today.   Outpatient Medications Prior to Visit  Medication Sig Dispense Refill   amLODipine (NORVASC) 10 MG tablet Take 1 tablet (10 mg total) by mouth daily. 90 tablet 1   atorvastatin (LIPITOR) 20 MG tablet Take 1 tablet (20 mg total) by mouth daily. 90 tablet 1   losartan (COZAAR) 50 MG tablet Take 1 tablet (50 mg total) by mouth daily. 90 tablet 1   senna-docusate (SENOKOT-S) 8.6-50 MG tablet Take 2 tablets by mouth daily. For  constipation 180 tablet 3   sildenafil (VIAGRA) 100 MG tablet Take 0.5-1 tablets (50-100 mg total) by mouth daily as needed for erectile dysfunction. 10 tablet 11   spironolactone (ALDACTONE) 25 MG tablet Take 1 tablet (25 mg total) by mouth daily. 90 tablet 1   No facility-administered medications prior to visit.    No Known Allergies     Objective:    BP (!) 171/78   Pulse 68   Temp 97.8 F (36.6 C) (Oral)   Wt 170 lb (77.1 kg)   SpO2 100%   BMI 24.39 kg/m  Wt Readings from Last 3 Encounters:  01/24/23 170 lb (77.1 kg)  10/23/22 169 lb 3.2 oz (76.7 kg)  06/06/22 173 lb 9.6 oz (78.7 kg)    Physical Exam Vitals and nursing note reviewed.  Constitutional:      Appearance: He is well-developed.  HENT:     Head: Normocephalic and atraumatic.  Cardiovascular:     Rate and Rhythm: Normal rate and regular rhythm.     Heart sounds: Normal heart sounds. No murmur heard.    No friction rub. No gallop.  Pulmonary:     Effort: Pulmonary effort is normal. No tachypnea or respiratory distress.     Breath sounds: Normal breath sounds. No decreased breath sounds, wheezing, rhonchi or rales.  Chest:     Chest wall: No tenderness.  Abdominal:     General: Bowel sounds are normal.     Palpations: Abdomen is soft.  Genitourinary:    Testes:        Right: Mass present.    Musculoskeletal:        General: Normal range of motion.     Cervical back: Normal range of motion.  Skin:    General: Skin is warm and dry.  Neurological:     Mental Status: He is alert and oriented to person, place, and time.     Coordination: Coordination normal.  Psychiatric:        Behavior: Behavior normal. Behavior is cooperative.        Thought Content: Thought content normal.        Judgment: Judgment normal.          Patient has been counseled extensively about nutrition and exercise as well as the importance of adherence with medications and regular follow-up. The patient was given clear  instructions to go to ER or return to medical center if symptoms don't improve, worsen or new problems develop. The patient verbalized understanding.   Follow-up: Return in about 3 months (around 04/24/2023).   [name], FNP-BC [facility], [place]   02/03/2023, 12:04 PM

## 2023-01-25 ENCOUNTER — Telehealth: Payer: Self-pay | Admitting: Nurse Practitioner

## 2023-01-25 NOTE — Telephone Encounter (Signed)
Received [place] order but they need to know if this is with or without Doppler/ please advise asap

## 2023-01-25 NOTE — Progress Notes (Signed)
LYNN , who is the patient's brother is aware of scheduled appointment.

## 2023-01-25 NOTE — Progress Notes (Signed)
Scheduled

## 2023-01-25 NOTE — Telephone Encounter (Signed)
Routing to PCP for review.

## 2023-01-28 ENCOUNTER — Telehealth: Payer: Self-pay | Admitting: Nurse Practitioner

## 2023-01-28 NOTE — Telephone Encounter (Signed)
With doppler

## 2023-01-28 NOTE — Telephone Encounter (Signed)
Copied from [place] [id number]. Topic: Appointment Scheduling - Scheduling Inquiry for Clinic >> Jan 28, 2023 10:23 AM Penni Bombard wrote: Reason for CRM: Latoya with [facility] called asking if the [place] for Testicle need to have a doppler or not  CB@  [phone]

## 2023-01-29 NOTE — Telephone Encounter (Signed)
[facility] aware of response. Ms. Yvetta Coder is aware.

## 2023-01-29 NOTE — Telephone Encounter (Signed)
Already address with by provider.

## 2023-02-03 ENCOUNTER — Encounter: Payer: Self-pay | Admitting: Nurse Practitioner

## 2023-02-05 ENCOUNTER — Ambulatory Visit
Admission: RE | Admit: 2023-02-05 | Discharge: 2023-02-05 | Disposition: A | Discharge status: Home / Self Care | Payer: Medicare HMO | Source: Ambulatory Visit | Attending: Nurse Practitioner | Admitting: Nurse Practitioner

## 2023-02-05 DIAGNOSIS — R1909 Other intra-abdominal and pelvic swelling, mass and lump: Secondary | ICD-10-CM | POA: Diagnosis not present

## 2023-02-05 DIAGNOSIS — I861 Scrotal varices: Secondary | ICD-10-CM | POA: Diagnosis not present

## 2023-02-05 DIAGNOSIS — L989 Disorder of the skin and subcutaneous tissue, unspecified: Secondary | ICD-10-CM | POA: Diagnosis not present

## 2023-02-05 DIAGNOSIS — N5089 Other specified disorders of the male genital organs: Secondary | ICD-10-CM

## 2023-02-12 ENCOUNTER — Other Ambulatory Visit: Payer: Self-pay

## 2023-02-12 DIAGNOSIS — E782 Mixed hyperlipidemia: Secondary | ICD-10-CM

## 2023-02-12 DIAGNOSIS — K5909 Other constipation: Secondary | ICD-10-CM

## 2023-02-12 DIAGNOSIS — N521 Erectile dysfunction due to diseases classified elsewhere: Secondary | ICD-10-CM

## 2023-02-12 DIAGNOSIS — I10 Essential (primary) hypertension: Secondary | ICD-10-CM

## 2023-02-12 MED ORDER — AMLODIPINE BESYLATE 10 MG PO TABS
10.0000 mg | ORAL_TABLET | Freq: Every day | ORAL | 1 refills | Status: DC
  Filled 2023-02-12: qty 90, 90d supply, fill #0

## 2023-02-12 MED ORDER — SILDENAFIL CITRATE 100 MG PO TABS
50.0000 mg | ORAL_TABLET | Freq: Every day | ORAL | 11 refills | Status: DC | PRN
  Filled 2023-02-12: qty 10, 10d supply, fill #0

## 2023-02-12 MED ORDER — LOSARTAN POTASSIUM 50 MG PO TABS
50.0000 mg | ORAL_TABLET | Freq: Every day | ORAL | 1 refills | Status: DC
  Filled 2023-02-12: qty 90, 90d supply, fill #0

## 2023-02-12 MED ORDER — SENNOSIDES-DOCUSATE SODIUM 8.6-50 MG PO TABS
2.0000 | ORAL_TABLET | Freq: Every day | ORAL | 3 refills | Status: DC
  Filled 2023-02-12: qty 180, 90d supply, fill #0

## 2023-02-12 MED ORDER — ATORVASTATIN CALCIUM 20 MG PO TABS
20.0000 mg | ORAL_TABLET | Freq: Every day | ORAL | 1 refills | Status: DC
  Filled 2023-02-12: qty 90, 90d supply, fill #0

## 2023-02-12 MED ORDER — SPIRONOLACTONE 25 MG PO TABS
25.0000 mg | ORAL_TABLET | Freq: Every day | ORAL | 1 refills | Status: DC
  Filled 2023-02-12: qty 90, 90d supply, fill #0

## 2023-02-12 NOTE — Progress Notes (Unsigned)
Patient in office requesting medication refill for all med's. Medication refills agreed upon by PCP. Medication refill sent to [facility]  pharmacy. Patient is aware.

## 2023-02-13 ENCOUNTER — Telehealth: Payer: Self-pay | Admitting: Nurse Practitioner

## 2023-02-13 NOTE — Telephone Encounter (Signed)
Called patient to schedule Medicare Annual Wellness Visit (AWV). Left message for patient to call back and schedule Medicare Annual Wellness Visit (AWV).  Last date of AWV: 08/17/21  If any questions, please contact me at [phone].  Thank you ,  B[employer] AWV direct phone # [phone]

## 2023-02-22 ENCOUNTER — Ambulatory Visit
Admission: RE | Admit: 2023-02-22 | Discharge: 2023-02-22 | Disposition: A | Discharge status: Home / Self Care | Payer: Medicare HMO | Source: Ambulatory Visit | Attending: Nurse Practitioner | Admitting: Nurse Practitioner

## 2023-02-22 ENCOUNTER — Other Ambulatory Visit: Payer: Medicare HMO

## 2023-02-22 DIAGNOSIS — M47812 Spondylosis without myelopathy or radiculopathy, cervical region: Secondary | ICD-10-CM | POA: Diagnosis not present

## 2023-02-22 DIAGNOSIS — J432 Centrilobular emphysema: Secondary | ICD-10-CM | POA: Diagnosis not present

## 2023-02-22 DIAGNOSIS — F17200 Nicotine dependence, unspecified, uncomplicated: Secondary | ICD-10-CM

## 2023-02-22 DIAGNOSIS — I2510 Atherosclerotic heart disease of native coronary artery without angina pectoris: Secondary | ICD-10-CM | POA: Diagnosis not present

## 2023-02-22 DIAGNOSIS — F17210 Nicotine dependence, cigarettes, uncomplicated: Secondary | ICD-10-CM | POA: Diagnosis not present

## 2023-02-25 ENCOUNTER — Other Ambulatory Visit: Payer: Self-pay | Admitting: Nurse Practitioner

## 2023-02-25 DIAGNOSIS — N200 Calculus of kidney: Secondary | ICD-10-CM

## 2023-02-25 DIAGNOSIS — R599 Enlarged lymph nodes, unspecified: Secondary | ICD-10-CM

## 2023-02-25 DIAGNOSIS — N5089 Other specified disorders of the male genital organs: Secondary | ICD-10-CM

## 2023-03-20 ENCOUNTER — Telehealth: Payer: Self-pay

## 2023-03-20 NOTE — Progress Notes (Signed)
Patient attempted to be outreached by Jacob Goodman, PharmD Candidate on 03/20/2023 to discuss hypertension. Left voicemail for patient to return our call at their convenience at [phone].   Jacob Goodman, PharmD Candidate     Jacquie Lukes, Pharm.D. PGY-2 Ambulatory Care Pharmacy Resident   

## 2023-04-03 ENCOUNTER — Telehealth: Payer: Self-pay | Admitting: Neurology

## 2023-04-03 ENCOUNTER — Other Ambulatory Visit: Payer: Self-pay

## 2023-04-03 ENCOUNTER — Ambulatory Visit: Payer: Medicare HMO | Admitting: Neurology

## 2023-04-03 ENCOUNTER — Encounter: Payer: Self-pay | Admitting: Neurology

## 2023-04-03 VITALS — BP 138/63 | HR 71 | Ht 71.0 in | Wt 167.8 lb

## 2023-04-03 DIAGNOSIS — F0150 Vascular dementia without behavioral disturbance: Secondary | ICD-10-CM | POA: Diagnosis not present

## 2023-04-03 DIAGNOSIS — Z8673 Personal history of transient ischemic attack (TIA), and cerebral infarction without residual deficits: Secondary | ICD-10-CM | POA: Diagnosis not present

## 2023-04-03 DIAGNOSIS — R413 Other amnesia: Secondary | ICD-10-CM | POA: Diagnosis not present

## 2023-04-03 DIAGNOSIS — F0280 Dementia in other diseases classified elsewhere without behavioral disturbance: Secondary | ICD-10-CM

## 2023-04-03 DIAGNOSIS — G309 Alzheimer's disease, unspecified: Secondary | ICD-10-CM | POA: Diagnosis not present

## 2023-04-03 MED ORDER — DONEPEZIL HCL 10 MG PO TABS
10.0000 mg | ORAL_TABLET | Freq: Every day | ORAL | 3 refills | Status: DC
  Filled 2023-04-03: qty 15, 30d supply, fill #0
  Filled 2023-07-30: qty 30, 30d supply, fill #1

## 2023-04-03 NOTE — Progress Notes (Signed)
[facility] [address] [place]. [place] [address] [phone]       OFFICE CONSULT NOTE  [name] Date of Birth:  1951/01/20 Medical Record Number:  [id number]   Referring MD: Bertram Denver, NP  Reason for Referral: Memory loss  HPI: James Riley is a 72 year old African-American male seen today for initial office consultation visit for memory loss.  He is accompanied by his sister.  History is obtained from them and review of electronic medical records.  I personally reviewed pertinent available imaging films in PACS.  He has past medical history of hypertension, hyperlipidemia, remote left pontine infarct with residual mild right-sided weakness in 2011 as well as mild right frontal lacunar infarct July 2020.  He was previously seen last visit with Shanda Bumps nurse practitioner on 09/07/2020.  Patient is referred back today by patient for new concerns of memory loss.  He is unable to provide much of the history is obtained from his sister.  He is referred for evaluation for recent 2 to 3 months of noticed progressive memory loss and cognitive deterioration.  Family has noticed he is forgetful and cannot remember recent information and needs constant reminders.  He is living alone at home independently and most activities of daily living except for finances which are handled by his younger brother.  There have been no unsafe behaviors observed.  He does get frustrated and agitated intermittently.  Not violent.  He does not have delusions or hallucinations.  He is still able to dress himself use the restroom and keep his home clean.  He denies any headache, head injury, loss of consciousness, seizures, new stroke like symptoms, falls or injuries.  There are no issues with his gait and balance.  There is family history of dementia in his mother.  He has 3 older siblings who do not have dementia yet.  He has not had any recent brain imaging studies, lab work for dementia  evaluation.  MRI scan of the brain on 06/04/2019 had shown a right frontal lacunar acute infarct with multiple small chronic infarcts within corpus callosum, bilateral thalamic, left pons, bilateral cerebellum. . Prior visit 09/07/2020, James Riley returns for 36-month stroke follow-up.  He has been stable since prior visit without new or reoccurring stroke/TIA symptoms.  Remains on aspirin 81 mg daily and atorvastatin 20 mg daily for secondary stroke prevention of side effects.  Blood pressure today 157/26 continuing on spironolactone, amlodipine and losartan managed by PCP.  He does not routinely monitor at home.  Continued tobacco use without any interest in cessation.  No concerns at this time.     History provided for reference purposes only Update 01/05/2020 JM: James Riley is a 72 year old male who is being seen today for stroke follow-up.  He has been doing well from a stroke standpoint without new or reoccurring stroke/TIA symptoms.  He does continue to have residual mild RUE weakness from prior stroke but denies worsening.  He continues to live independently maintaining all ADLs and IADLs without difficulty.  Previously on aspirin 81 mg daily but apparently patient discontinued this at some point as he did not feel this was needed or he was unable to pick up prescription (confusing reasoning).  He is agreeable to restart if a new prescription is sent in (Per epic review, PCP recently sent in refill on 12/25/2019 but then was discontinued by PCP today).  Denies bleeding or bruising while on aspirin.  Continues on atorvastatin 20 mg daily for secondary  stroke prevention without side effects.  Recent lipid panel on 12/25/2019 showed LDL 86.  Blood pressure today 138/76. He does not routinely monitor blood pressure at home.  He does endorse ongoing compliance with all prescribed antihypertensives.  Ongoing tobacco use but he is unsure daily amount as he will frequently get out his cigarettes to friends and  neighbors.  He is not interested in quitting at this time despite knowing risks.  Previously on Viagra prescribed by PCP and is questioning if he can continue ongoing.  He was not able to pick up additional refills and is questioning reasoning.  No further concerns at this time.    Initial visit 09/10/2019 [name]: James Riley is a 72 year old African-American male seen today for initial office consultation visit for stroke.  History is obtained from the patient, review of electronic medical records and have personally reviewed imaging films in PACS.  He has a past medical history of hypertension, hyperlipidemia, prediabetes and remote left pontine stroke with residual mild right-sided weakness since 2011.  He was seen in the emergency room at [facility] on 06/03/2019 with symptoms beginning the day prior.  He complained of generalized weakness dizziness lightheadedness and some difficulty walking.  Due to multitude of complaints an MRI scan of the brain was obtained which showed a small area of restricted diffusion in the right posterior frontal white matter which was felt to be an acute infarct.  Patient denied any specific symptoms of slurred speech left facial weakness of left arm or body weakness.  He admitted that he must have been dehydrated as he had not been eating and drinking well and is unclear whether his increased right-sided weakness and gait difficulties were worsening of his old stroke due to dehydration.  CT angiogram of the brain and neck were obtained which showed no significant large vessel stenosis or occlusion.  A 2 mm outpouching from supraclinoid left ICA was noted which likely represented a infundibulum rather than an aneurysm.  The patient refused to get a 2D echo as he wanted to go home.  LDL cholesterol was 60 mg percent and hemoglobin A1c was 5.6.  Patient was not on any antithrombotics prior to admission and was placed on aspirin and Plavix which also apparently has not been  taking.  He states is done well his gait and walking are back to baseline.  His blood pressure seems well controlled and today it is 148/75.  Patient was counseled to quit smoking but states he has cut back a bit but is still smoking.  Is tolerating Lipitor well without muscle aches and pains.  He has no new complaints. ROS:   14 system review of systems is positive for memory loss, getting lost, disorientation, all other systems negative  PMH:  Past Medical History:  Diagnosis Date   Hypercholesterolemia    Hypertension    Stroke [facility]) ~ 1989   "little weak on my right side since" (09/10/2016)    Social History:  Social History   Socioeconomic History   Marital status: Divorced    Spouse name: Not on file   Number of children: Not on file   Years of education: Not on file   Highest education level: Not on file  Occupational History   Not on file  Tobacco Use   Smoking status: Every Day    Packs/day: 0.50    Years: 50.00    Additional pack years: 0.00    Total pack years: 25.00  Types: Cigarettes   Smokeless tobacco: Never  Vaping Use   Vaping Use: Never used  Substance and Sexual Activity   Alcohol use: Not Currently    Alcohol/week: 1.0 standard drink of alcohol    Types: 1 Cans of beer per week    Comment: a beer once a week. As of July 2020 reports no alcohol at all   Drug use: No   Sexual activity: Not Currently  Other Topics Concern   Not on file  Social History Narrative   Not on file   Social Determinants of Health   Financial Resource Strain: Low Risk  (08/17/2021)   Overall Financial Resource Strain (CARDIA)    Difficulty of Paying Living Expenses: Not hard at all  Food Insecurity: No Food Insecurity (08/17/2021)   Hunger Vital Sign    Worried About Running Out of Food in the Last Year: Never true    Ran Out of Food in the Last Year: Never true  Transportation Needs: No Transportation Needs (08/17/2021)   PRAPARE - Scientist, research (physical sciences) (Medical): No    Lack of Transportation (Non-Medical): No  Physical Activity: Inactive (08/17/2021)   Exercise Vital Sign    Days of Exercise per Week: 0 days    Minutes of Exercise per Session: 0 min  Stress: No Stress Concern Present (08/17/2021)   [employer] of Occupational Health - Occupational Stress Questionnaire    Feeling of Stress : Not at all  Social Connections: Moderately Integrated (08/17/2021)   Social Connection and Isolation Panel [NHANES]    Frequency of Communication with Friends and Family: Three times a week    Frequency of Social Gatherings with Friends and Family: Once a week    Attends Religious Services: More than 4 times per year    Active Member of [employer] or Organizations: Yes    Attends Banker Meetings: More than 4 times per year    Marital Status: Separated  Intimate Partner Violence: Not At Risk (08/17/2021)   Humiliation, Afraid, Rape, and Kick questionnaire    Fear of Current or Ex-Partner: No    Emotionally Abused: No    Physically Abused: No    Sexually Abused: No    Medications:   Current Outpatient Medications on File Prior to Visit  Medication Sig Dispense Refill   amLODipine (NORVASC) 10 MG tablet Take 1 tablet (10 mg total) by mouth daily. 90 tablet 1   atorvastatin (LIPITOR) 20 MG tablet Take 1 tablet (20 mg total) by mouth daily. 90 tablet 1   losartan (COZAAR) 50 MG tablet Take 1 tablet (50 mg total) by mouth daily. 90 tablet 1   sildenafil (VIAGRA) 100 MG tablet Take 0.5-1 tablets (50-100 mg total) by mouth daily as needed for erectile dysfunction. 10 tablet 11   spironolactone (ALDACTONE) 25 MG tablet Take 1 tablet (25 mg total) by mouth daily. 90 tablet 1   senna-docusate (SENOKOT-S) 8.6-50 MG tablet Take 2 tablets by mouth daily. For constipation (Patient not taking: Reported on 04/03/2023) 180 tablet 3   No current facility-administered medications on file prior to visit.    Allergies:  No Known  Allergies  Physical Exam General: well developed, well nourished pleasant elderly African-American male, seated, in no evident distress Head: head normocephalic and atraumatic.   Neck: supple with no carotid or supraclavicular bruits Cardiovascular: regular rate and rhythm, no murmurs Musculoskeletal: no deformity Skin:  no rash/petichiae Vascular:  Normal pulses all extremities  Neurologic Exam Mental  Status: Awake and fully alert. Oriented to place and time. Recent and remote memory poor. Attention span, concentration and fund of knowledge diminished mood and affect appropriate.  Recall 0/3.  Clock drawing 2/4.  Able to name only 7 animals which can walk on 4 legs.  Mini-Mental status exam scored 16/30.  Unable to copy intersecting pentagons. Cranial Nerves: Fundoscopic exam reveals sharp disc margins. Pupils equal, briskly reactive to light. Extraocular movements full without nystagmus. Visual fields full to confrontation. Hearing intact. Facial sensation intact.  Light nasolabial fold asymmetry.  Tongue, palate moves normally and symmetrically.  Motor: Normal bulk and tone. Normal strength in all tested extremity muscles.  Diminished fine finger movements on the right.  Orbits left or right upper extremity. Sensory.: intact to touch , pinprick , position and vibratory sensation.  Coordination: Rapid alternating movements normal in all extremities. Finger-to-nose and heel-to-shin performed accurately bilaterally. Gait and Station: Arises from chair without difficulty. Stance is normal. Gait demonstrates normal stride length and balance . Able to heel, toe and tandem walk with slight difficulty.  Reflexes: 1+ and symmetric. Toes downgoing.        04/03/2023    9:07 AM  MMSE - Mini Mental State Exam  Orientation to time 4  Orientation to Place 5  Registration 3  Attention/ Calculation 0  Recall 0  Language- name 2 objects 2  Language- repeat 0  Language- follow 3 step command 1   Language- read & follow direction 1  Write a sentence 0  Copy design 0  Total score 16      ASSESSMENT: 72 year old African-American male with progressive subacute memory loss and cognitive impairment likely due to mixed dementia with vascular and Alzheimer's.  Prior history of right frontal lacunar infarct in 2020 and remote left pontine stroke in 2011.  Vascular risk factors of hypertension, hyperlipidemia and previous strokes      PLAN: I had a long discussion with the patient and his sister regarding his memory loss and rapidly progressive dementia and discussed plan for evaluation and treatment and answered questions.  I recommend checking dementia panel labs, EEG and MRI scan of the brain.  Trial of Aricept 5 mg daily for a month to be increased if tolerated without side effects to 10 mg daily.  And also advised them to have questions about health power of attorney and medical power of attorney for the patient.  He may not be able to live alone safely and family may have to look at alternative living arrangements for him.    Continue aspirin for stroke prevention and maintain aggressive risk factor modification with strict control of hypertension with blood pressure goal below 140/90 lipids with LDL goal below 70 mg percent.He will return for follow-up in 3 months or call earlier if needed..  Greater than 50% time during this 45-minute consultation visit was spent on counseling and coordination of care about his memory loss and strokes and discussion about evaluation and treatment and answering questions. Delia Heady, MD Note: This document was prepared with digital dictation and possible smart phrase technology. Any transcriptional errors that result from this process are unintentional.

## 2023-04-03 NOTE — Telephone Encounter (Signed)
Francine Graven Berkley Harvey: [id number] exp. 04/03/23-05/03/23 sent to [facility] [phone]

## 2023-04-03 NOTE — Telephone Encounter (Signed)
Error

## 2023-04-03 NOTE — Patient Instructions (Signed)
I had a long discussion with the patient and his sister regarding his memory loss and rapidly progressive dementia and discussed plan for evaluation and treatment and answered questions.  I recommend checking dementia panel labs, EEG and MRI scan of the brain.  Trial of Aricept 5 mg daily for a month to be increased if tolerated without side effects to 10 mg daily.  And also advised them to have questions about health power of attorney and medical power of attorney for the patient.  He may not be able to live alone safely and family may have to look at alternative living arrangements for him.  He will return for follow-up in 3 months or call earlier if needed  Alzheimer's Disease Caregiver Guide Alzheimer's disease is a condition that makes a person: Forget things. Act differently. Have trouble paying attention and doing simple tasks. These things get worse with time. The tips below can help you care for the person. How to help manage lifestyle changes Tips to help with symptoms Be calm and patient. Give simple, short answers to questions. Avoid correcting the person in a negative way. Try not to take things personally, even if the person forgets your name. Do not argue with the person. This may make the person more upset. Tips to lessen frustration Make appointments and do daily tasks when the person is at his or her best. Take your time. Simple tasks may take longer. Allow plenty of time to complete tasks. Limit choices for the person. Involve the person in what you are doing. Keep things organized: Keep a daily routine. Organize medicines in a pillbox for each day of the week. Keep a calendar in a central location to remind the person of meetings or other activities. Avoid new or crowded places, if possible. Use simple words, short sentences, and a calm voice. Only give one direction at a time. Buy clothes and shoes that are easy to put on and take off. Try to change the subject if the  person becomes frustrated or angry. Tips to prevent injury  Keep floors clear. Remove rugs, magazine racks, and floor lamps. Keep hallways well-lit. Put a handrail and non-slip mat in the bathtub or shower. Put childproof locks on cabinets that have dangerous items in them. These items include medicine, alcohol, guns, toxic cleaning items, sharp tools, matches, and lighters. Put locks on doors where the person cannot see or reach them. This helps keep the person from going out of the house and getting lost. Be ready for emergencies. Keep a list of emergency phone numbers and addresses close by. Remove car keys and lock garage doors so that the person does not try to drive. Bracelets may be worn that track location and identify the person as having memory problems. This should be worn at all times for safety. Tips for the future  Discuss financial and legal planning early. People with this disease have trouble managing their money as the disease gets worse. Get help from a professional. Talk about advance directives, safety, and daily care. Take these steps: Create a living will and choose a power of attorney. This is someone who can make decisions for the person with Alzheimer's disease when he or she can no longer do so. Discuss driving safety and when to stop driving. The person's doctor can help with this. If the person lives alone, make sure he or she is safe. Some people need extra help at home. Other people need more care at a nursing home  or care center. How to recognize changes in the person's condition With this disease, memory problems and confusion slowly get worse. In time, the person may not know his or her friends and family members. The disease can also cause changes in behavior and mood, such as anxiety or anger. The person may see, hear, taste, smell, or feel things that are not real (hallucinate). These changes can come on all of a sudden. They may happen in response to  something such as: Pain. An infection. Changes in temperature or noise. Too much stimulation. Feeling lost or scared. Medicines. Where to find support Find out about services that can provide short-term care (respite care). These can allow you to take a break when you need it. Join a support group near you. These groups can help you: Learn ways to manage stress. Share experiences with others. Get emotional comfort and support. Learn about caregiving as the disease gets worse. Know what community resources are available. Where to find more information Alzheimer's Association: [employer] Contact a doctor if: The person has a fever. The person has a sudden behavior change that does not get better with calming strategies. The person is not able to take care of himself or herself at home. You are no longer able to care for the person. Get help right away if: The person has a sudden increase in confusion or new hallucinations. The person threatens you or anyone else, including himself or herself. Get help right away if you feel like your loved one may hurt himself or herself or others, or has thoughts about taking his or her own life. Go to your nearest emergency room or: Call your local emergency services (911 in the [place] Call the National Suicide Prevention Lifeline at 318-160-0983 or 988 in the [place] This is open 24 hours a day. Text the Crisis Text Line at 636-842-3673. Summary Alzheimer's disease causes a person to forget things. A person who has this condition may have trouble doing simple tasks. Take steps to keep the person from getting hurt. Plan for future care. You can find support by joining a support group near you. This information is not intended to replace advice given to you by your health care provider. Make sure you discuss any questions you have with your health care provider. Document Revised: 06/14/2021 Document Reviewed: 03/07/2020 Elsevier Patient Education  2023  [employer].

## 2023-04-05 LAB — DEMENTIA PANEL
Homocysteine: 16 umol/L (ref 0.0–19.2)
RPR Ser Ql: NONREACTIVE
TSH: 1.94 u[IU]/mL (ref 0.450–4.500)
Vitamin B-12: 600 pg/mL (ref 232–1245)

## 2023-04-05 NOTE — Progress Notes (Signed)
Kindly inform the patient that lab work for reversible causes of memory loss was all quite satisfactory

## 2023-04-07 ENCOUNTER — Emergency Department (HOSPITAL_COMMUNITY): Payer: Medicare HMO

## 2023-04-07 ENCOUNTER — Emergency Department (HOSPITAL_COMMUNITY)
Admission: EM | Admit: 2023-04-07 | Discharge: 2023-04-08 | Disposition: A | Discharge status: Home / Self Care | Payer: Medicare HMO | Attending: Emergency Medicine | Admitting: Emergency Medicine

## 2023-04-07 ENCOUNTER — Other Ambulatory Visit: Payer: Self-pay

## 2023-04-07 ENCOUNTER — Encounter (HOSPITAL_COMMUNITY): Payer: Self-pay

## 2023-04-07 DIAGNOSIS — R110 Nausea: Secondary | ICD-10-CM | POA: Diagnosis not present

## 2023-04-07 DIAGNOSIS — Z8673 Personal history of transient ischemic attack (TIA), and cerebral infarction without residual deficits: Secondary | ICD-10-CM | POA: Diagnosis not present

## 2023-04-07 DIAGNOSIS — I10 Essential (primary) hypertension: Secondary | ICD-10-CM | POA: Insufficient documentation

## 2023-04-07 DIAGNOSIS — R531 Weakness: Secondary | ICD-10-CM | POA: Insufficient documentation

## 2023-04-07 DIAGNOSIS — R410 Disorientation, unspecified: Secondary | ICD-10-CM | POA: Diagnosis not present

## 2023-04-07 DIAGNOSIS — J9811 Atelectasis: Secondary | ICD-10-CM | POA: Diagnosis not present

## 2023-04-07 DIAGNOSIS — I6381 Other cerebral infarction due to occlusion or stenosis of small artery: Secondary | ICD-10-CM | POA: Diagnosis not present

## 2023-04-07 DIAGNOSIS — R42 Dizziness and giddiness: Secondary | ICD-10-CM | POA: Diagnosis not present

## 2023-04-07 LAB — BASIC METABOLIC PANEL
Anion gap: 13 (ref 5–15)
BUN: 22 mg/dL (ref 8–23)
CO2: 21 mmol/L — ABNORMAL LOW (ref 22–32)
Calcium: 9.5 mg/dL (ref 8.9–10.3)
Chloride: 98 mmol/L (ref 98–111)
Creatinine, Ser: 1.21 mg/dL (ref 0.61–1.24)
GFR, Estimated: >60 mL/min (ref >60)
Glucose, Bld: 136 mg/dL — ABNORMAL HIGH (ref 70–99)
Potassium: 3.4 mmol/L — ABNORMAL LOW (ref 3.5–5.1)
Sodium: 132 mmol/L — ABNORMAL LOW (ref 135–145)

## 2023-04-07 LAB — CBC
HCT: 39.3 % (ref 39.0–52.0)
Hemoglobin: 14.1 g/dL (ref 13.0–17.0)
MCH: 32.9 pg (ref 26.0–34.0)
MCHC: 35.9 g/dL (ref 30.0–36.0)
MCV: 91.8 fL (ref 80.0–100.0)
Platelets: 170 10*3/uL (ref 150–400)
RBC: 4.28 MIL/uL (ref 4.22–5.81)
RDW: 12.7 % (ref 11.5–15.5)
WBC: 8.3 10*3/uL (ref 4.0–10.5)
nRBC: 0 % (ref 0.0–0.2)

## 2023-04-07 LAB — TROPONIN I (HIGH SENSITIVITY)
Troponin I (High Sensitivity): 4 ng/L (ref <18)
Troponin I (High Sensitivity): 6 ng/L (ref <18)

## 2023-04-07 NOTE — ED Triage Notes (Signed)
Pt BIB [facility] with c/o generalized weakness that has been going on for a couple of weeks. Pt endorses intermittent bilateral leg pain and nausea. Pe rpt, he has been getting "hot flashes" and intermittent dizziness. Pt a&ox4. Pt denies vision changes, focal weakness, or slurred speech.

## 2023-04-07 NOTE — ED Provider Triage Note (Signed)
Emergency Medicine Provider Triage Evaluation Note  James Riley , a 72 y.o. male  was evaluated in triage.  Pt complains of this in his legs for the last month.  He denies any fall or injury.  He states that at baseline he is able to ambulate unassisted but has had increased difficulty with walking due to his weakness.  No associated headache, chest pain, shortness of breath, vision changes, nausea, vomiting, diarrhea, fever, or other complaints.  Review of Systems  Positive: See HPI Negative: See HPI  Physical Exam  BP (!) 135/57 (BP Location: Right Arm)   Pulse 81   Temp 98.2 F (36.8 C)   Resp 16   Wt 75.8 kg   SpO2 98%   BMI 23.29 kg/m  Gen:   Awake, no distress   Resp:  Normal effort lungs clear to auscultation MSK:   Moves all extremities x 4 Other:  Alert and oriented, cranial nerves intact, 5/5 strength to bilateral upper extremities, 5/5 strength to the right lower extremity and 4/5 strength to the left lower extremity  Medical Decision Making  Medically screening exam initiated at 5:44 PM.  Appropriate orders placed.  James Riley was informed that the remainder of the evaluation will be completed by another provider, this initial triage assessment does not replace that evaluation, and the importance of remaining in the ED until their evaluation is complete.    Tonette Lederer, PA-C 04/07/23 1745

## 2023-04-08 ENCOUNTER — Telehealth: Payer: Self-pay

## 2023-04-08 LAB — URINALYSIS, ROUTINE W REFLEX MICROSCOPIC
Bilirubin Urine: NEGATIVE
Glucose, UA: NEGATIVE mg/dL
Hgb urine dipstick: NEGATIVE
Ketones, ur: NEGATIVE mg/dL
Leukocytes,Ua: NEGATIVE
Nitrite: NEGATIVE
Protein, ur: NEGATIVE mg/dL
Specific Gravity, Urine: 1.018 (ref 1.005–1.030)
pH: 5 (ref 5.0–8.0)

## 2023-04-08 NOTE — Discharge Instructions (Signed)
Your workup in the ED was reassuring.  We recommend follow-up with your primary care doctor.  You may return for any new or concerning symptoms.

## 2023-04-08 NOTE — ED Provider Notes (Signed)
[place] EMERGENCY DEPARTMENT AT [facility] Provider Note   CSN: [id number] Arrival date & time: 04/07/23  1720     History  Chief Complaint  Patient presents with   Weakness    James Riley is a 72 y.o. male.  72 y/o male with history of hypertension, hyperlipidemia, CVA presents to the emergency department for evaluation of generalized weakness.  He reports that he has been feeling generally weak for the past month.  This evening, he had difficulty getting up without assistance.  This is unusual for him.  He is usually able to ambulate unassisted at baseline.  He does have assist devices at home to use, when needed.  The patient does report hx of chronic low back pain, this has been at baseline. He denies new/worsening urinary or fecal incontinence, genital or perianal numbness, recent trauma or fall.  His sister, at bedside, reports that the patient had an episode of confusion where his speech did not appear to be making sense.  This self resolved within a matter of minutes and has not recurred.  This episode prompted the call to EMS.  Patient denies associated headache, unilateral weakness, numbness, paresthesias, facial drooping, vision loss, N/V, CP, SOB, fevers.   The history is provided by the patient and a relative. No language interpreter was used.  Weakness      Home Medications Prior to Admission medications   Medication Sig Start Date End Date Taking? Authorizing Provider  amLODipine (NORVASC) 10 MG tablet Take 1 tablet (10 mg total) by mouth daily. 04/24/23   Claiborne Rigg, NP  aspirin EC 81 MG tablet Take 1 tablet (81 mg total) by mouth daily. Swallow whole. 04/24/23   Claiborne Rigg, NP  atorvastatin (LIPITOR) 20 MG tablet Take 1 tablet (20 mg total) by mouth daily. 04/24/23   Claiborne Rigg, NP  donepezil (ARICEPT) 10 MG tablet Take 1 tablet (10 mg total) by mouth at bedtime. Start 1/2 tablet daily x 1 month and then 1 tablet daily Patient not  taking: Reported on 04/24/2023 04/03/23   James Riley, MD  losartan (COZAAR) 50 MG tablet Take 1 tablet (50 mg total) by mouth daily. 04/24/23   Claiborne Rigg, NP  senna-docusate (SENOKOT-S) 8.6-50 MG tablet Take 2 tablets by mouth daily. For constipation Patient not taking: Reported on 04/03/2023 02/12/23   Claiborne Rigg, NP  sildenafil (VIAGRA) 100 MG tablet Take 0.5-1 tablets (50-100 mg total) by mouth daily as needed for erectile dysfunction. Patient not taking: Reported on 04/24/2023 02/12/23   Claiborne Rigg, NP  spironolactone (ALDACTONE) 25 MG tablet Take 1 tablet (25 mg total) by mouth daily. 04/24/23   Claiborne Rigg, NP  Zoster Vaccine Adjuvanted [facility]) injection Inject 0.5 mLs into the muscle once for 1 dose. 04/24/23 04/25/23  Claiborne Rigg, NP      Allergies    Patient has no known allergies.    Review of Systems   Review of Systems  Neurological:  Positive for weakness.  Ten systems reviewed and are negative for acute change, except as noted in the HPI.    Physical Exam Updated Vital Signs BP 124/71   Pulse (!) 59   Temp 97.9 F (36.6 C) (Oral)   Resp 14   Wt 75.8 kg   SpO2 99%   BMI 23.29 kg/m   Physical Exam Vitals and nursing note reviewed.  Constitutional:      General: He is not in acute  distress.    Appearance: He is well-developed. He is not diaphoretic.     Comments: Nontoxic appearing and in NAD  HENT:     Head: Normocephalic and atraumatic.  Eyes:     General: No scleral icterus.    Conjunctiva/sclera: Conjunctivae normal.  Cardiovascular:     Rate and Rhythm: Normal rate and regular rhythm.     Pulses: Normal pulses.  Pulmonary:     Effort: Pulmonary effort is normal. No respiratory distress.     Comments: Respirations even and unlabored Abdominal:     General: There is no distension.     Palpations: Abdomen is soft. There is no mass.  Musculoskeletal:        General: Normal range of motion.     Cervical back: Normal range of  motion.  Skin:    General: Skin is warm and dry.     Coloration: Skin is not pale.     Findings: No erythema or rash.  Neurological:     Mental Status: He is alert and oriented to person, place, and time.     Comments: GCS 15. Speech is goal oriented. No deficits appreciated to CN III-XII; symmetric eyebrow raise, no facial drooping, tongue midline. Patient has equal grip strength bilaterally with 5/5 strength against resistance in all major muscle groups bilaterally. Sensation to light touch intact. Patient moves extremities without ataxia. Ambulatory in room w/o assistance.  Psychiatric:        Behavior: Behavior normal.     ED Results / Procedures / Treatments   Labs (all labs ordered are listed, but only abnormal results are displayed) Labs Reviewed  BASIC METABOLIC PANEL - Abnormal; Notable for the following components:      Result Value   Sodium 132 (*)    Potassium 3.4 (*)    CO2 21 (*)    Glucose, Bld 136 (*)    All other components within normal limits  CBC  URINALYSIS, ROUTINE W REFLEX MICROSCOPIC  TROPONIN I (HIGH SENSITIVITY)  TROPONIN I (HIGH SENSITIVITY)    EKG EKG Interpretation  Date/Time:  Sunday Apr 07 2023 17:42:57 EDT Ventricular Rate:  77 PR Interval:  170 QRS Duration: 100 QT Interval:  372 QTC Calculation: 420 R Axis:   61 Text Interpretation: Normal sinus rhythm Normal ECG When compared with ECG of 03-Jun-2019 20:05, No significant change was found Confirmed by Dione Booze ([id number]) on 04/08/2023 2:37:11 AM  Radiology CT Head Wo Contrast  Result Date: 04/07/2023 CLINICAL DATA:  Neuro deficit, acute, stroke suspected EXAM: CT HEAD WITHOUT CONTRAST TECHNIQUE: Contiguous axial images were obtained from the base of the skull through the vertex without intravenous contrast. RADIATION DOSE REDUCTION: This exam was performed according to the departmental dose-optimization program which includes automated exposure control, adjustment of the mA and/or kV  according to patient size and/or use of iterative reconstruction technique. COMPARISON:  06/04/2019 FINDINGS: Brain: Normal anatomic configuration. Parenchymal volume loss is commensurate with the patient's age. Mild periventricular white matter changes are present likely reflecting the sequela of small vessel ischemia. Remote lacunar infarct within the right thalamus anteriorly. No abnormal intra or extra-axial mass lesion or fluid collection. No abnormal mass effect or midline shift. No evidence of acute intracranial hemorrhage or infarct. Ventricular size is normal. Cerebellum unremarkable. Vascular: No asymmetric hyperdense vasculature at the skull base. Skull: Intact Sinuses/Orbits: Paranasal sinuses are clear. Orbits are unremarkable. Other: Mastoid air cells and middle ear cavities are clear. IMPRESSION: 1. No acute intracranial hemorrhage or infarct.  2. Remote lacunar infarct within the right thalamus. 3. Mild senescent change. Electronically Signed   By: Helyn Numbers M.D.   On: 04/07/2023 19:11   DG Chest 2 View  Result Date: 04/07/2023 CLINICAL DATA:  Weakness.  History of stroke. EXAM: CHEST - 2 VIEW COMPARISON:  Chest radiograph dated 10/12/2010. FINDINGS: Shallow inspiration. Bibasilar streaky densities, likely atelectasis. Developing infiltrate is less likely. No focal consolidation, pleural effusion, or pneumothorax. The cardiac silhouette is within normal limits. Acute osseous pathology. IMPRESSION: Shallow inspiration with bibasilar atelectasis. No focal consolidation. Electronically Signed   By: Elgie Collard M.D.   On: 04/07/2023 18:57    Procedures Procedures    Medications Ordered in ED Medications - No data to display  ED Course/ Medical Decision Making/ A&P Clinical Course as of 04/25/23 1749  Mon Apr 08, 2023  1610 Negative UA. Given reassuring labs, stable vital signs, nonfocal neurologic exam, plan for outpatient PCP f/u for recheck. [KH]    Clinical Course User  Index [KH] Antony Madura, PA-C                             Medical Decision Making Amount and/or Complexity of Data Reviewed Labs: ordered.   This patient presents to the ED for concern of generalized weakness and confusion, this involves an extensive number of treatment options, and is a complaint that carries with it a high risk of complications and morbidity.  The differential diagnosis includes CVA vs substance use/abuse vs transient hypotension vs arrhythmia vs hypertensive urgency/emergency vs infection   Co morbidities that complicate the patient evaluation  HTN HLD CVA   Additional history obtained:  Additional history obtained from sister, at bedside External records from outside source obtained and reviewed including MRI brain from 06/2019 noting acute/subacute CVA   Lab Tests:  I Ordered, and personally interpreted labs.  The pertinent results include:  sodium 132, potassium 3.4, CO2 21. Normal CBC, troponin, UA.   Imaging Studies ordered:  I ordered imaging studies including CT head and CXR  I independently visualized and interpreted imaging which showed no acute abnormalities on Xray or CT imaging I agree with the radiologist interpretation   Cardiac Monitoring:  The patient was maintained on a cardiac monitor.  I personally viewed and interpreted the cardiac monitored which showed an underlying rhythm of: NSR   Medicines ordered and prescription drug management:  I have reviewed the patients home medicines and have made adjustments as needed   Test Considered:  MRI brain - however, neurologic exam reassuring and patient presently asymptomatic.    Reevaluation:  After the interventions noted above, I reevaluated the patient and found that they have : remained stable   Social Determinants of Health:  Good social support Lives independently   Dispostion:  After consideration of the diagnostic results and the patients response to treatment, I  feel that the patent would benefit from outpatient PCP follow up for recheck. Encouraged adherence to prescribed medications. Return precautions discussed and provided. Patient discharged in stable condition with no unaddressed concerns.          Final Clinical Impression(s) / ED Diagnoses Final diagnoses:  Generalized weakness    Rx / DC Orders ED Discharge Orders     None         Antony Madura, PA-C 04/25/23 1754    Dione Booze, MD 04/25/23 2245

## 2023-04-08 NOTE — Telephone Encounter (Signed)
-----   Message from Deatra James, RN sent at 04/08/2023  9:36 AM EDT -----  ----- Message ----- From: [name], MD Sent: 04/05/2023   8:28 AM EDT To: Gna-Pod 2 Results  Kindly inform the patient that lab work for reversible causes of memory loss was all quite satisfactory.

## 2023-04-08 NOTE — Telephone Encounter (Signed)
Called and spoke with patient to inform him per [name] "inform the patient that lab work for reversible causes of memory loss was all quite satisfactory". Pt verbalized understanding. Pt had no questions at this time but was encouraged to call back if questions arise.

## 2023-04-17 ENCOUNTER — Ambulatory Visit: Payer: Medicare HMO | Admitting: Neurology

## 2023-04-17 DIAGNOSIS — R413 Other amnesia: Secondary | ICD-10-CM

## 2023-04-24 ENCOUNTER — Ambulatory Visit: Payer: Medicare HMO | Attending: Nurse Practitioner | Admitting: Nurse Practitioner

## 2023-04-24 ENCOUNTER — Encounter: Payer: Self-pay | Admitting: Nurse Practitioner

## 2023-04-24 ENCOUNTER — Other Ambulatory Visit: Payer: Self-pay

## 2023-04-24 VITALS — BP 149/69 | HR 72 | Ht 71.0 in | Wt 172.0 lb

## 2023-04-24 DIAGNOSIS — I10 Essential (primary) hypertension: Secondary | ICD-10-CM | POA: Diagnosis not present

## 2023-04-24 DIAGNOSIS — Z1211 Encounter for screening for malignant neoplasm of colon: Secondary | ICD-10-CM

## 2023-04-24 DIAGNOSIS — Z23 Encounter for immunization: Secondary | ICD-10-CM

## 2023-04-24 DIAGNOSIS — E782 Mixed hyperlipidemia: Secondary | ICD-10-CM | POA: Diagnosis not present

## 2023-04-24 MED ORDER — ATORVASTATIN CALCIUM 20 MG PO TABS
20.0000 mg | ORAL_TABLET | Freq: Every day | ORAL | 1 refills | Status: DC
Start: 2023-04-24 — End: 2023-08-28
  Filled 2023-04-24 – 2023-05-20 (×4): qty 90, 90d supply, fill #0

## 2023-04-24 MED ORDER — AMLODIPINE BESYLATE 10 MG PO TABS
10.0000 mg | ORAL_TABLET | Freq: Every day | ORAL | 1 refills | Status: DC
Start: 2023-04-24 — End: 2023-08-28
  Filled 2023-04-24 – 2023-05-20 (×4): qty 90, 90d supply, fill #0

## 2023-04-24 MED ORDER — LOSARTAN POTASSIUM 50 MG PO TABS
50.0000 mg | ORAL_TABLET | Freq: Every day | ORAL | 1 refills | Status: DC
Start: 2023-04-24 — End: 2023-08-28
  Filled 2023-04-24 – 2023-05-20 (×4): qty 90, 90d supply, fill #0

## 2023-04-24 MED ORDER — SPIRONOLACTONE 25 MG PO TABS
25.0000 mg | ORAL_TABLET | Freq: Every day | ORAL | 1 refills | Status: DC
Start: 2023-04-24 — End: 2023-08-28
  Filled 2023-04-24 – 2023-05-20 (×4): qty 90, 90d supply, fill #0

## 2023-04-24 MED ORDER — ZOSTER VAC RECOMB ADJUVANTED 50 MCG/0.5ML IM SUSR
0.5000 mL | Freq: Once | INTRAMUSCULAR | 0 refills | Status: AC
Start: 2023-04-24 — End: 2023-04-25
  Filled 2023-04-24: qty 0.5, 1d supply, fill #0

## 2023-04-24 MED ORDER — ASPIRIN 81 MG PO TBEC: 81.0000 mg | DELAYED_RELEASE_TABLET | Freq: Every day | ORAL | 3 refills | Status: DC

## 2023-04-24 NOTE — Progress Notes (Signed)
Assessment & Plan:  James Riley was seen today for hypertension.  Diagnoses and all orders for this visit:  Primary hypertension Encouraged to stop smoking Continue all antihypertensives as prescribed.  Reminded to bring in blood pressure log for follow  up appointment.  RECOMMENDATIONS: DASH/Mediterranean Diets are healthier choices for HTN.   -     amLODipine (NORVASC) 10 MG tablet; Take 1 tablet (10 mg total) by mouth daily. -     losartan (COZAAR) 50 MG tablet; Take 1 tablet (50 mg total) by mouth daily. -     spironolactone (ALDACTONE) 25 MG tablet; Take 1 tablet (25 mg total) by mouth daily.  Need for shingles vaccine -     Zoster Vaccine Adjuvanted [facility]) injection; Inject 0.5 mLs into the muscle once for 1 dose.  Need for Tdap vaccination -     Tdap vaccine greater than or equal to 7yo IM  Colon cancer screening -     Ambulatory referral to Gastroenterology  Mixed hyperlipidemia -     atorvastatin (LIPITOR) 20 MG tablet; Take 1 tablet (20 mg total) by mouth daily.    Patient has been counseled on age-appropriate routine health concerns for screening and prevention. These are reviewed and up-to-date. Referrals have been placed accordingly. Immunizations are up-to-date or declined.    Subjective:   Chief Complaint  Patient presents with   Hypertension   [name] 72 y.o. male presents to office today for f/u HTN.  He has past medical history of hypertension, hyperlipidemia, remote left pontine infarct with residual mild right-sided weakness in 2011 as well as mild right frontal lacunar infarct July 2020. He was previously taking ASA however it was discontinued during a visit with GI on 06-27-2021 with reason of patient preference. I will re add to his profile today.    He is currently being followed by Neurology for Dementia. He is very forgetful even after several gentle reminders of his care plan for today's office visit.    HTN He has been out of his  medications for 2 weeks. Was waiting for pharmacy to call him to pick them up.  Blood pressure is elevated today.  He is currently prescribed amlodipine 10 mg daily, spironolactone 25 mg daily and losartan 100 mg daily. BP Readings from Last 3 Encounters:  04/24/23 (!) 149/69  04/08/23 124/71  04/03/23 138/63       Review of Systems  Constitutional:  Negative for fever, malaise/fatigue and weight loss.  HENT: Negative.  Negative for nosebleeds.   Eyes: Negative.  Negative for blurred vision, double vision and photophobia.  Respiratory: Negative.  Negative for cough and shortness of breath.   Cardiovascular: Negative.  Negative for chest pain, palpitations and leg swelling.  Gastrointestinal: Negative.  Negative for heartburn, nausea and vomiting.  Musculoskeletal: Negative.  Negative for myalgias.  Neurological: Negative.  Negative for dizziness, focal weakness, seizures and headaches.  Psychiatric/Behavioral:  Positive for memory loss. Negative for suicidal ideas.     Past Medical History:  Diagnosis Date   Hypercholesterolemia    Hypertension    Stroke [facility]) ~ 1989   "little weak on my right side since" (09/10/2016)    Past Surgical History:  Procedure Laterality Date   APPENDECTOMY     LAPAROSCOPIC APPENDECTOMY N/A 09/07/2016   Procedure: APPENDECTOMY LAPAROSCOPIC converted to open appendectomy;  Surgeon: Abigail Miyamoto, MD;  Location: [facility] OR;  Service: General;  Laterality: N/A;    Family History  Problem Relation Age of Onset  Diabetes Neg Hx    Hypertension Neg Hx    Colon cancer Neg Hx    Pancreatic cancer Neg Hx    Esophageal cancer Neg Hx    Liver cancer Neg Hx    Stomach cancer Neg Hx     Social History Reviewed with no changes to be made today.   Outpatient Medications Prior to Visit  Medication Sig Dispense Refill   donepezil (ARICEPT) 10 MG tablet Take 1 tablet (10 mg total) by mouth at bedtime. Start 1/2 tablet daily x 1 month and then 1 tablet daily  (Patient not taking: Reported on 04/24/2023) 30 tablet 3   senna-docusate (SENOKOT-S) 8.6-50 MG tablet Take 2 tablets by mouth daily. For constipation (Patient not taking: Reported on 04/03/2023) 180 tablet 3   sildenafil (VIAGRA) 100 MG tablet Take 0.5-1 tablets (50-100 mg total) by mouth daily as needed for erectile dysfunction. (Patient not taking: Reported on 04/24/2023) 10 tablet 11   amLODipine (NORVASC) 10 MG tablet Take 1 tablet (10 mg total) by mouth daily. (Patient not taking: Reported on 04/24/2023) 90 tablet 1   atorvastatin (LIPITOR) 20 MG tablet Take 1 tablet (20 mg total) by mouth daily. (Patient not taking: Reported on 04/24/2023) 90 tablet 1   losartan (COZAAR) 50 MG tablet Take 1 tablet (50 mg total) by mouth daily. (Patient not taking: Reported on 04/24/2023) 90 tablet 1   spironolactone (ALDACTONE) 25 MG tablet Take 1 tablet (25 mg total) by mouth daily. (Patient not taking: Reported on 04/24/2023) 90 tablet 1   No facility-administered medications prior to visit.    No Known Allergies     Objective:    BP (!) 149/69 (BP Location: Left Arm, Patient Position: Sitting, Cuff Size: Normal)   Pulse 72   Ht 5\' 11"  (1.803 m)   Wt 172 lb (78 kg)   SpO2 100%   BMI 23.99 kg/m  Wt Readings from Last 3 Encounters:  04/24/23 172 lb (78 kg)  04/07/23 167 lb (75.8 kg)  04/03/23 167 lb 12.8 oz (76.1 kg)    Physical Exam Vitals and nursing note reviewed.  Constitutional:      Appearance: He is well-developed.  HENT:     Head: Normocephalic and atraumatic.  Cardiovascular:     Rate and Rhythm: Normal rate and regular rhythm.     Heart sounds: Normal heart sounds. No murmur heard.    No friction rub. No gallop.  Pulmonary:     Effort: Pulmonary effort is normal. No tachypnea or respiratory distress.     Breath sounds: Normal breath sounds. No decreased breath sounds, wheezing, rhonchi or rales.  Chest:     Chest wall: No tenderness.  Abdominal:     General: Bowel sounds are  normal.     Palpations: Abdomen is soft.  Musculoskeletal:        General: Normal range of motion.     Cervical back: Normal range of motion.  Skin:    General: Skin is warm and dry.  Neurological:     Mental Status: He is alert and oriented to person, place, and time.     Coordination: Coordination normal.  Psychiatric:        Behavior: Behavior normal. Behavior is cooperative.        Thought Content: Thought content normal.        Judgment: Judgment normal.          Patient has been counseled extensively about nutrition and exercise as well as the importance  of adherence with medications and regular follow-up. The patient was given clear instructions to go to ER or return to medical center if symptoms don't improve, worsen or new problems develop. The patient verbalized understanding.   Follow-up: Return in about 3 months (around 07/25/2023).   Claiborne Rigg, FNP-BC [facility] [place], [place] [phone]   04/24/2023, 12:09 PM

## 2023-04-29 ENCOUNTER — Other Ambulatory Visit: Payer: Self-pay

## 2023-04-29 NOTE — Progress Notes (Deleted)
Subjective:   James Riley is a 72 y.o. male who presents for Medicare Annual/Subsequent preventive examination.  Review of Systems    ***       Objective:    There were no vitals filed for this visit. There is no height or weight on file to calculate BMI.     04/07/2023    5:30 PM 08/17/2021    1:00 PM 04/05/2017    2:17 PM 02/18/2017    3:40 PM 09/10/2016    3:07 PM 09/07/2016    9:08 AM 09/07/2015    9:18 AM  Advanced Directives  Does Patient Have a Medical Advance Directive? No No No No No No No  Would patient like information on creating a medical advance directive? No - Patient declined No - Patient declined   No - patient declined information  No - patient declined information    Current Medications (verified) Outpatient Encounter Medications as of 04/30/2023  Medication Sig   amLODipine (NORVASC) 10 MG tablet Take 1 tablet (10 mg total) by mouth daily.   aspirin EC 81 MG tablet Take 1 tablet (81 mg total) by mouth daily. Swallow whole.   atorvastatin (LIPITOR) 20 MG tablet Take 1 tablet (20 mg total) by mouth daily.   donepezil (ARICEPT) 10 MG tablet Take 1 tablet (10 mg total) by mouth at bedtime. Start 1/2 tablet daily x 1 month and then 1 tablet daily (Patient not taking: Reported on 04/24/2023)   losartan (COZAAR) 50 MG tablet Take 1 tablet (50 mg total) by mouth daily.   senna-docusate (SENOKOT-S) 8.6-50 MG tablet Take 2 tablets by mouth daily. For constipation (Patient not taking: Reported on 04/03/2023)   sildenafil (VIAGRA) 100 MG tablet Take 0.5-1 tablets (50-100 mg total) by mouth daily as needed for erectile dysfunction. (Patient not taking: Reported on 04/24/2023)   spironolactone (ALDACTONE) 25 MG tablet Take 1 tablet (25 mg total) by mouth daily.   No facility-administered encounter medications on file as of 04/30/2023.    Allergies (verified) Patient has no known allergies.   History: Past Medical History:  Diagnosis Date   Hypercholesterolemia     Hypertension    Stroke [facility]) ~ 1989   "little weak on my right side since" (09/10/2016)   Past Surgical History:  Procedure Laterality Date   APPENDECTOMY     LAPAROSCOPIC APPENDECTOMY N/A 09/07/2016   Procedure: APPENDECTOMY LAPAROSCOPIC converted to open appendectomy;  Surgeon: Abigail Miyamoto, MD;  Location: [facility] OR;  Service: General;  Laterality: N/A;   Family History  Problem Relation Age of Onset   Diabetes Neg Hx    Hypertension Neg Hx    Colon cancer Neg Hx    Pancreatic cancer Neg Hx    Esophageal cancer Neg Hx    Liver cancer Neg Hx    Stomach cancer Neg Hx    Social History   Socioeconomic History   Marital status: Divorced    Spouse name: Not on file   Number of children: Not on file   Years of education: Not on file   Highest education level: Not on file  Occupational History   Not on file  Tobacco Use   Smoking status: Every Day    Packs/day: 0.50    Years: 50.00    Additional pack years: 0.00    Total pack years: 25.00    Types: Cigarettes   Smokeless tobacco: Never  Vaping Use   Vaping Use: Never used  Substance and Sexual Activity  Alcohol use: Not Currently    Alcohol/week: 1.0 standard drink of alcohol    Types: 1 Cans of beer per week    Comment: a beer once a week. As of July 2020 reports no alcohol at all   Drug use: No   Sexual activity: Not Currently  Other Topics Concern   Not on file  Social History Narrative   Not on file   Social Determinants of Health   Financial Resource Strain: Low Risk  (08/17/2021)   Overall Financial Resource Strain (CARDIA)    Difficulty of Paying Living Expenses: Not hard at all  Food Insecurity: No Food Insecurity (08/17/2021)   Hunger Vital Sign    Worried About Running Out of Food in the Last Year: Never true    Ran Out of Food in the Last Year: Never true  Transportation Needs: No Transportation Needs (08/17/2021)   PRAPARE - Administrator, Civil Service (Medical): No    Lack of  Transportation (Non-Medical): No  Physical Activity: Inactive (08/17/2021)   Exercise Vital Sign    Days of Exercise per Week: 0 days    Minutes of Exercise per Session: 0 min  Stress: No Stress Concern Present (08/17/2021)   [employer] of Occupational Health - Occupational Stress Questionnaire    Feeling of Stress : Not at all  Social Connections: Moderately Integrated (08/17/2021)   Social Connection and Isolation Panel [NHANES]    Frequency of Communication with Friends and Family: Three times a week    Frequency of Social Gatherings with Friends and Family: Once a week    Attends Religious Services: More than 4 times per year    Active Member of [employer] or Organizations: Yes    Attends Engineer, structural: More than 4 times per year    Marital Status: Separated    Tobacco Counseling Ready to quit: Not Answered Counseling given: Not Answered   Clinical Intake:                 Diabetic?No          Activities of Daily Living     No data to display          Patient Care Team: Claiborne Rigg, NP as PCP - General (Nurse Practitioner)  Indicate any recent Medical Services you may have received from other than [facility] providers in the past year (date may be approximate).     Assessment:   This is a routine wellness examination for James Riley.  Hearing/Vision screen No results found.  Dietary issues and exercise activities discussed:     Goals Addressed   None    Depression Screen    04/24/2023   10:51 AM 01/24/2023   11:30 AM 10/23/2022    2:30 PM 06/06/2022    1:51 PM 08/17/2021   12:56 PM 08/09/2021    9:04 AM 12/27/2020   11:41 AM  PHQ 2/9 Scores  PHQ - 2 Score 2 0 0 1 0 0 0  PHQ- 9 Score 5 0 0  0 0     Fall Risk    04/24/2023   10:43 AM 01/24/2023   11:30 AM 10/23/2022    2:22 PM 06/06/2022    1:51 PM 11/08/2021    9:58 AM  Fall Risk   Falls in the past year? 1 0 0 0 0  Number falls in past yr: 1 0 0 0 0  Injury with Fall? 1 0 0  0 0  Risk  for fall due to : No Fall Risks No Fall Risks  No Fall Risks No Fall Risks  Follow up Falls evaluation completed  Falls evaluation completed      FALL RISK PREVENTION PERTAINING TO THE HOME:  Any stairs in or around the home? {YES/NO:21197} If so, are there any without handrails? {YES/NO:21197} Home free of loose throw rugs in walkways, pet beds, electrical cords, etc? {YES/NO:21197} Adequate lighting in your home to reduce risk of falls? {YES/NO:21197}  ASSISTIVE DEVICES UTILIZED TO PREVENT FALLS:  Life alert? {YES/NO:21197} Use of a cane, walker or w/c? {YES/NO:21197} Grab bars in the bathroom? {YES/NO:21197} Shower chair or bench in shower? {YES/NO:21197} Elevated toilet seat or a handicapped toilet? {YES/NO:21197}  TIMED UP AND GO:  Was the test performed? No . Telephonic visit   Cognitive Function:    04/03/2023    9:07 AM  MMSE - Mini Mental State Exam  Orientation to time 4  Orientation to Place 5  Registration 3  Attention/ Calculation 0  Recall 0  Language- name 2 objects 2  Language- repeat 0  Language- follow 3 step command 1  Language- read & follow direction 1  Write a sentence 0  Copy design 0  Total score 16        08/17/2021    1:02 PM  6CIT Screen  What Year? 0 points  What month? 0 points  What time? 0 points  Count back from 20 2 points  Months in reverse 2 points  Repeat phrase 6 points  Total Score 10 points    Immunizations Immunization History  Administered Date(s) Administered   Influenza Whole 09/15/2010   Influenza, High Dose Seasonal PF 09/03/2017, 09/02/2018   Influenza,inj,Quad PF,6+ Mos 07/28/2019, 08/22/2020, 08/09/2021, 09/03/2022   Influenza-Unspecified 09/17/2017   PFIZER Comirnaty(Gray Top)Covid-19 Tri-Sucrose Vaccine 02/11/2020   PFIZER(Purple Top)SARS-COV-2 Vaccination 03/02/2020, 11/07/2020   Pneumococcal Conjugate-13 05/05/2021   Pneumococcal Polysaccharide-23 09/11/2016    TDAP status: Due, Education  has been provided regarding the importance of this vaccine. Advised may receive this vaccine at local pharmacy or Health Dept. Aware to provide a copy of the vaccination record if obtained from local pharmacy or Health Dept. Verbalized acceptance and understanding.  Pneumococcal vaccine status: Up to date  Covid-19 vaccine status: Information provided on how to obtain vaccines.   Qualifies for Shingles Vaccine? Yes   Zostavax completed No   Shingrix Completed?: No.    Education has been provided regarding the importance of this vaccine. Patient has been advised to call insurance company to determine out of pocket expense if they have not yet received this vaccine. Advised may also receive vaccine at local pharmacy or Health Dept. Verbalized acceptance and understanding.  Screening Tests Health Maintenance  Topic Date Due   COVID-19 Vaccine (4 - 2023-24 season) 08/03/2022   Medicare Annual Wellness (AWV)  08/17/2022   Zoster Vaccines- Shingrix (1 of 2) 07/25/2023 (Originally 08/05/2000)   INFLUENZA VACCINE  07/04/2023   Lung Cancer Screening  02/22/2024   Colonoscopy  02/07/2025   Pneumonia Vaccine 59+ Years old  Completed   Hepatitis C Screening  Completed   HPV VACCINES  Aged Out   DTaP/Tdap/Td  Discontinued    Health Maintenance  Health Maintenance Due  Topic Date Due   COVID-19 Vaccine (4 - 2023-24 season) 08/03/2022   Medicare Annual Wellness (AWV)  08/17/2022    Colorectal cancer screening: Type of screening: Colonoscopy. Completed 02/08/15. Repeat every 10 years  Lung Cancer Screening: (Low Dose CT Chest  recommended if Age 41-80 years, 30 pack-year currently smoking OR have quit w/in 15years.) does qualify.   Lung Cancer Screening Referral: last 02/22/23  Additional Screening:  Hepatitis C Screening: does qualify; Completed 07/28/19  Vision Screening: Recommended annual ophthalmology exams for early detection of glaucoma and other disorders of the eye. Is the patient up to  date with their annual eye exam?  {YES/NO:21197} Who is the provider or what is the name of the office in which the patient attends annual eye exams? *** If pt is not established with a provider, would they like to be referred to a provider to establish care? {YES/NO:21197}.   Dental Screening: Recommended annual dental exams for proper oral hygiene  Community Resource Referral / Chronic Care Management: CRR required this visit?  {YES/NO:21197}  CCM required this visit?  {YES/NO:21197}     Plan:     I have personally reviewed and noted the following in the patient's chart:   Medical and social history Use of alcohol, tobacco or illicit drugs  Current medications and supplements including opioid prescriptions. {Opioid Prescriptions:[phone]} Functional ability and status Nutritional status Physical activity Advanced directives List of other physicians Hospitalizations, surgeries, and ER visits in previous 12 months Vitals Screenings to include cognitive, depression, and falls Referrals and appointments  In addition, I have reviewed and discussed with patient certain preventive protocols, quality metrics, and best practice recommendations. A written personalized care plan for preventive services as well as general preventive health recommendations were provided to patient.     Durwin Nora, [place]   5/36/[birth year]40   Due to this being a virtual visit, the after visit summary with patients personalized plan was offered to patient via mail or my-chart. ***Patient declined at this time./ Patient would like to access on my-chart/ per request, patient was mailed a copy of AVS./ Patient preferred to pick up at office at next visit  Nurse Notes: ***

## 2023-04-30 ENCOUNTER — Other Ambulatory Visit: Payer: Self-pay

## 2023-04-30 ENCOUNTER — Ambulatory Visit: Payer: Medicare HMO | Attending: Nurse Practitioner

## 2023-05-01 NOTE — Progress Notes (Signed)
Kindly inform the patient that EEG study showed mild slowing of brainwave activity which may be seen in variety of conditions including old age, memory loss and other conditions.  No definite seizure activity was noted.

## 2023-05-06 ENCOUNTER — Telehealth: Payer: Self-pay

## 2023-05-06 NOTE — Telephone Encounter (Signed)
Called Patient and left a voicemail to return call for EEG reviewed results.

## 2023-05-06 NOTE — Telephone Encounter (Signed)
-----   Message from Katherine C Martin, RN sent at 05/01/2023 12:00 PM EDT -----  ----- Message ----- From: Sethi, Pramod S, MD Sent: 05/01/2023  11:57 AM EDT To: Gna-Pod 2 Results  Kindly inform the patient that EEG study showed mild slowing of brainwave activity which may be seen in variety of conditions including old age, memory loss and other conditions.  No definite seizure activity was noted.  

## 2023-05-07 ENCOUNTER — Telehealth: Payer: Self-pay

## 2023-05-07 ENCOUNTER — Other Ambulatory Visit: Payer: Self-pay

## 2023-05-07 NOTE — Telephone Encounter (Signed)
Called patient and LVM for him to call us back

## 2023-05-07 NOTE — Telephone Encounter (Signed)
-----   Message from Deatra James, RN sent at 05/01/2023 12:00 PM EDT -----  ----- Message ----- From: [name], MD Sent: 05/01/2023  11:57 AM EDT To: Gna-Pod 2 Results  Kindly inform the patient that EEG study showed mild slowing of brainwave activity which may be seen in variety of conditions including old age, memory loss and other conditions.  No definite seizure activity was noted.

## 2023-05-08 NOTE — Telephone Encounter (Signed)
3rd attempt to leave msg for the pt to call back

## 2023-05-20 ENCOUNTER — Other Ambulatory Visit: Payer: Self-pay

## 2023-05-27 ENCOUNTER — Other Ambulatory Visit: Payer: Medicare HMO

## 2023-07-25 ENCOUNTER — Other Ambulatory Visit: Payer: Self-pay

## 2023-07-25 ENCOUNTER — Emergency Department (HOSPITAL_COMMUNITY)
Admission: EM | Admit: 2023-07-25 | Discharge: 2023-07-26 | Discharge status: Against Medical Advice | Payer: Medicare HMO | Source: Home / Self Care | Attending: Emergency Medicine | Admitting: Emergency Medicine

## 2023-07-25 ENCOUNTER — Emergency Department (HOSPITAL_COMMUNITY): Payer: Medicare HMO

## 2023-07-25 ENCOUNTER — Encounter (HOSPITAL_COMMUNITY): Payer: Self-pay | Admitting: Emergency Medicine

## 2023-07-25 DIAGNOSIS — Z7982 Long term (current) use of aspirin: Secondary | ICD-10-CM | POA: Insufficient documentation

## 2023-07-25 DIAGNOSIS — K5900 Constipation, unspecified: Secondary | ICD-10-CM

## 2023-07-25 DIAGNOSIS — R1030 Lower abdominal pain, unspecified: Secondary | ICD-10-CM | POA: Diagnosis present

## 2023-07-25 DIAGNOSIS — R300 Dysuria: Secondary | ICD-10-CM | POA: Diagnosis not present

## 2023-07-25 LAB — URINALYSIS, ROUTINE W REFLEX MICROSCOPIC
Bacteria, UA: NONE SEEN
Bilirubin Urine: NEGATIVE
Glucose, UA: NEGATIVE mg/dL
Hgb urine dipstick: NEGATIVE
Ketones, ur: NEGATIVE mg/dL
Leukocytes,Ua: NEGATIVE
Nitrite: NEGATIVE
Protein, ur: NEGATIVE mg/dL
Specific Gravity, Urine: 1.02 (ref 1.005–1.030)
pH: 7 (ref 5.0–8.0)

## 2023-07-25 LAB — CBC
HCT: 38.3 % — ABNORMAL LOW (ref 39.0–52.0)
Hemoglobin: 13.3 g/dL (ref 13.0–17.0)
MCH: 32.4 pg (ref 26.0–34.0)
MCHC: 34.7 g/dL (ref 30.0–36.0)
MCV: 93.2 fL (ref 80.0–100.0)
Platelets: 191 10*3/uL (ref 150–400)
RBC: 4.11 MIL/uL — ABNORMAL LOW (ref 4.22–5.81)
RDW: 13.6 % (ref 11.5–15.5)
WBC: 7.1 10*3/uL (ref 4.0–10.5)
nRBC: 0 % (ref 0.0–0.2)

## 2023-07-25 LAB — COMPREHENSIVE METABOLIC PANEL
ALT: 11 U/L (ref 0–44)
AST: 21 U/L (ref 15–41)
Albumin: 4.1 g/dL (ref 3.5–5.0)
Alkaline Phosphatase: 81 U/L (ref 38–126)
Anion gap: 9 (ref 5–15)
BUN: 12 mg/dL (ref 8–23)
CO2: 25 mmol/L (ref 22–32)
Calcium: 9.7 mg/dL (ref 8.9–10.3)
Chloride: 106 mmol/L (ref 98–111)
Creatinine, Ser: 1.05 mg/dL (ref 0.61–1.24)
GFR, Estimated: >60 mL/min (ref >60)
Glucose, Bld: 98 mg/dL (ref 70–99)
Potassium: 4.2 mmol/L (ref 3.5–5.1)
Sodium: 140 mmol/L (ref 135–145)
Total Bilirubin: 0.9 mg/dL (ref 0.3–1.2)
Total Protein: 7.7 g/dL (ref 6.5–8.1)

## 2023-07-25 NOTE — ED Triage Notes (Signed)
Pt reports ongoing problem with constipation.  Pt reports no real bowel movement since Monday. Has tried to disimpact himself manually without relief.  Pt reports he has not abdominal pain just pressure in his rectum.

## 2023-07-26 ENCOUNTER — Emergency Department (HOSPITAL_COMMUNITY): Payer: Medicare HMO

## 2023-07-26 NOTE — ED Notes (Signed)
[facility] communications notified about pt leaving with IV access in the arm. Per communications, EMS personnel will be sent to pts home address for Welfare check.

## 2023-07-26 NOTE — ED Notes (Signed)
CT came to the bedside to take the pt to CT. Pt was no longer in the room. This RN called pts personal phone x3, no answer. Pt has no personal belongings left in the room.

## 2023-07-26 NOTE — ED Provider Notes (Signed)
[facility] EMERGENCY DEPARTMENT AT [facility] Provider Note   CSN: [id number] Arrival date & time: 07/25/23  2113     History  Chief Complaint  Patient presents with   Constipation    James Riley is a 72 y.o. male.  The history is provided by the patient and medical records.  Constipation James Riley is a 72 y.o. male who presents to the Emergency Department complaining of abdominal pain.  He reports 2 months of constipation with having to strain to have a bowel movement.  He does have associated abdominal pain while attempting to have a bowel movement.  He has been taking a bottle of an unknown stool softener/laxative from the grocery.  No fevers, nausea, vomiting.  He does have some associated dysuria.  He states he is having difficulty passing any stool or gas.  He is unsure when his last bowel movement was.  He lives at home alone.      Home Medications Prior to Admission medications   Medication Sig Start Date End Date Taking? Authorizing Provider  amLODipine (NORVASC) 10 MG tablet Take 1 tablet (10 mg total) by mouth daily. 04/24/23   Claiborne Rigg, NP  aspirin EC 81 MG tablet Take 1 tablet (81 mg total) by mouth daily. Swallow whole. 04/24/23   Claiborne Rigg, NP  atorvastatin (LIPITOR) 20 MG tablet Take 1 tablet (20 mg total) by mouth daily. 04/24/23   Claiborne Rigg, NP  donepezil (ARICEPT) 10 MG tablet Take 1 tablet (10 mg total) by mouth at bedtime. Start 1/2 tablet daily x 1 month and then 1 tablet daily Patient not taking: Reported on 04/24/2023 04/03/23   [name], MD  losartan (COZAAR) 50 MG tablet Take 1 tablet (50 mg total) by mouth daily. 04/24/23   Claiborne Rigg, NP  senna-docusate (SENOKOT-S) 8.6-50 MG tablet Take 2 tablets by mouth daily. For constipation Patient not taking: Reported on 04/03/2023 02/12/23   Claiborne Rigg, NP  sildenafil (VIAGRA) 100 MG tablet Take 0.5-1 tablets (50-100 mg total) by mouth daily as needed for  erectile dysfunction. Patient not taking: Reported on 04/24/2023 02/12/23   Claiborne Rigg, NP  spironolactone (ALDACTONE) 25 MG tablet Take 1 tablet (25 mg total) by mouth daily. 04/24/23   Claiborne Rigg, NP      Allergies    Patient has no known allergies.    Review of Systems   Review of Systems  Gastrointestinal:  Positive for constipation.  All other systems reviewed and are negative.   Physical Exam Updated Vital Signs BP 138/66   Pulse 60   Temp (!) 97.5 F (36.4 C)   Resp 18   SpO2 100%  Physical Exam Vitals and nursing note reviewed.  Constitutional:      Appearance: He is well-developed.  HENT:     Head: Normocephalic and atraumatic.  Cardiovascular:     Rate and Rhythm: Normal rate and regular rhythm.  Pulmonary:     Effort: Pulmonary effort is normal. No respiratory distress.  Abdominal:     Palpations: Abdomen is soft.     Tenderness: There is no guarding or rebound.     Comments: Lower abdominal tenderness  Musculoskeletal:        General: No tenderness.  Skin:    General: Skin is warm and dry.  Neurological:     Mental Status: He is alert and oriented to person, place, and time.  Psychiatric:  Behavior: Behavior normal.     ED Results / Procedures / Treatments   Labs (all labs ordered are listed, but only abnormal results are displayed) Labs Reviewed  CBC - Abnormal; Notable for the following components:      Result Value   RBC 4.11 (*)    HCT 38.3 (*)    All other components within normal limits  COMPREHENSIVE METABOLIC PANEL  URINALYSIS, ROUTINE W REFLEX MICROSCOPIC    EKG None  Radiology DG Abdomen 1 View  Result Date: 07/25/2023 CLINICAL DATA:  Constipation EXAM: ABDOMEN - 1 VIEW COMPARISON:  None Available. FINDINGS: Nonobstructed gas pattern with moderate to large stool in the colon. Probable phleboliths in the left pelvis. IMPRESSION: Nonobstructed gas pattern with moderate to large stool in the colon. Electronically  Signed   By: Jasmine Pang M.D.   On: 07/25/2023 22:30    Procedures Procedures    Medications Ordered in ED Medications - No data to display  ED Course/ Medical Decision Making/ A&P                                 Medical Decision Making Amount and/or Complexity of Data Reviewed Labs: ordered. Radiology: ordered.   Patient here for evaluation of abdominal pain, constipation.  He does have some mild abdominal tenderness on examination.  Plan to obtain CT abdomen pelvis as he has not had formal abdominal imaging in many years to rule out additional process contributing to his constipation and abdominal pain.  Patient eloped from the department prior to being able to complete workup.        Final Clinical Impression(s) / ED Diagnoses Final diagnoses:  Constipation, unspecified constipation type  Lower abdominal pain    Rx / DC Orders ED Discharge Orders     None         Tilden Fossa, MD 07/26/23 [phone]

## 2023-07-29 ENCOUNTER — Other Ambulatory Visit: Payer: Self-pay

## 2023-07-29 ENCOUNTER — Encounter: Payer: Self-pay | Admitting: Nurse Practitioner

## 2023-07-29 ENCOUNTER — Ambulatory Visit: Payer: Medicare HMO | Attending: Nurse Practitioner | Admitting: Nurse Practitioner

## 2023-07-29 VITALS — BP 144/74 | HR 69 | Ht 71.0 in | Wt 163.4 lb

## 2023-07-29 DIAGNOSIS — I10 Essential (primary) hypertension: Secondary | ICD-10-CM | POA: Diagnosis not present

## 2023-07-29 DIAGNOSIS — F03B0 Unspecified dementia, moderate, without behavioral disturbance, psychotic disturbance, mood disturbance, and anxiety: Secondary | ICD-10-CM | POA: Diagnosis not present

## 2023-07-29 DIAGNOSIS — Z23 Encounter for immunization: Secondary | ICD-10-CM | POA: Diagnosis not present

## 2023-07-29 NOTE — Progress Notes (Signed)
Assessment & Plan:  [name] was seen today for medical management of chronic issues.  Diagnoses and all orders for this visit:  Primary hypertension -     Ambulatory referral to Home Health Needs nurse visit for pill count. Medication management. May need pill packing as well. We may have to send to another pharmacy .  Need for influenza vaccination -     Flu vaccine trivalent PF, 6mos and older(Flulaval,Afluria,Fluarix,Fluzone)  Moderate dementia without behavioral disturbance, psychotic disturbance, mood disturbance, or anxiety, unspecified dementia type (HCC) -     Ambulatory referral to Home Health    Patient has been counseled on age-appropriate routine health concerns for screening and prevention. These are reviewed and up-to-date. Referrals have been placed accordingly. Immunizations are up-to-date or declined.    Subjective:   Chief Complaint  Patient presents with   Medical Management of Chronic Issues   [name] 72 y.o. male presents to office today  He has past medical history of hypertension, hyperlipidemia, remote left pontine infarct with residual mild right-sided weakness in 2011 as well as mild right frontal lacunar infarct July 2020.     He is currently being followed by Neurology for Dementia. He is very forgetful even after several gentle reminders of his care plan for today's office visit. Has not been taking his medications and unfortunately he lives by himself and today states his brother and sister are out of town.   HTN He has not taken his medications in 3 weeks. States he is not sure which ones he should be taking so has taken his medications down to the pharmacy to help sort them out.  Blood pressure is elevated today.  He is currently prescribed amlodipine 10 mg daily, spironolactone 25 mg daily and losartan 100 mg daily.  BP Readings from Last 3 Encounters:  07/29/23 (!) 144/74  07/26/23 (!) 156/71  04/24/23 (!) 149/69      Review of  Systems  Constitutional:  Negative for fever, malaise/fatigue and weight loss.  HENT: Negative.  Negative for nosebleeds.   Eyes: Negative.  Negative for blurred vision, double vision and photophobia.  Respiratory: Negative.  Negative for cough and shortness of breath.   Cardiovascular: Negative.  Negative for chest pain, palpitations and leg swelling.  Gastrointestinal: Negative.  Negative for heartburn, nausea and vomiting.  Musculoskeletal: Negative.  Negative for myalgias.  Neurological: Negative.  Negative for dizziness, focal weakness, seizures and headaches.  Psychiatric/Behavioral:  Positive for memory loss. Negative for suicidal ideas.     Past Medical History:  Diagnosis Date   Hypercholesterolemia    Hypertension    Stroke [facility] [address]) ~ 1989   "little weak on my right side since" (09/10/2016)    Past Surgical History:  Procedure Laterality Date   APPENDECTOMY     LAPAROSCOPIC APPENDECTOMY N/A 09/07/2016   Procedure: APPENDECTOMY LAPAROSCOPIC converted to open appendectomy;  Surgeon: Abigail Miyamoto, MD;  Location: [facility] OR;  Service: General;  Laterality: N/A;    Family History  Problem Relation Age of Onset   Diabetes Neg Hx    Hypertension Neg Hx    Colon cancer Neg Hx    Pancreatic cancer Neg Hx    Esophageal cancer Neg Hx    Liver cancer Neg Hx    Stomach cancer Neg Hx     Social History Reviewed with no changes to be made today.   Outpatient Medications Prior to Visit  Medication Sig Dispense Refill   amLODipine (NORVASC) 10 MG tablet  Take 1 tablet (10 mg total) by mouth daily. (Patient not taking: Reported on 07/29/2023) 90 tablet 1   aspirin EC 81 MG tablet Take 1 tablet (81 mg total) by mouth daily. Swallow whole. (Patient not taking: Reported on 07/29/2023) 90 tablet 3   atorvastatin (LIPITOR) 20 MG tablet Take 1 tablet (20 mg total) by mouth daily. (Patient not taking: Reported on 07/29/2023) 90 tablet 1   donepezil (ARICEPT) 10 MG tablet Take 1 tablet (10 mg  total) by mouth at bedtime. Start 1/2 tablet daily x 1 month and then 1 tablet daily (Patient not taking: Reported on 07/29/2023) 30 tablet 3   losartan (COZAAR) 50 MG tablet Take 1 tablet (50 mg total) by mouth daily. (Patient not taking: Reported on 07/29/2023) 90 tablet 1   senna-docusate (SENOKOT-S) 8.6-50 MG tablet Take 2 tablets by mouth daily. For constipation (Patient not taking: Reported on 07/29/2023) 180 tablet 3   sildenafil (VIAGRA) 100 MG tablet Take 0.5-1 tablets (50-100 mg total) by mouth daily as needed for erectile dysfunction. (Patient not taking: Reported on 07/29/2023) 10 tablet 11   spironolactone (ALDACTONE) 25 MG tablet Take 1 tablet (25 mg total) by mouth daily. (Patient not taking: Reported on 07/29/2023) 90 tablet 1   No facility-administered medications prior to visit.    No Known Allergies     Objective:    BP (!) 144/74 (BP Location: Left Arm, Patient Position: Sitting, Cuff Size: Normal)   Pulse 69   Ht 5\' 11"  (1.803 m)   Wt 163 lb 6.4 oz (74.1 kg)   SpO2 100%   BMI 22.79 kg/m  Wt Readings from Last 3 Encounters:  07/29/23 163 lb 6.4 oz (74.1 kg)  04/24/23 172 lb (78 kg)  04/07/23 167 lb (75.8 kg)    Physical Exam Vitals and nursing note reviewed.  Constitutional:      Appearance: He is well-developed.  HENT:     Head: Normocephalic and atraumatic.  Cardiovascular:     Rate and Rhythm: Normal rate and regular rhythm.     Heart sounds: Normal heart sounds. No murmur heard.    No friction rub. No gallop.  Pulmonary:     Effort: Pulmonary effort is normal. No tachypnea or respiratory distress.     Breath sounds: Normal breath sounds. No decreased breath sounds, wheezing, rhonchi or rales.  Chest:     Chest wall: No tenderness.  Abdominal:     General: Bowel sounds are normal.     Palpations: Abdomen is soft.  Musculoskeletal:        General: Normal range of motion.     Cervical back: Normal range of motion.  Skin:    General: Skin is warm and  dry.  Neurological:     Mental Status: He is alert and oriented to person, place, and time.     Coordination: Coordination normal.  Psychiatric:        Speech: Speech normal.        Behavior: Behavior normal. Behavior is cooperative.        Thought Content: Thought content normal.        Cognition and Memory: Memory is impaired. He exhibits impaired recent memory and impaired remote memory.          Patient has been counseled extensively about nutrition and exercise as well as the importance of adherence with medications and regular follow-up. The patient was given clear instructions to go to ER or return to medical center if symptoms don't improve,  worsen or new problems develop. The patient verbalized understanding.   Follow-up: Return in about 3 months (around 10/29/2023).   Claiborne Rigg, FNP-BC [facility] and [facility] [place], [place] [phone]   07/29/2023, 10:31 AM

## 2023-07-30 ENCOUNTER — Other Ambulatory Visit: Payer: Self-pay

## 2023-07-31 ENCOUNTER — Telehealth: Payer: Self-pay

## 2023-07-31 NOTE — Telephone Encounter (Signed)
Call placed to patient unable to reach message left on VM.   

## 2023-08-07 ENCOUNTER — Telehealth: Payer: Self-pay

## 2023-08-07 NOTE — Telephone Encounter (Signed)
Transition Care Management Unsuccessful Follow-up Telephone Call  Date of discharge and from where:  [facility]  Attempts:  1st Attempt  Reason for unsuccessful TCM follow-up call:  No answer/busy   Lenard Forth [place]  [facility], [facility] Guide, Phone: [phone] Website: [email or web address]

## 2023-08-08 ENCOUNTER — Telehealth: Payer: Self-pay

## 2023-08-08 NOTE — Telephone Encounter (Signed)
Transition Care Management Unsuccessful Follow-up Telephone Call  Date of discharge and from where:  [facility]  Attempts:  2nd Attempt  Reason for unsuccessful TCM follow-up call:  No answer/busy   Lenard Forth [place]  [facility], [employer] Guide, Phone: [phone] Website: [email or web address]

## 2023-08-14 ENCOUNTER — Telehealth: Payer: Self-pay

## 2023-08-14 NOTE — Telephone Encounter (Signed)
Patient accepted at [facility] for [facility] services. Per Audrea Muscat. Account Executive with [facility] .

## 2023-08-17 DIAGNOSIS — H9191 Unspecified hearing loss, right ear: Secondary | ICD-10-CM | POA: Diagnosis not present

## 2023-08-17 DIAGNOSIS — K5790 Diverticulosis of intestine, part unspecified, without perforation or abscess without bleeding: Secondary | ICD-10-CM | POA: Diagnosis not present

## 2023-08-17 DIAGNOSIS — I69351 Hemiplegia and hemiparesis following cerebral infarction affecting right dominant side: Secondary | ICD-10-CM | POA: Diagnosis not present

## 2023-08-17 DIAGNOSIS — I10 Essential (primary) hypertension: Secondary | ICD-10-CM | POA: Diagnosis not present

## 2023-08-17 DIAGNOSIS — F03B0 Unspecified dementia, moderate, without behavioral disturbance, psychotic disturbance, mood disturbance, and anxiety: Secondary | ICD-10-CM | POA: Diagnosis not present

## 2023-08-17 DIAGNOSIS — E7800 Pure hypercholesterolemia, unspecified: Secondary | ICD-10-CM | POA: Diagnosis not present

## 2023-08-17 DIAGNOSIS — K5900 Constipation, unspecified: Secondary | ICD-10-CM | POA: Diagnosis not present

## 2023-08-17 DIAGNOSIS — G4700 Insomnia, unspecified: Secondary | ICD-10-CM | POA: Diagnosis not present

## 2023-08-17 DIAGNOSIS — Z556 Problems related to health literacy: Secondary | ICD-10-CM | POA: Diagnosis not present

## 2023-08-22 ENCOUNTER — Ambulatory Visit: Payer: Self-pay

## 2023-08-22 DIAGNOSIS — G4700 Insomnia, unspecified: Secondary | ICD-10-CM | POA: Diagnosis not present

## 2023-08-22 DIAGNOSIS — I10 Essential (primary) hypertension: Secondary | ICD-10-CM | POA: Diagnosis not present

## 2023-08-22 DIAGNOSIS — H9191 Unspecified hearing loss, right ear: Secondary | ICD-10-CM | POA: Diagnosis not present

## 2023-08-22 DIAGNOSIS — K5900 Constipation, unspecified: Secondary | ICD-10-CM | POA: Diagnosis not present

## 2023-08-22 DIAGNOSIS — F03B0 Unspecified dementia, moderate, without behavioral disturbance, psychotic disturbance, mood disturbance, and anxiety: Secondary | ICD-10-CM | POA: Diagnosis not present

## 2023-08-22 DIAGNOSIS — K5790 Diverticulosis of intestine, part unspecified, without perforation or abscess without bleeding: Secondary | ICD-10-CM | POA: Diagnosis not present

## 2023-08-22 DIAGNOSIS — I69351 Hemiplegia and hemiparesis following cerebral infarction affecting right dominant side: Secondary | ICD-10-CM | POA: Diagnosis not present

## 2023-08-22 DIAGNOSIS — E7800 Pure hypercholesterolemia, unspecified: Secondary | ICD-10-CM | POA: Diagnosis not present

## 2023-08-22 DIAGNOSIS — Z556 Problems related to health literacy: Secondary | ICD-10-CM | POA: Diagnosis not present

## 2023-08-22 NOTE — Telephone Encounter (Signed)
Patient scheduled with PCP Re: med side effects

## 2023-08-22 NOTE — Telephone Encounter (Signed)
    Chief Complaint: Dois Davenport with [facility] reports daughter states Aricept is causing hallucinations, mainly at night. Wants to stop medication. Symptoms: Above, Dois Davenport states pt. Does not answer "some questions appropriately." Frequency: Dois Davenport unsure when this started. Pertinent Negatives: Patient denies  Disposition: [] ED /[] Urgent Care (no appt availability in office) / [] Appointment(In office/virtual)/ []  [facility]/ [] Home Care/ [] Refused Recommended Disposition /[] [facility]/ []  Follow-up with PCP Additional Notes: Please advise daughter.  Reason for Disposition  [1] Longstanding confusion (e.g., dementia, stroke) AND [2] NO worsening or change  Answer Assessment - Initial Assessment Questions 1. LEVEL OF CONSCIOUSNESS: "How is he (she, the patient) acting right now?" (e.g., alert-oriented, confused, lethargic, stuporous, comatose)     Having hallucinations at night 2. ONSET: "When did the confusion start?"  (minutes, hours, days)     Unsure 3. PATTERN "Does this come and go, or has it been constant since it started?"  "Is it present now?"     Constant 4. ALCOHOL or DRUGS: "Has he been drinking alcohol or taking any drugs?"      No 5. NARCOTIC MEDICINES: "Has he been receiving any narcotic medications?" (e.g., morphine, Vicodin)     No 6. CAUSE: "What do you think is causing the confusion?"      Daughter reports it is from Aricept. 7. OTHER SYMPTOMS: "Are there any other symptoms?" (e.g., difficulty breathing, headache, fever, weakness)     Calling daughter at night hallucinating per Home health nurse, no further information given. Daughter wants to stop medication.  Protocols used: Confusion - Delirium-A-AH

## 2023-08-28 ENCOUNTER — Encounter: Payer: Self-pay | Admitting: Nurse Practitioner

## 2023-08-28 ENCOUNTER — Ambulatory Visit: Payer: Medicare HMO | Attending: Nurse Practitioner | Admitting: Nurse Practitioner

## 2023-08-28 ENCOUNTER — Ambulatory Visit
Admission: RE | Admit: 2023-08-28 | Discharge: 2023-08-28 | Disposition: A | Discharge status: Home / Self Care | Payer: Medicare HMO | Source: Ambulatory Visit | Attending: Nurse Practitioner | Admitting: Nurse Practitioner

## 2023-08-28 ENCOUNTER — Other Ambulatory Visit: Payer: Self-pay

## 2023-08-28 VITALS — BP 119/73 | HR 70 | Ht 71.0 in | Wt 160.0 lb

## 2023-08-28 DIAGNOSIS — M5416 Radiculopathy, lumbar region: Secondary | ICD-10-CM | POA: Diagnosis not present

## 2023-08-28 DIAGNOSIS — M545 Low back pain, unspecified: Secondary | ICD-10-CM | POA: Diagnosis not present

## 2023-08-28 DIAGNOSIS — F03B0 Unspecified dementia, moderate, without behavioral disturbance, psychotic disturbance, mood disturbance, and anxiety: Secondary | ICD-10-CM

## 2023-08-28 DIAGNOSIS — I10 Essential (primary) hypertension: Secondary | ICD-10-CM | POA: Diagnosis not present

## 2023-08-28 DIAGNOSIS — G8929 Other chronic pain: Secondary | ICD-10-CM | POA: Diagnosis not present

## 2023-08-28 DIAGNOSIS — K5909 Other constipation: Secondary | ICD-10-CM

## 2023-08-28 DIAGNOSIS — E782 Mixed hyperlipidemia: Secondary | ICD-10-CM | POA: Diagnosis not present

## 2023-08-28 MED ORDER — SPIRONOLACTONE 25 MG PO TABS
25.0000 mg | ORAL_TABLET | Freq: Every day | ORAL | 1 refills | Status: DC
Start: 2023-08-28 — End: 2024-03-03
  Filled 2023-08-28: qty 90, 90d supply, fill #0
  Filled 2023-11-21: qty 90, 90d supply, fill #1

## 2023-08-28 MED ORDER — LIDOCAINE 5 % EX PTCH
1.0000 | MEDICATED_PATCH | CUTANEOUS | 1 refills | Status: DC
  Filled 2023-08-28: qty 30, 30d supply, fill #0

## 2023-08-28 MED ORDER — DONEPEZIL HCL 10 MG PO TABS
10.0000 mg | ORAL_TABLET | Freq: Every day | ORAL | 0 refills | Status: DC
Start: 2023-08-28 — End: 2023-11-21
  Filled 2023-08-28: qty 90, 90d supply, fill #0

## 2023-08-28 MED ORDER — ATORVASTATIN CALCIUM 20 MG PO TABS
20.0000 mg | ORAL_TABLET | Freq: Every day | ORAL | 1 refills | Status: DC
Start: 2023-08-28 — End: 2024-03-03
  Filled 2023-08-28: qty 90, 90d supply, fill #0
  Filled 2023-11-21: qty 90, 90d supply, fill #1

## 2023-08-28 MED ORDER — AMLODIPINE BESYLATE 10 MG PO TABS
10.0000 mg | ORAL_TABLET | Freq: Every day | ORAL | 1 refills | Status: DC
Start: 2023-08-28 — End: 2024-03-03
  Filled 2023-08-28: qty 90, 90d supply, fill #0
  Filled 2023-11-21: qty 90, 90d supply, fill #1

## 2023-08-28 MED ORDER — ASPIRIN 81 MG PO TBEC: 81.0000 mg | DELAYED_RELEASE_TABLET | Freq: Every day | ORAL | Status: AC

## 2023-08-28 MED ORDER — SENNOSIDES-DOCUSATE SODIUM 8.6-50 MG PO TABS
2.0000 | ORAL_TABLET | Freq: Every day | ORAL | 3 refills | Status: DC
Start: 2023-08-28 — End: 2024-10-06
  Filled 2023-08-28: qty 200, 100d supply, fill #0
  Filled 2023-12-01 – 2023-12-18 (×3): qty 200, 100d supply, fill #1
  Filled 2024-04-20: qty 200, 100d supply, fill #2
  Filled 2024-05-05: qty 100, 50d supply, fill #2
  Filled 2024-07-03: qty 100, 50d supply, fill #3
  Filled 2024-08-17: qty 100, 50d supply, fill #4

## 2023-08-28 MED ORDER — LOSARTAN POTASSIUM 50 MG PO TABS
50.0000 mg | ORAL_TABLET | Freq: Every day | ORAL | 1 refills | Status: DC
Start: 2023-08-28 — End: 2024-03-03
  Filled 2023-08-28: qty 90, 90d supply, fill #0
  Filled 2023-11-21: qty 90, 90d supply, fill #1

## 2023-08-28 NOTE — Progress Notes (Signed)
Assessment & Plan:  James Riley was seen today for back pain.  Diagnoses and all orders for this visit:  Lumbar radiculopathy -     DG Lumbar Spine Complete; Future  Primary hypertension -     amLODipine (NORVASC) 10 MG tablet; Take 1 tablet (10 mg total) by mouth daily. -     losartan (COZAAR) 50 MG tablet; Take 1 tablet (50 mg total) by mouth daily. -     spironolactone (ALDACTONE) 25 MG tablet; Take 1 tablet (25 mg total) by mouth daily. Continue all antihypertensives as prescribed.  Reminded to bring in blood pressure log for follow  up appointment.  RECOMMENDATIONS: DASH/Mediterranean Diets are healthier choices for HTN.    Mixed hyperlipidemia -     atorvastatin (LIPITOR) 20 MG tablet; Take 1 tablet (20 mg total) by mouth daily. LDL at goal  Lab Results  Component Value Date   LDLCALC 68 06/06/2022     Chronic constipation -     senna-docusate (SENOKOT-S) 8.6-50 MG tablet; Take 2 tablets by mouth daily. For constipation  Moderate dementia without behavioral disturbance, psychotic disturbance, mood disturbance, or anxiety, unspecified dementia type (HCC) -     donepezil (ARICEPT) 10 MG tablet; Take 1 tablet (10 mg total) by mouth at bedtime.    Patient has been counseled on age-appropriate routine health concerns for screening and prevention. These are reviewed and up-to-date. Referrals have been placed accordingly. Immunizations are up-to-date or declined.    Subjective:   Chief Complaint  Patient presents with   Back Pain    Lower back pain     [name] 72 y.o. male presents to office today for back pain. He is accompanied by his brother today.   He has a history of HTN, HPL and dementia (mother passed with alzheimer's)  Back Pain: Patient presents for presents evaluation of low back problems.  Symptoms have been present for several years and include pain and stiffness in lower back. Initial inciting event: none. Symptoms are worst: all day. Alleviating  factors identifiable by patient are none. Exacerbating factors identifiable by patient are bending backwards, bending forwards, bending sideways, recumbency, sitting, standing, and walking. Treatments so far initiated by patient:  tylenol  Previous lower back problems: none. Previous workup:  xray .  DG LUMBAR SPINE Normal alignment of the lumbar spine.  Very mild L3-4 disc space narrowing with osteophyte.  Minimal L4-5 disc space narrowing with osteophyte.  L5-S1 mild bilateral facet joint degenerative changes.  No pars defect noted.   HTN Blood pressure is well controlled. He is prescribed amlodipine 10 mg daily, spironolactone and losartan 50 mg daily.  His brother prepares his medications for him and reports sometimes there are still pills left in the pillbox the next day. He does have a nurse that comes out weekly to the home.  BP Readings from Last 3 Encounters:  08/28/23 119/73  07/29/23 (!) 144/74  07/26/23 (!) 156/71    Review of Systems  Constitutional:  Negative for fever, malaise/fatigue and weight loss.  HENT: Negative.  Negative for nosebleeds.   Eyes: Negative.  Negative for blurred vision, double vision and photophobia.  Respiratory: Negative.  Negative for cough and shortness of breath.   Cardiovascular: Negative.  Negative for chest pain, palpitations and leg swelling.  Gastrointestinal: Negative.  Negative for heartburn, nausea and vomiting.  Musculoskeletal:  Positive for back pain and myalgias.  Neurological: Negative.  Negative for dizziness, focal weakness, seizures and headaches.  Psychiatric/Behavioral: Negative.  Negative for suicidal ideas.     Past Medical History:  Diagnosis Date   Hypercholesterolemia    Hypertension    Stroke [facility]) ~ 1989   "little weak on my right side since" (09/10/2016)    Past Surgical History:  Procedure Laterality Date   APPENDECTOMY     LAPAROSCOPIC APPENDECTOMY N/A 09/07/2016   Procedure: APPENDECTOMY LAPAROSCOPIC converted  to open appendectomy;  Surgeon: Abigail Miyamoto, MD;  Location: [facility] OR;  Service: General;  Laterality: N/A;    Family History  Problem Relation Age of Onset   Diabetes Neg Hx    Hypertension Neg Hx    Colon cancer Neg Hx    Pancreatic cancer Neg Hx    Esophageal cancer Neg Hx    Liver cancer Neg Hx    Stomach cancer Neg Hx     Social History Reviewed with no changes to be made today.   Outpatient Medications Prior to Visit  Medication Sig Dispense Refill   sildenafil (VIAGRA) 100 MG tablet Take 0.5-1 tablets (50-100 mg total) by mouth daily as needed for erectile dysfunction. 10 tablet 11   amLODipine (NORVASC) 10 MG tablet Take 1 tablet (10 mg total) by mouth daily. 90 tablet 1   aspirin EC 81 MG tablet Take 1 tablet (81 mg total) by mouth daily. Swallow whole. 90 tablet 3   atorvastatin (LIPITOR) 20 MG tablet Take 1 tablet (20 mg total) by mouth daily. 90 tablet 1   donepezil (ARICEPT) 10 MG tablet Take 1 tablet (10 mg total) by mouth at bedtime. Start 1/2 tablet daily x 1 month and then 1 tablet daily 30 tablet 3   losartan (COZAAR) 50 MG tablet Take 1 tablet (50 mg total) by mouth daily. 90 tablet 1   senna-docusate (SENOKOT-S) 8.6-50 MG tablet Take 2 tablets by mouth daily. For constipation 180 tablet 3   spironolactone (ALDACTONE) 25 MG tablet Take 1 tablet (25 mg total) by mouth daily. 90 tablet 1   No facility-administered medications prior to visit.    No Known Allergies     Objective:    BP 119/73 (BP Location: Left Arm, Patient Position: Sitting, Cuff Size: Normal)   Pulse 70   Ht 5\' 11"  (1.803 m)   Wt 160 lb (72.6 kg)   SpO2 100%   BMI 22.32 kg/m  Wt Readings from Last 3 Encounters:  08/28/23 160 lb (72.6 kg)  07/29/23 163 lb 6.4 oz (74.1 kg)  04/24/23 172 lb (78 kg)    Physical Exam Vitals and nursing note reviewed.  Constitutional:      Appearance: He is well-developed.  HENT:     Head: Normocephalic and atraumatic.  Cardiovascular:     Rate and  Rhythm: Normal rate and regular rhythm.     Heart sounds: Normal heart sounds. No murmur heard.    No friction rub. No gallop.  Pulmonary:     Effort: Pulmonary effort is normal. No tachypnea or respiratory distress.     Breath sounds: Normal breath sounds. No decreased breath sounds, wheezing, rhonchi or rales.  Chest:     Chest wall: No tenderness.  Abdominal:     General: Bowel sounds are normal.     Palpations: Abdomen is soft.  Musculoskeletal:        General: Normal range of motion.     Cervical back: Normal range of motion.  Skin:    General: Skin is warm and dry.  Neurological:     Mental Status: He is alert  and oriented to person, place, and time.     Coordination: Coordination normal.  Psychiatric:        Behavior: Behavior normal. Behavior is cooperative.        Thought Content: Thought content normal.        Judgment: Judgment normal.          Patient has been counseled extensively about nutrition and exercise as well as the importance of adherence with medications and regular follow-up. The patient was given clear instructions to go to ER or return to medical center if symptoms don't improve, worsen or new problems develop. The patient verbalized understanding.   Follow-up: Return for  see me in 3months after dec 26. See nurse for right ear irrigation. Claiborne Rigg, FNP-BC [facility] and [facility] [place], [place] [phone]   08/28/2023, 12:16 PM

## 2023-09-03 ENCOUNTER — Ambulatory Visit: Payer: Medicare HMO | Attending: Family Medicine

## 2023-09-05 DIAGNOSIS — G4700 Insomnia, unspecified: Secondary | ICD-10-CM | POA: Diagnosis not present

## 2023-09-05 DIAGNOSIS — I69351 Hemiplegia and hemiparesis following cerebral infarction affecting right dominant side: Secondary | ICD-10-CM | POA: Diagnosis not present

## 2023-09-05 DIAGNOSIS — E7800 Pure hypercholesterolemia, unspecified: Secondary | ICD-10-CM | POA: Diagnosis not present

## 2023-09-05 DIAGNOSIS — I10 Essential (primary) hypertension: Secondary | ICD-10-CM | POA: Diagnosis not present

## 2023-09-05 DIAGNOSIS — Z556 Problems related to health literacy: Secondary | ICD-10-CM | POA: Diagnosis not present

## 2023-09-05 DIAGNOSIS — K5900 Constipation, unspecified: Secondary | ICD-10-CM | POA: Diagnosis not present

## 2023-09-05 DIAGNOSIS — K5790 Diverticulosis of intestine, part unspecified, without perforation or abscess without bleeding: Secondary | ICD-10-CM | POA: Diagnosis not present

## 2023-09-05 DIAGNOSIS — H9191 Unspecified hearing loss, right ear: Secondary | ICD-10-CM | POA: Diagnosis not present

## 2023-09-05 DIAGNOSIS — F03B0 Unspecified dementia, moderate, without behavioral disturbance, psychotic disturbance, mood disturbance, and anxiety: Secondary | ICD-10-CM | POA: Diagnosis not present

## 2023-09-06 ENCOUNTER — Other Ambulatory Visit: Payer: Self-pay | Admitting: Nurse Practitioner

## 2023-09-06 DIAGNOSIS — Z1211 Encounter for screening for malignant neoplasm of colon: Secondary | ICD-10-CM

## 2023-09-06 DIAGNOSIS — Z1212 Encounter for screening for malignant neoplasm of rectum: Secondary | ICD-10-CM

## 2023-09-12 DIAGNOSIS — H9191 Unspecified hearing loss, right ear: Secondary | ICD-10-CM | POA: Diagnosis not present

## 2023-09-12 DIAGNOSIS — K5790 Diverticulosis of intestine, part unspecified, without perforation or abscess without bleeding: Secondary | ICD-10-CM | POA: Diagnosis not present

## 2023-09-12 DIAGNOSIS — I10 Essential (primary) hypertension: Secondary | ICD-10-CM | POA: Diagnosis not present

## 2023-09-12 DIAGNOSIS — E7800 Pure hypercholesterolemia, unspecified: Secondary | ICD-10-CM | POA: Diagnosis not present

## 2023-09-12 DIAGNOSIS — F03B0 Unspecified dementia, moderate, without behavioral disturbance, psychotic disturbance, mood disturbance, and anxiety: Secondary | ICD-10-CM | POA: Diagnosis not present

## 2023-09-12 DIAGNOSIS — I69351 Hemiplegia and hemiparesis following cerebral infarction affecting right dominant side: Secondary | ICD-10-CM | POA: Diagnosis not present

## 2023-09-12 DIAGNOSIS — G4700 Insomnia, unspecified: Secondary | ICD-10-CM | POA: Diagnosis not present

## 2023-09-12 DIAGNOSIS — K5900 Constipation, unspecified: Secondary | ICD-10-CM | POA: Diagnosis not present

## 2023-09-12 DIAGNOSIS — Z556 Problems related to health literacy: Secondary | ICD-10-CM | POA: Diagnosis not present

## 2023-10-01 ENCOUNTER — Other Ambulatory Visit: Payer: Self-pay | Admitting: Nurse Practitioner

## 2023-10-01 ENCOUNTER — Ambulatory Visit: Payer: Medicare HMO | Attending: Nurse Practitioner

## 2023-10-01 VITALS — Ht 71.0 in | Wt 160.0 lb

## 2023-10-01 DIAGNOSIS — Z1211 Encounter for screening for malignant neoplasm of colon: Secondary | ICD-10-CM

## 2023-10-01 DIAGNOSIS — Z0000 Encounter for general adult medical examination without abnormal findings: Secondary | ICD-10-CM

## 2023-10-01 NOTE — Patient Instructions (Addendum)
James Riley , Thank you for taking time to come for your Medicare Wellness Visit. I appreciate your ongoing commitment to your health goals. Please review the following plan we discussed and let me know if I can assist you in the future.   Referrals/Orders/Follow-Ups/Clinician Recommendations: [facility] for screening colonoscopy.  This is a list of the screening recommended for you and due dates:  Health Maintenance  Topic Date Due   Cologuard (Stool DNA test)  Never done   COVID-19 Vaccine (4 - 2023-24 season) 08/04/2023   Zoster (Shingles) Vaccine (1 of 2) 10/29/2023*   Screening for Lung Cancer  02/22/2024   Medicare Annual Wellness Visit  09/30/2024   Pneumonia Vaccine  Completed   Flu Shot  Completed   Hepatitis C Screening  Completed   HPV Vaccine  Aged Out   DTaP/Tdap/Td vaccine  Discontinued  *Topic was postponed. The date shown is not the original due date.    Advanced directives: (Declined) Advance directive discussed with you today. Even though you declined this today, please call our office should you change your mind, and we can give you the proper paperwork for you to fill out.  Next Medicare Annual Wellness Visit scheduled for next year: Yes

## 2023-10-01 NOTE — Progress Notes (Signed)
Subjective:   James Riley is a 72 y.o. male who presents for Medicare Annual/Subsequent preventive examination.  Visit Complete: Virtual I connected with  James Riley on 10/01/23 by a audio enabled telemedicine application and verified that I am speaking with the correct person using two identifiers.  Patient Location: Home  Provider Location: Office/Clinic  I discussed the limitations of evaluation and management by telemedicine. The patient expressed understanding and agreed to proceed.  Vital Signs: Because this visit was a virtual/telehealth visit, some criteria may be missing or patient reported. Any vitals not documented were not able to be obtained and vitals that have been documented are patient reported.  Cardiac Risk Factors include: advanced age (>64men, >48 women);dyslipidemia;hypertension;male gender;smoking/ tobacco exposure;sedentary lifestyle     Objective:    Today's Vitals   10/01/23 1343  Weight: 160 lb (72.6 kg)  Height: 5\' 11"  (1.803 m)  PainSc: 0-No pain   Body mass index is 22.32 kg/m.     10/01/2023    1:45 PM 07/25/2023    9:47 PM 04/07/2023    5:30 PM 08/17/2021    1:00 PM 04/05/2017    2:17 PM 02/18/2017    3:40 PM 09/10/2016    3:07 PM  Advanced Directives  Does Patient Have a Medical Advance Directive? No No No No No No No  Would patient like information on creating a medical advance directive? No - Patient declined  No - Patient declined No - Patient declined   No - patient declined information    Current Medications (verified) Outpatient Encounter Medications as of 10/01/2023  Medication Sig   amLODipine (NORVASC) 10 MG tablet Take 1 tablet (10 mg total) by mouth daily.   aspirin EC 81 MG tablet Take 1 tablet (81 mg total) by mouth daily. Swallow whole.   atorvastatin (LIPITOR) 20 MG tablet Take 1 tablet (20 mg total) by mouth daily.   donepezil (ARICEPT) 10 MG tablet Take 1 tablet (10 mg total) by mouth at bedtime.   losartan  (COZAAR) 50 MG tablet Take 1 tablet (50 mg total) by mouth daily.   senna-docusate (SENOKOT-S) 8.6-50 MG tablet Take 2 tablets by mouth daily. For constipation   sildenafil (VIAGRA) 100 MG tablet Take 0.5-1 tablets (50-100 mg total) by mouth daily as needed for erectile dysfunction.   spironolactone (ALDACTONE) 25 MG tablet Take 1 tablet (25 mg total) by mouth daily.   No facility-administered encounter medications on file as of 10/01/2023.    Allergies (verified) Patient has no known allergies.   History: Past Medical History:  Diagnosis Date   Hypercholesterolemia    Hypertension    Stroke [facility]) ~ 1989   "little weak on my right side since" (09/10/2016)   Past Surgical History:  Procedure Laterality Date   APPENDECTOMY     LAPAROSCOPIC APPENDECTOMY N/A 09/07/2016   Procedure: APPENDECTOMY LAPAROSCOPIC converted to open appendectomy;  Surgeon: James Miyamoto, MD;  Location: [facility] OR;  Service: General;  Laterality: N/A;   Family History  Problem Relation Age of Onset   Diabetes Neg Hx    Hypertension Neg Hx    Colon cancer Neg Hx    Pancreatic cancer Neg Hx    Esophageal cancer Neg Hx    Liver cancer Neg Hx    Stomach cancer Neg Hx    Social History   Socioeconomic History   Marital status: Divorced    Spouse name: Not on file   Number of children: Not on file   Years  of education: Not on file   Highest education level: Not on file  Occupational History   Not on file  Tobacco Use   Smoking status: Every Day    Current packs/day: 0.50    Average packs/day: 0.5 packs/day for 50.0 years (25.0 ttl pk-yrs)    Types: Cigarettes   Smokeless tobacco: Never  Vaping Use   Vaping status: Never Used  Substance and Sexual Activity   Alcohol use: Not Currently    Alcohol/week: 1.0 standard drink of alcohol    Types: 1 Cans of beer per week    Comment: a beer once a week. As of July 2020 reports no alcohol at all   Drug use: No   Sexual activity: Not Currently  Other Topics  Concern   Not on file  Social History Narrative   Not on file   Social Determinants of Health   Financial Resource Strain: Low Risk  (10/01/2023)   Overall Financial Resource Strain (CARDIA)    Difficulty of Paying Living Expenses: Not hard at all  Food Insecurity: No Food Insecurity (10/01/2023)   Hunger Vital Sign    Worried About Running Out of Food in the Last Year: Never true    Ran Out of Food in the Last Year: Never true  Transportation Needs: No Transportation Needs (10/01/2023)   PRAPARE - Administrator, Civil Service (Medical): No    Lack of Transportation (Non-Medical): No  Physical Activity: Inactive (10/01/2023)   Exercise Vital Sign    Days of Exercise per Week: 0 days    Minutes of Exercise per Session: 0 min  Stress: No Stress Concern Present (10/01/2023)   [employer] of Occupational Health - Occupational Stress Questionnaire    Feeling of Stress : Not at all  Social Connections: Moderately Integrated (10/01/2023)   Social Connection and Isolation Panel [NHANES]    Frequency of Communication with Friends and Family: Three times a week    Frequency of Social Gatherings with Friends and Family: Once a week    Attends Religious Services: More than 4 times per year    Active Member of [employer] or Organizations: Yes    Attends Engineer, structural: More than 4 times per year    Marital Status: Separated    Tobacco Counseling Ready to quit: Not Answered Counseling given: Not Answered   Clinical Intake:  Pre-visit preparation completed: Yes  Pain : No/denies pain Pain Score: 0-No pain     BMI - recorded: 22.32 Nutritional Risks: None Diabetes: No  How often do you need to have someone help you when you read instructions, pamphlets, or other written materials from your doctor or pharmacy?: 1 - Never What is the last grade level you completed in school?: HSG  Interpreter Needed?: No  Information entered by :: James Ottaway N.  Jleigh Striplin, LPN.   Activities of Daily Living    10/01/2023    1:47 PM  In your present state of health, do you have any difficulty performing the following activities:  Hearing? 0  Vision? 0  Difficulty concentrating or making decisions? 1  Walking or climbing stairs? 0  Dressing or bathing? 0  Doing errands, shopping? 1  Preparing Food and eating ? N  Using the Toilet? N  In the past six months, have you accidently leaked urine? N  Do you have problems with loss of bowel control? N  Managing your Medications? Y  Managing your Finances? Y  Housekeeping or managing your Housekeeping? N  Patient Care Team: Claiborne Rigg, NP as PCP - General (Nurse Practitioner) [name], MD as Consulting Physician (Neurology)  Indicate any recent Medical Services you may have received from other than [facility] providers in the past year (date may be approximate).     Assessment:   This is a routine wellness examination for James Riley.  Hearing/Vision screen Hearing Screening - Comments:: Denies hearing difficulties; no hearing aids.   Vision Screening - Comments:: No glasses - not up to date with routine eye exam.    Goals Addressed             This Visit's Progress    Client and brother, Adela Glimpse understands the importance of follow-up with providers by attending scheduled visits.        Depression Screen    10/01/2023    1:58 PM 08/28/2023    9:11 AM 07/29/2023   10:01 AM 04/24/2023   10:51 AM 01/24/2023   11:30 AM 10/23/2022    2:30 PM 06/06/2022    1:51 PM  PHQ 2/9 Scores  PHQ - 2 Score 4 4  2  0 0 1  PHQ- 9 Score 17 17  5  0 0   Exception Documentation   Patient refusal        Fall Risk    10/01/2023    1:47 PM 04/24/2023   10:43 AM 01/24/2023   11:30 AM 10/23/2022    2:22 PM 06/06/2022    1:51 PM  Fall Risk   Falls in the past year? 0 1 0 0 0  Number falls in past yr: 0 1 0 0 0  Injury with Fall? 0 1 0 0 0  Risk for fall due to : No Fall Risks No Fall Risks No Fall  Risks  No Fall Risks  Follow up Falls prevention discussed Falls evaluation completed  Falls evaluation completed     MEDICARE RISK AT HOME: Medicare Risk at Home Any stairs in or around the home?: No If so, are there any without handrails?: No Home free of loose throw rugs in walkways, pet beds, electrical cords, etc?: Yes Adequate lighting in your home to reduce risk of falls?: Yes Life alert?: No Use of a cane, walker or w/c?: No Grab bars in the bathroom?: Yes Shower chair or bench in shower?: No Elevated toilet seat or a handicapped toilet?: Yes  TIMED UP AND GO:  Was the test performed?  No    Cognitive Function: Patient has current diagnosis of cognitive impairment. Patient is followed by neurology for ongoing assessment. Patient is unable to complete screening 6CIT or MMSE.      10/01/2023    2:01 PM 04/03/2023    9:07 AM  MMSE - Mini Mental State Exam  Not completed: Refused   Orientation to time  4  Orientation to Place  5  Registration  3  Attention/ Calculation  0  Recall  0  Language- name 2 objects  2  Language- repeat  0  Language- follow 3 step command  1  Language- read & follow direction  1  Write a sentence  0  Copy design  0  Total score  16        10/01/2023    1:52 PM 08/17/2021    1:02 PM  6CIT Screen  What Year? 4 points 0 points  What month? 3 points 0 points  What time? 3 points 0 points  Count back from 20 4 points 2 points  Months in  reverse 4 points 2 points  Repeat phrase 10 points 6 points  Total Score 28 points 10 points    Immunizations Immunization History  Administered Date(s) Administered   Influenza Whole 09/15/2010   Influenza, High Dose Seasonal PF 09/03/2017, 09/02/2018   Influenza, Seasonal, Injecte, Preservative Fre 07/29/2023   Influenza,inj,Quad PF,6+ Mos 07/28/2019, 08/22/2020, 08/09/2021, 09/03/2022   Influenza-Unspecified 09/17/2017   PFIZER Comirnaty(Gray Top)Covid-19 Tri-Sucrose Vaccine 02/11/2020    PFIZER(Purple Top)SARS-COV-2 Vaccination 03/02/2020, 11/07/2020   Pneumococcal Conjugate-13 05/05/2021   Pneumococcal Polysaccharide-23 09/11/2016    TDAP status: Discontinued  Flu Vaccine status: Up to date  Pneumococcal vaccine status: Up to date  Covid-19 vaccine status: Completed vaccines  Qualifies for Shingles Vaccine? Yes   Zostavax completed No   Shingrix Completed?: No.    Education has been provided regarding the importance of this vaccine. Patient has been advised to call insurance company to determine out of pocket expense if they have not yet received this vaccine. Advised may also receive vaccine at local pharmacy or Health Dept. Verbalized acceptance and understanding.  Screening Tests Health Maintenance  Topic Date Due   Fecal DNA (Cologuard)  Never done   COVID-19 Vaccine (4 - 2023-24 season) 08/04/2023   Zoster Vaccines- Shingrix (1 of 2) 10/29/2023 (Originally 08/05/2000)   Lung Cancer Screening  02/22/2024   Medicare Annual Wellness (AWV)  09/30/2024   Pneumonia Vaccine 26+ Years old  Completed   INFLUENZA VACCINE  Completed   Hepatitis C Screening  Completed   HPV VACCINES  Aged Out   DTaP/Tdap/Td  Discontinued    Health Maintenance  Health Maintenance Due  Topic Date Due   Fecal DNA (Cologuard)  Never done   COVID-19 Vaccine (4 - 2023-24 season) 08/04/2023    Colorectal cancer screening: Type of screening: Colonoscopy. Completed 02/08/2015. Repeat every ? years.  Lung Cancer Screening: (Low Dose CT Chest recommended if Age 30-80 years, 20 pack-year currently smoking OR have quit w/in 15years.) does qualify.   Lung Cancer Screening Referral: due 02/22/2024  Additional Screening:  Hepatitis C Screening: does qualify; Completed 07/28/2019  Vision Screening: Recommended annual ophthalmology exams for early detection of glaucoma and other disorders of the eye. Is the patient up to date with their annual eye exam?  No  Who is the provider or what is the  name of the office in which the patient attends annual eye exams? Patient declined eye exam If pt is not established with a provider, would they like to be referred to a provider to establish care? No .   Dental Screening: Recommended annual dental exams for proper oral hygiene  Diabetic Foot Exam: N/A  Community Resource Referral / Chronic Care Management: CRR required this visit?  No   CCM required this visit?  No     Plan:     I have personally reviewed and noted the following in the patient's chart:   Medical and social history Use of alcohol, tobacco or illicit drugs  Current medications and supplements including opioid prescriptions. Patient is not currently taking opioid prescriptions. Functional ability and status Nutritional status Physical activity Advanced directives List of other physicians Hospitalizations, surgeries, and ER visits in previous 12 months Vitals Screenings to include cognitive, depression, and falls Referrals and appointments  In addition, I have reviewed and discussed with patient certain preventive protocols, quality metrics, and best practice recommendations. A written personalized care plan for preventive services as well as general preventive health recommendations were provided to patient.  Mickeal Needy, LPN   [id number]/[id number]   After Visit Summary: (MyChart) Due to this being a telephonic visit, the after visit summary with patients personalized plan was offered to patient via MyChart   Nurse Notes: See routing comments.

## 2023-10-29 ENCOUNTER — Other Ambulatory Visit: Payer: Self-pay

## 2023-10-29 ENCOUNTER — Encounter: Payer: Self-pay | Admitting: Neurology

## 2023-10-29 ENCOUNTER — Ambulatory Visit: Payer: Medicare HMO | Admitting: Neurology

## 2023-10-29 ENCOUNTER — Ambulatory Visit: Payer: Medicare HMO | Admitting: Nurse Practitioner

## 2023-10-29 VITALS — BP 128/70 | HR 73 | Ht 68.0 in | Wt 167.0 lb

## 2023-10-29 DIAGNOSIS — G309 Alzheimer's disease, unspecified: Secondary | ICD-10-CM | POA: Diagnosis not present

## 2023-10-29 DIAGNOSIS — F0280 Dementia in other diseases classified elsewhere without behavioral disturbance: Secondary | ICD-10-CM | POA: Diagnosis not present

## 2023-10-29 DIAGNOSIS — R413 Other amnesia: Secondary | ICD-10-CM

## 2023-10-29 DIAGNOSIS — F0150 Vascular dementia without behavioral disturbance: Secondary | ICD-10-CM | POA: Diagnosis not present

## 2023-10-29 MED ORDER — MEMANTINE HCL 28 X 5 MG & 21 X 10 MG PO TABS
ORAL_TABLET | ORAL | 12 refills | Status: DC
  Filled 2023-10-29: qty 49, 30d supply, fill #0
  Filled 2023-12-18: qty 49, 30d supply, fill #1

## 2023-10-29 MED ORDER — MEMANTINE HCL 10 MG PO TABS
10.0000 mg | ORAL_TABLET | Freq: Two times a day (BID) | ORAL | 3 refills | Status: DC
Start: 2023-10-29 — End: 2024-03-03
  Filled 2023-10-29 – 2023-11-21 (×2): qty 60, 30d supply, fill #0
  Filled 2023-12-18: qty 60, 30d supply, fill #1
  Filled 2024-02-03: qty 60, 30d supply, fill #2
  Filled 2024-02-17: qty 60, 30d supply, fill #3

## 2023-10-29 NOTE — Progress Notes (Signed)
[facility] [address] [place]. [place] [address] [phone]       OFFICE FOLLOW-UP VISIT NOTE  James Riley Date of Birth:  Apr 25, 1951 Medical Record Number:  [id number]   Referring MD: Bertram Denver, NP  Reason for Referral: Memory loss  HPI: Initial visit 04/03/2023 [name] is a 72 year old African-American male seen today for initial office consultation visit for memory loss.  He is accompanied by his sister.  History is obtained from them and review of electronic medical records.  I personally reviewed pertinent available imaging films in PACS.  He has past medical history of hypertension, hyperlipidemia, remote left pontine infarct with residual mild right-sided weakness in 2011 as well as mild right frontal lacunar infarct July 2020.  He was previously seen last visit with Shanda Bumps nurse practitioner on 09/07/2020.  Patient is referred back today by patient for new concerns of memory loss.  He is unable to provide much of the history is obtained from his sister.  He is referred for evaluation for recent 2 to 3 months of noticed progressive memory loss and cognitive deterioration.  Family has noticed he is forgetful and cannot remember recent information and needs constant reminders.  He is living alone at home independently and most activities of daily living except for finances which are handled by his younger brother.  There have been no unsafe behaviors observed.  He does get frustrated and agitated intermittently.  Not violent.  He does not have delusions or hallucinations.  He is still able to dress himself use the restroom and keep his home clean.  He denies any headache, head injury, loss of consciousness, seizures, new stroke like symptoms, falls or injuries.  There are no issues with his gait and balance.  There is family history of dementia in his mother.  He has 3 older siblings who do not have dementia yet.  He has not had any recent brain imaging  studies, lab work for dementia evaluation.  MRI scan of the brain on 06/04/2019 had shown a right frontal lacunar acute infarct with multiple small chronic infarcts within corpus callosum, bilateral thalamic, left pons, bilateral cerebellum. Update 10/29/2023 : He returns for follow-up after last visit 6 months ago.  He is accompanied by sister.  Patient continues to have an memory and cognitive difficulties.  He is tolerating Aricept well without side effects.  He has been having more hallucinations.  He is seeing and hearing things and voices.  He still living alone with the family providing close support.  Family had to take away his keys as he had confusion between putting fuel and windshield wiper and to his car.  Does get confused and disoriented from time to time.  Family understands that he may not be able to live alone by himself for much longer and they have initiated discussions about health power of attorney and financial power of attorney with yet.  On Mini-Mental status testing today he scored 19/30 which is slightly improved from last visit.  He remains pleasant and can be easily directed.  He does not wander off or get lost.  He has not exhibited any unsafe behavior.  He has had no falls or injuries. Prior visit 09/07/2020, [name] returns for 28-month stroke follow-up.  He has been stable since prior visit without new or reoccurring stroke/TIA symptoms.  Remains on aspirin 81 mg daily and atorvastatin 20 mg daily for secondary stroke prevention of side effects.  Blood pressure today 157/26  continuing on spironolactone, amlodipine and losartan managed by PCP.  He does not routinely monitor at home.  Continued tobacco use without any interest in cessation.  No concerns at this time.     History provided for reference purposes only Update 01/05/2020 JM: [name] is a 72 year old male who is being seen today for stroke follow-up.  He has been doing well from a stroke standpoint without new or  reoccurring stroke/TIA symptoms.  He does continue to have residual mild RUE weakness from prior stroke but denies worsening.  He continues to live independently maintaining all ADLs and IADLs without difficulty.  Previously on aspirin 81 mg daily but apparently patient discontinued this at some point as he did not feel this was needed or he was unable to pick up prescription (confusing reasoning).  He is agreeable to restart if a new prescription is sent in (Per epic review, PCP recently sent in refill on 12/25/2019 but then was discontinued by PCP today).  Denies bleeding or bruising while on aspirin.  Continues on atorvastatin 20 mg daily for secondary stroke prevention without side effects.  Recent lipid panel on 12/25/2019 showed LDL 86.  Blood pressure today 138/76. He does not routinely monitor blood pressure at home.  He does endorse ongoing compliance with all prescribed antihypertensives.  Ongoing tobacco use but he is unsure daily amount as he will frequently get out his cigarettes to friends and neighbors.  He is not interested in quitting at this time despite knowing risks.  Previously on Viagra prescribed by PCP and is questioning if he can continue ongoing.  He was not able to pick up additional refills and is questioning reasoning.  No further concerns at this time.    Initial visit 09/10/2019 [name]: [name] is a 72 year old African-American male seen today for initial office consultation visit for stroke.  History is obtained from the patient, review of electronic medical records and have personally reviewed imaging films in PACS.  He has a past medical history of hypertension, hyperlipidemia, prediabetes and remote left pontine stroke with residual mild right-sided weakness since 2011.  He was seen in the emergency room at [facility] on 06/03/2019 with symptoms beginning the day prior.  He complained of generalized weakness dizziness lightheadedness and some difficulty walking.  Due to  multitude of complaints an MRI scan of the brain was obtained which showed a small area of restricted diffusion in the right posterior frontal white matter which was felt to be an acute infarct.  Patient denied any specific symptoms of slurred speech left facial weakness of left arm or body weakness.  He admitted that he must have been dehydrated as he had not been eating and drinking well and is unclear whether his increased right-sided weakness and gait difficulties were worsening of his old stroke due to dehydration.  CT angiogram of the brain and neck were obtained which showed no significant large vessel stenosis or occlusion.  A 2 mm outpouching from supraclinoid left ICA was noted which likely represented a infundibulum rather than an aneurysm.  The patient refused to get a 2D echo as he wanted to go home.  LDL cholesterol was 60 mg percent and hemoglobin A1c was 5.6.  Patient was not on any antithrombotics prior to admission and was placed on aspirin and Plavix which also apparently has not been taking.  He states is done well his gait and walking are back to baseline.  His blood pressure seems well controlled and today  it is 148/75.  Patient was counseled to quit smoking but states he has cut back a bit but is still smoking.  Is tolerating Lipitor well without muscle aches and pains.  He has no new complaints. ROS:   14 system review of systems is positive for memory loss, getting lost, disorientation, all other systems negative  PMH:  Past Medical History:  Diagnosis Date   Hypercholesterolemia    Hypertension    Stroke [facility]) ~ 1989   "little weak on my right side since" (09/10/2016)    Social History:  Social History   Socioeconomic History   Marital status: Divorced    Spouse name: Not on file   Number of children: Not on file   Years of education: Not on file   Highest education level: Not on file  Occupational History   Not on file  Tobacco Use   Smoking status: Every Day     Current packs/day: 0.50    Average packs/day: 0.5 packs/day for 50.0 years (25.0 ttl pk-yrs)    Types: Cigarettes   Smokeless tobacco: Never  Vaping Use   Vaping status: Never Used  Substance and Sexual Activity   Alcohol use: Not Currently    Alcohol/week: 1.0 standard drink of alcohol    Types: 1 Cans of beer per week    Comment: a beer once a week. As of July 2020 reports no alcohol at all   Drug use: No   Sexual activity: Not Currently  Other Topics Concern   Not on file  Social History Narrative   Not on file   Social Determinants of Health   Financial Resource Strain: Low Risk  (10/01/2023)   Overall Financial Resource Strain (CARDIA)    Difficulty of Paying Living Expenses: Not hard at all  Food Insecurity: No Food Insecurity (10/01/2023)   Hunger Vital Sign    Worried About Running Out of Food in the Last Year: Never true    Ran Out of Food in the Last Year: Never true  Transportation Needs: No Transportation Needs (10/01/2023)   PRAPARE - Administrator, Civil Service (Medical): No    Lack of Transportation (Non-Medical): No  Physical Activity: Inactive (10/01/2023)   Exercise Vital Sign    Days of Exercise per Week: 0 days    Minutes of Exercise per Session: 0 min  Stress: No Stress Concern Present (10/01/2023)   [employer] of Occupational Health - Occupational Stress Questionnaire    Feeling of Stress : Not at all  Social Connections: Moderately Integrated (10/01/2023)   Social Connection and Isolation Panel [NHANES]    Frequency of Communication with Friends and Family: Three times a week    Frequency of Social Gatherings with Friends and Family: Once a week    Attends Religious Services: More than 4 times per year    Active Member of [employer] or Organizations: Yes    Attends Banker Meetings: More than 4 times per year    Marital Status: Separated  Intimate Partner Violence: Not At Risk (10/01/2023)   Humiliation, Afraid, Rape,  and Kick questionnaire    Fear of Current or Ex-Partner: No    Emotionally Abused: No    Physically Abused: No    Sexually Abused: No    Medications:   Current Outpatient Medications on File Prior to Visit  Medication Sig Dispense Refill   amLODipine (NORVASC) 10 MG tablet Take 1 tablet (10 mg total) by mouth daily. 90 tablet 1  aspirin EC 81 MG tablet Take 1 tablet (81 mg total) by mouth daily. Swallow whole.     atorvastatin (LIPITOR) 20 MG tablet Take 1 tablet (20 mg total) by mouth daily. 90 tablet 1   donepezil (ARICEPT) 10 MG tablet Take 1 tablet (10 mg total) by mouth at bedtime. 90 tablet 0   losartan (COZAAR) 50 MG tablet Take 1 tablet (50 mg total) by mouth daily. 90 tablet 1   senna-docusate (SENOKOT-S) 8.6-50 MG tablet Take 2 tablets by mouth daily. For constipation 200 tablet 3   sildenafil (VIAGRA) 100 MG tablet Take 0.5-1 tablets (50-100 mg total) by mouth daily as needed for erectile dysfunction. 10 tablet 11   spironolactone (ALDACTONE) 25 MG tablet Take 1 tablet (25 mg total) by mouth daily. 90 tablet 1   No current facility-administered medications on file prior to visit.    Allergies:  No Known Allergies  Physical Exam General: well developed, well nourished pleasant elderly African-American male, seated, in no evident distress Head: head normocephalic and atraumatic.   Neck: supple with no carotid or supraclavicular bruits Cardiovascular: regular rate and rhythm, no murmurs Musculoskeletal: no deformity Skin:  no rash/petichiae Vascular:  Normal pulses all extremities  Neurologic Exam Mental Status: Awake and fully alert. Oriented to place and time. Recent and remote memory poor. Attention span, concentration and fund of knowledge diminished mood and affect appropriate.  Recall 0/3.  Clock drawing 1/4.  Able to name only 8 animals which can walk on 4 legs.  Mini-Mental status exam scored  19/30 ( last visit 16/30.)   Unable to copy intersecting  pentagons. Cranial Nerves: Fundoscopic exam reveals sharp disc margins. Pupils equal, briskly reactive to light. Extraocular movements full without nystagmus. Visual fields full to confrontation. Hearing intact. Facial sensation intact.  Light nasolabial fold asymmetry.  Tongue, palate moves normally and symmetrically.  Motor: Normal bulk and tone. Normal strength in all tested extremity muscles.  Diminished fine finger movements on the right.  Orbits left or right upper extremity. Sensory.: intact to touch , pinprick , position and vibratory sensation.  Coordination: Rapid alternating movements normal in all extremities. Finger-to-nose and heel-to-shin performed accurately bilaterally. Gait and Station: Arises from chair without difficulty. Stance is normal. Gait demonstrates normal stride length and balance . Able to heel, toe and tandem walk with slight difficulty.  Reflexes: 1+ and symmetric. Toes downgoing.        10/29/2023    3:17 PM 10/01/2023    2:01 PM 04/03/2023    9:07 AM  MMSE - Mini Mental State Exam  Not completed:  Refused   Orientation to time 4  4  Orientation to Place 5  5  Registration 3  3  Attention/ Calculation 0  0  Recall 2  0  Language- name 2 objects 2  2  Language- repeat 0  0  Language- follow 3 step command 1  1  Language- read & follow direction 1  1  Write a sentence 1  0  Copy design 0  0  Total score 19  16      ASSESSMENT: 72 year old African-American male with progressive subacute memory loss and cognitive impairment likely due to mixed dementia with vascular and Alzheimer's.  Prior history of right frontal lacunar infarct in 2020 and remote left pontine stroke in 2011.  Vascular risk factors of hypertension, hyperlipidemia and previous strokes.  He has shown some response to Aricept      PLAN: I had a long discussion  with the patient and his sister regarding his memory loss and rapidly progressive dementia and discussed plan for evaluation and  treatment and answered questions.  Continue Aricept 10 mg daily and as he is tolerating it well.  Add Namenda titration pack and increase if tolerated to 10 mg twice daily.  I also advised them to make arrangements about health power of attorney and medical power of attorney for the patient.  He may not be able to live alone safely and family may have to look at alternative living arrangements for him.    Continue aspirin for stroke prevention and maintain aggressive risk factor modification with strict control of hypertension with blood pressure goal below 140/90 lipids with LDL goal below 70 mg percent.He will return for follow-up in 4 months or call earlier if needed..Greater than 50% time during this 35-minute  visit was spent on counseling and coordination of care about his memory loss and strokes and discussion about evaluation and treatment and answering questions. Delia Heady, MD Note: This document was prepared with digital dictation and possible smart phrase technology. Any transcriptional errors that result from this process are unintentional.

## 2023-10-29 NOTE — Patient Instructions (Signed)
I had a long discussion with the patient and his sister regarding his memory loss and rapidly progressive dementia and discussed plan for evaluation and treatment and answered questions.  Continue Aricept 10 mg daily and as he is tolerating it well.  Add Namenda titration pack and increase if tolerated to 10 mg twice daily.  I also advised them to make arrangements about health power of attorney and medical power of attorney for the patient.  He may not be able to live alone safely and family may have to look at alternative living arrangements for him.    Continue aspirin for stroke prevention and maintain aggressive risk factor modification with strict control of hypertension with blood pressure goal below 140/90 lipids with LDL goal below 70 mg percent.He will return for follow-up in 4 months or call earlier if needed..  Alzheimer's Disease Caregiver Guide Alzheimer's disease is a condition that makes a person: Forget things. Act differently. Have trouble paying attention and doing simple tasks. Have trouble talking and responding to your questions. These things get worse with time. The tips below can help you care for the person. How to help manage lifestyle changes Tips to help with symptoms Be calm and patient. Give simple, short answers to questions. Long answers can confuse the person. Avoid correcting the person in a negative way. Do not argue with the person. This may make the person more upset. Try not to take things personally, even if the person forgets your name. Tips to lessen frustration Use simple words and a calm voice. Only give one direction at a time. Do daily tasks, like bathing and dressing, when the person is at their best. Also make any appointments for these times. Take your time. Simple tasks may take longer. Allow plenty of time to get tasks done. Limit choices for the person. Too many choices can be stressful. Involve the person in what you're doing. Keep things  organized: Keep a daily routine. Organize medicines in a pillbox for each day of the week. Keep a calendar in a central place. Use it to remind the person of health care visits or other activities. Avoid new or crowded places, if possible. Buy clothes and shoes for the person that are easy to put on and take off. Try to change the subject if the person becomes frustrated or angry. This is a great way to make things less tense. Tips to prevent injury  Keep floors clear. Remove rugs, magazine racks, and floor lamps. Keep hallways well-lit, especially at night. Put a handrail and nonslip mat in the bathtub or shower. Put childproof locks on cabinets that have dangerous items in them. These items include medicine, alcohol, guns, cleaning products, and sharp tools. Put locks on doors where the person can't see or reach them. This helps keep the person from going out of the house and getting lost. Remove car keys and lock garage doors so the person doesn't try to drive. Be ready for emergencies. Keep a list of emergency phone numbers and addresses close by. Bracelets may be worn that track location and identify the person as having memory loss. This should be worn at all times for safety. Tips for the future  Discuss financial and legal planning early. Get help from a professional. People with this disease have trouble managing their money as the disease gets worse. Talk about advance directives, safety, and daily care. Take these steps: Create a living will and choose a power of attorney. This is someone who  can make decisions for the person with Alzheimer's disease when they can no longer do so. Discuss driving safety and when to stop driving. The person's health care provider can help with this. If the person lives alone, make sure they're safe. Some people need extra help at home. Other people need more care at a nursing home or care center. How to recognize changes in the person's  condition With this disease, memory problems and confusion slowly get worse. In time, the person may not know their friends and family members. The disease can cause changes in behavior and mood, such as anger or feeling worried or nervous. The person may also hallucinate. This means they see, hear, taste, smell, or feel things that aren't real. These changes can come on all of a sudden. They may happen in response to something such as: Pain. An infection. Changes in temperature. Too much stimulation or noise. Feeling lost or scared. Medicines. Where to find support Find out about services that can provide short-term care for the person. This is called respite care. It can allow you to take a break when you need one. Join a support group near you. These groups can help you: Learn ways to manage stress. Share experiences with others. Get emotional comfort and support. Learn about caregiving as the disease gets worse. Find community resources. Where to find more information Alzheimer's Association: [employer] Contact a health care provider if: The person has a fever. The person has a sudden change in behavior that doesn't get better when you try to help calm them. The person has a sudden increase in confusion or new hallucinations. The person is not able to take care of themselves at home. You are no longer able to care for the person. Get help right away if: You feel like the person may hurt themselves or others. The person has talked about taking their own life. These symptoms may be an emergency. Take one of these steps right away: Go to your nearest emergency room. Call 911. Call the National Suicide Prevention Lifeline at 8037268686 or 988. Text the Crisis Text Line at 763-739-9893. This information is not intended to replace advice given to you by your health care provider. Make sure you discuss any questions you have with your health care provider. Document Revised: 02/04/2023  Document Reviewed: 01/17/2023 Elsevier Patient Education  2024 [employer].

## 2023-10-30 ENCOUNTER — Other Ambulatory Visit: Payer: Self-pay

## 2023-10-30 ENCOUNTER — Other Ambulatory Visit: Payer: Self-pay | Admitting: Pharmacist

## 2023-10-30 NOTE — Progress Notes (Signed)
Pharmacy Quality Measure Review  This patient is appearing on a report for being at risk of failing the adherence measure for cholesterol (statin) and hypertension (ACEi/ARB) medications this calendar year.   Medication: atorvastatin Last fill date: 08/28/2023 for 90 day supply  Insurance report was not up to date. No action needed at this time.   Medication: losartan  Last fill date: 08/28/23 for 90 day supply  Insurance report was not up to date. No action needed at this time.   Butch Penny, PharmD, P[place]y [place], CPP Clinical Pharmacist [facility] [phone]

## 2023-11-01 ENCOUNTER — Other Ambulatory Visit: Payer: Self-pay

## 2023-11-07 ENCOUNTER — Other Ambulatory Visit: Payer: Self-pay

## 2023-11-11 ENCOUNTER — Telehealth: Payer: Self-pay | Admitting: Neurology

## 2023-11-11 NOTE — Telephone Encounter (Signed)
Mr. Burrough's brother, who is his POA and on the DPR asked if Dr. Could give him a letter that he can show to his brother letting him know he cannot drive anymore. Pt is very stern about wanting to drive and family does not want him to. Brother feels it'll be easier if he could see something physical stating the above.

## 2023-11-20 ENCOUNTER — Encounter: Payer: Self-pay | Admitting: Neurology

## 2023-11-20 NOTE — Telephone Encounter (Signed)
A letter has been written and send through mychart. Called the brother and made aware the letter is ready and placed at the front desk for pick up. Pt's brother was appreciative.

## 2023-11-21 ENCOUNTER — Other Ambulatory Visit: Payer: Self-pay

## 2023-11-21 ENCOUNTER — Other Ambulatory Visit: Payer: Self-pay | Admitting: Nurse Practitioner

## 2023-11-21 DIAGNOSIS — F03B0 Unspecified dementia, moderate, without behavioral disturbance, psychotic disturbance, mood disturbance, and anxiety: Secondary | ICD-10-CM

## 2023-11-21 MED ORDER — DONEPEZIL HCL 10 MG PO TABS
10.0000 mg | ORAL_TABLET | Freq: Every day | ORAL | 0 refills | Status: DC
  Filled 2023-11-21: qty 30, 30d supply, fill #0

## 2023-11-22 ENCOUNTER — Other Ambulatory Visit: Payer: Self-pay

## 2023-12-02 ENCOUNTER — Other Ambulatory Visit: Payer: Self-pay

## 2023-12-06 ENCOUNTER — Ambulatory Visit: Payer: Medicare HMO | Attending: Nurse Practitioner | Admitting: Nurse Practitioner

## 2023-12-11 ENCOUNTER — Other Ambulatory Visit: Payer: Self-pay

## 2023-12-18 ENCOUNTER — Other Ambulatory Visit: Payer: Self-pay

## 2023-12-20 ENCOUNTER — Other Ambulatory Visit: Payer: Self-pay

## 2024-01-13 ENCOUNTER — Other Ambulatory Visit: Payer: Self-pay | Admitting: Family Medicine

## 2024-01-13 DIAGNOSIS — F03B0 Unspecified dementia, moderate, without behavioral disturbance, psychotic disturbance, mood disturbance, and anxiety: Secondary | ICD-10-CM

## 2024-01-13 MED ORDER — DONEPEZIL HCL 10 MG PO TABS
10.0000 mg | ORAL_TABLET | Freq: Every day | ORAL | 0 refills | Status: DC
  Filled 2024-01-13 – 2024-02-03 (×2): qty 30, 30d supply, fill #0

## 2024-01-14 ENCOUNTER — Other Ambulatory Visit: Payer: Self-pay

## 2024-01-24 ENCOUNTER — Other Ambulatory Visit: Payer: Self-pay

## 2024-02-03 ENCOUNTER — Other Ambulatory Visit: Payer: Self-pay

## 2024-02-07 ENCOUNTER — Other Ambulatory Visit: Payer: Self-pay

## 2024-02-17 ENCOUNTER — Other Ambulatory Visit: Payer: Self-pay

## 2024-02-17 ENCOUNTER — Other Ambulatory Visit: Payer: Self-pay | Admitting: Nurse Practitioner

## 2024-02-17 DIAGNOSIS — E782 Mixed hyperlipidemia: Secondary | ICD-10-CM

## 2024-02-17 DIAGNOSIS — F03B0 Unspecified dementia, moderate, without behavioral disturbance, psychotic disturbance, mood disturbance, and anxiety: Secondary | ICD-10-CM

## 2024-02-17 DIAGNOSIS — I10 Essential (primary) hypertension: Secondary | ICD-10-CM

## 2024-02-28 ENCOUNTER — Other Ambulatory Visit: Payer: Self-pay

## 2024-03-03 ENCOUNTER — Other Ambulatory Visit: Payer: Self-pay | Admitting: Nurse Practitioner

## 2024-03-03 DIAGNOSIS — F03B0 Unspecified dementia, moderate, without behavioral disturbance, psychotic disturbance, mood disturbance, and anxiety: Secondary | ICD-10-CM

## 2024-03-03 DIAGNOSIS — E782 Mixed hyperlipidemia: Secondary | ICD-10-CM

## 2024-03-03 DIAGNOSIS — I10 Essential (primary) hypertension: Secondary | ICD-10-CM

## 2024-03-03 NOTE — Telephone Encounter (Signed)
 Copied from CRM [id number]. Topic: Clinical - Medication Refill >> Mar 03, 2024 10:36 AM Franchot Heidelberg wrote: Most Recent Primary Care Visit:  Provider: Mickeal Needy  Department: [facility]  Visit Type: MEDICARE AWV, SEQUENTIAL  Date: 10/01/2023  Medication: donepezil (ARICEPT) 10 MG tablet memantine (NAMENDA) 10 MG tablet amLODipine (NORVASC) 10 MG tablet atorvastatin (LIPITOR) 20 MG tablet  losartan (COZAAR) 50 MG tablet spironolactone (ALDACTONE) 25 MG tablet  Has the patient contacted their pharmacy? Yes (Agent: If no, request that the patient contact the pharmacy for the refill. If patient does not wish to contact the pharmacy document the reason why and proceed with request.) (Agent: If yes, when and what did the pharmacy advise?)  Is this the correct pharmacy for this prescription? Yes If no, delete pharmacy and type the correct one.  This is the patient's preferred pharmacy:  [facility] [address], [address] [place] [address] Phone: [phone] Fax: [phone]   Has the prescription been filled recently? Yes  Is the patient out of the medication? Yes  Has the patient been seen for an appointment in the last year OR does the patient have an upcoming appointment? Yes  Can we respond through MyChart? Yes  Agent: Please be advised that Rx refills may take up to 3 business days. We ask that you follow-up with your pharmacy.

## 2024-03-05 ENCOUNTER — Other Ambulatory Visit: Payer: Self-pay

## 2024-03-05 MED ORDER — AMLODIPINE BESYLATE 10 MG PO TABS
10.0000 mg | ORAL_TABLET | Freq: Every day | ORAL | 0 refills | Status: DC
Start: 2024-03-05 — End: 2024-05-28
  Filled 2024-03-05: qty 90, 90d supply, fill #0

## 2024-03-05 NOTE — Telephone Encounter (Signed)
 Requested Prescriptions  Pending Prescriptions Disp Refills   amLODipine (NORVASC) 10 MG tablet 90 tablet 0    Sig: Take 1 tablet (10 mg total) by mouth daily.     Cardiovascular: Calcium Channel Blockers 2 Passed - 03/05/2024  5:44 PM      Passed - Last BP in normal range    BP Readings from Last 1 Encounters:  10/29/23 128/70         Passed - Last Heart Rate in normal range    Pulse Readings from Last 1 Encounters:  10/29/23 73         Passed - Valid encounter within last 6 months    Recent Outpatient Visits           6 months ago Lumbar radiculopathy   [place] [facility] [place]. [facility] Claiborne Rigg, NP   7 months ago Primary hypertension   [place] [facility] - A Dept Of [place]. [facility] Claiborne Rigg, NP   10 months ago Primary hypertension   [place] [facility] - A Dept Of [place]. [facility] Claiborne Rigg, NP   1 year ago Primary hypertension   [place] [facility] - A Dept Of [place]. [facility] Claiborne Rigg, NP   1 year ago Primary hypertension   [place] [facility] [place] - A Dept Of [place]. [facility] [place], [place] W, NP               atorvastatin (LIPITOR) 20 MG tablet 90 tablet 1    Sig: Take 1 tablet (20 mg total) by mouth daily.     Cardiovascular:  Antilipid - Statins Failed - 03/05/2024  5:44 PM      Failed - Lipid Panel in normal range within the last 12 months    Cholesterol, Total  Date Value Ref Range Status  06/06/2022 126 100 - 199 mg/dL Final   LDL Chol Calc (NIH)  Date Value Ref Range Status  06/06/2022 68 0 - 99 mg/dL Final   HDL  Date Value Ref Range Status  06/06/2022 38 (L) >39 mg/dL Final   Triglycerides  Date Value Ref Range Status  06/06/2022 111 0 - 149 mg/dL Final         Passed - Patient is not pregnant      Passed - Valid encounter within last 12 months    Recent Outpatient  Visits           6 months ago Lumbar radiculopathy   [place] [facility] Dept Of [place]. [facility] Cla[facility]orne Rigg, NP   7 months ago Primary hypertension   [place] [facility] - A Dept Of [place]. [facility] Claiborne Rigg, NP   10 months ago Primary hypertension   [place] [facility] - A Dept Of [place]. [facility] Claiborne Rigg, NP   1 year ago Primary hypertension   [place] [facility] [place] - A Dept Of [place]. [facility] Claiborne Rigg, NP   1 year ago Primary hypertension    [facility] [place] - A Dept Of [place]. [facility] [place], [place] W, NP               donepezil (ARICEPT) 10 MG tablet 30 tablet 0  Sig: Take 1 tablet (10 mg total) by mouth at bedtime.     Neurology:  Alzheimer's Agents Passed - 03/05/2024  5:44 PM      Passed - Valid encounter within last 6 months    Recent Outpatient Visits           6 months ago Lumbar radiculopathy   [place] [facility] - A Dept Of [place]. [facility] Claiborne Rigg, NP   7 months ago Primary hypertension   [place] [facility] [place] - A Dept Of [place]. [facility] Claiborne Rigg, NP   10 months ago Primary hypertension   [place] [facility] [place] - A Dept Of [place]. [facility] Claiborne Rigg, NP   1 year ago Primary hypertension   [place] [facility] [place] - A Dept Of [place]. [facility] Claiborne Rigg, NP   1 year ago Primary hypertension   [place] [facility] [place] - A Dept Of [place]. [facility] [place], [place] W, NP               losartan (COZAAR) 50 MG tablet 90 tablet 1    Sig: Take 1 tablet (50 mg total) by mouth daily.     Cardiovascular:  Angiotensin Receptor Blockers Failed - 03/05/2024  5:44 PM      Failed - Cr in normal range and within 180 days    Creatinine, Ser  Date Value  Ref Range Status  07/25/2023 1.05 0.61 - 1.24 mg/dL Final   Creatinine, POC  Date Value Ref Range Status  04/05/2017 300 mg/dL Final   Creatinine, Urine  Date Value Ref Range Status  06/03/2019 29.13 mg/dL Final    Comment:    Performed at [facility], [address] [place], [place] [address]         Failed - K in normal range and within 180 days    Potassium  Date Value Ref Range Status  07/25/2023 4.2 3.5 - 5.1 mmol/L Final    Comment:    HEMOLYSIS AT THIS LEVEL MAY AFFECT RESULT         Passed - Patient is not pregnant      Passed - Last BP in normal range    BP Readings from Last 1 Encounters:  10/29/23 128/70         Passed - Valid encounter within last 6 months    Recent Outpatient Visits           6 months ago Lumbar radiculopathy   [place] [facility] [place] - A Dept Of [place]. [facility] Claiborne Rigg, NP   7 months ago Primary hypertension   [place] [facility] [place] - A Dept Of [place]. [facility] Claiborne Rigg, NP   10 months ago Primary hypertension   [place] [facility] - A Dept Of [place]. [facility] Claiborne Rigg, NP   1 year ago Primary hypertension   [place] [facility] - A Dept Of [place]. [facility] Claiborne Rigg, NP   1 year ago Primary hypertension   [place] [facility] [place] - A Dept Of . [facility] [place], [place] W, NP               memantine (NAMENDA) 10 MG tablet 60 tablet 3  Sig: Take 1 tablet (10 mg total) by mouth 2 (two) times daily.     Neurology:  Alzheimer's Agents 2 Passed - 03/05/2024  5:44 PM      Passed - Cr in normal range and within 360 days    Creatinine, Ser  Date Value Ref Range Status  07/25/2023 1.05 0.61 - 1.24 mg/dL Final   Creatinine, POC  Date Value Ref Range Status  04/05/2017 300 mg/dL Final   Creatinine, Urine  Date Value Ref Range Status  06/03/2019 29.13 mg/dL Final     Comment:    Performed at [facility], [address] [place], [place] [address]         Passed - eGFR is 5 or above and within 360 days    GFR calc Af Amer  Date Value Ref Range Status  12/30/2020 67 >59 mL/min/1.73 Final    Comment:    **In accordance with recommendations from the NKF-ASN Task force,**   [facility] is in the process of updating its eGFR calculation to the   2021 CKD-EPI creatinine equation that estimates kidney function   without a race variable.    GFR, Estimated  Date Value Ref Range Status  07/25/2023 >60 >60 mL/min Final    Comment:    (NOTE) Calculated using the CKD-EPI Creatinine Equation (2021)    eGFR  Date Value Ref Range Status  10/23/2022 91 >59 mL/min/1.73 Final         Passed - Valid encounter within last 6 months    Recent Outpatient Visits           6 months ago Lumbar radiculopathy   [place] [facility] - A Dept Of [place]. [facility] Claiborne Rigg, NP   7 months ago Primary hypertension   [place] [facility] - A Dept Of [place]. [facility] Claiborne Rigg, NP   10 months ago Primary hypertension   [place] [facility] - A Dept Of [place]. [facility] Claiborne Rigg, NP   1 year ago Primary hypertension   [place] [facility] [place] - A Dept Of [place]. [facility] Claiborne Rigg, NP   1 year ago Primary hypertension    [facility] [place] - A Dept Of [place]. [facility] [place], [place] W, NP               spironolactone (ALDACTONE) 25 MG tablet 90 tablet 1    Sig: Take 1 tablet (25 mg total) by mouth daily.     Cardiovascular: Diuretics - Aldosterone Antagonist Failed - 03/05/2024  5:44 PM      Failed - Cr in normal range and within 180 days    Creatinine, Ser  Date Value Ref Range Status  07/25/2023 1.05 0.61 - 1.24 mg/dL Final   Creatinine, POC  Date Value Ref Range Status  04/05/2017 300 mg/dL Final    Creatinine, Urine  Date Value Ref Range Status  06/03/2019 29.13 mg/dL Final    Comment:    Performed at [facility], [address] [place], [place] [address]         Failed - K in normal range and within 180 days    Potassium  Date Value Ref Range Status  07/25/2023 4.2 3.5 - 5.1 mmol/L Final    Comment:    HEMOLYSIS AT THIS LEVEL MAY AFFECT RESULT  Failed - Na in normal range and within 180 days    Sodium  Date Value Ref Range Status  07/25/2023 140 135 - 145 mmol/L Final  10/23/2022 144 134 - 144 mmol/L Final         Failed - eGFR is 30 or above and within 180 days    GFR calc Af Amer  Date Value Ref Range Status  12/30/2020 67 >59 mL/min/1.73 Final    Comment:    **In accordance with recommendations from the NKF-ASN Task force,**   [facility] is in the process of updating its eGFR calculation to the   2021 CKD-EPI creatinine equation that estimates kidney function   without a race variable.    GFR, Estimated  Date Value Ref Range Status  07/25/2023 >60 >60 mL/min Final    Comment:    (NOTE) Calculated using the CKD-EPI Creatinine Equation (2021)    eGFR  Date Value Ref Range Status  10/23/2022 91 >59 mL/min/1.73 Final         Passed - Last BP in normal range    BP Readings from Last 1 Encounters:  10/29/23 128/70         Passed - Valid encounter within last 6 months    Recent Outpatient Visits           6 months ago Lumbar radiculopathy   [place] [facility] [place] - A Dept Of [place]. [facility] Claiborne Rigg, NP   7 months ago Primary hypertension   [place] [facility] - A Dept Of [place]. [facility] Claiborne Rigg, NP   10 months ago Primary hypertension   [place] [facility] - A Dept Of [place]. [facility] Claiborne Rigg, NP   1 year ago Primary hypertension   [place] [facility] [place] - A Dept Of [place]. [facility] Claiborne Rigg, NP   1  year ago Primary hypertension   [place] [facility] [place] - A Dept Of [place]. [facility] Claiborne Rigg, [place]

## 2024-03-05 NOTE — Telephone Encounter (Signed)
 Requested medication (s) are due for refill today: yes  Requested medication (s) are on the active medication list: yes  Last refill:  08/28/23 for all except memantine 10/29/23 and Donepezil 01/13/24  Future visit scheduled: yes  Notes to clinic:  Unable to refill per protocol due to failed labs, no updated results. Unable to refill per protocol, last refill by another provider. For Donepezil and memantine.     Requested Prescriptions  Pending Prescriptions Disp Refills   atorvastatin (LIPITOR) 20 MG tablet 90 tablet 1    Sig: Take 1 tablet (20 mg total) by mouth daily.     Cardiovascular:  Antilipid - Statins Failed - 03/05/2024  5:44 PM      Failed - Lipid Panel in normal range within the last 12 months    Cholesterol, Total  Date Value Ref Range Status  06/06/2022 126 100 - 199 mg/dL Final   LDL Chol Calc (NIH)  Date Value Ref Range Status  06/06/2022 68 0 - 99 mg/dL Final   HDL  Date Value Ref Range Status  06/06/2022 38 (L) >39 mg/dL Final   Triglycerides  Date Value Ref Range Status  06/06/2022 111 0 - 149 mg/dL Final         Passed - Patient is not pregnant      Passed - Valid encounter within last 12 months    Recent Outpatient Visits           6 months ago Lumbar radiculopathy   [place] [facility] - A Dept Of [place]. [facility] Claiborne Rigg, NP   7 months ago Primary hypertension   [place] [facility] [place] - A Dept Of [place]. [facility] Claiborne Rigg, NP   10 months ago Primary hypertension   [place] [facility] T[facility] - A Dept Of [place]. [facility] Claiborne Rigg, NP   1 year ago Primary hypertension   [place] [facility] [place] - A Dept Of [place]. [facility] Claiborne Rigg, NP   1 year ago Primary hypertension   [place] [facility] [place] - A Dept Of [place]. [facility] [place], [place] W, NP               donepezil (ARICEPT) 10 MG tablet 30  tablet 0    Sig: Take 1 tablet (10 mg total) by mouth at bedtime.     Neurology:  Alzheimer's Agents Passed - 03/05/2024  5:44 PM      Passed - Valid encounter within last 6 months    Recent Outpatient Visits           6 months ago Lumbar radiculopathy   [place] [facility] - A Dept Of [place]. [facility] Claiborne Rigg, NP   7 months ago Primary hypertension   [place] [facility] C[facility] [place] - A Dept Of [place]. [facility] Claiborne Rigg, NP   10 months ago Primary hypertension   [place] [facility] [place] - A Dept Of [place]. [facility] Claiborne Rigg, NP   1 year ago Primary hypertension    Comm [facility] [place] - A Dept Of [place]. [facility] Claiborne Rigg, NP   1 year ago Primary hypertension    Comm [facility] [place] - A Dept Of . [facility] Claiborne Rigg, [place]  losartan (COZAAR) 50 MG tablet 90 tablet 1    Sig: Take 1 tablet (50 mg total) by mouth daily.     Cardiovascular:  Angiotensin Receptor Blockers Failed - 03/05/2024  5:44 PM      Failed - Cr in normal range and within 180 days    Creatinine, Ser  Date Value Ref Range Status  07/25/2023 1.05 0.61 - 1.24 mg/dL Final   Creatinine, POC  Date Value Ref Range Status  04/05/2017 300 mg/dL Final   Creatinine, Urine  Date Value Ref Range Status  06/03/2019 29.13 mg/dL Final    Comment:    Performed at [facility], [address], [place], [place] [address]         Failed - K in normal range and within 180 days    Potassium  Date Value Ref Range Status  07/25/2023 4.2 3.5 - 5.1 mmol/L Final    Comment:    HEMOLYSIS AT THIS LEVEL MAY AFFECT RESULT         Passed - Patient is not pregnant      Passed - Last BP in normal range    BP Readings from Last 1 Encounters:  10/29/23 128/70         Passed - Valid encounter within last 6 months    Recent Outpatient Visits           6  months ago Lumbar radiculopathy   [place] [facility] [place] [facility] [place] - A Dept Of [place]. [facility] Claiborne Rigg, NP   7 months ago Primary hypertension   [place] [facility] [place] - A Dept Of [place]. [facility] Claiborne Rigg, NP   10 months ago Primary hypertension   [place] [facility] [place] - A Dept Of [place]. [facility] Claiborne Rigg, NP   1 year ago Primary hypertension   [place] [facility] [place] - A Dept Of [place]. [facility] Claiborne Rigg, NP   1 year ago Primary hypertension   [place] [facility] [place] - A Dept Of . [facility] [place], [place] W, NP               memantine (NAMENDA) 10 MG tablet 60 tablet 3    Sig: Take 1 tablet (10 mg total) by mouth 2 (two) times daily.     Neurology:  Alzheimer's Agents 2 Passed - 03/05/2024  5:44 PM      Passed - Cr in normal range and within 360 days    Creatinine, Ser  Date Value Ref Range Status  07/25/2023 1.05 0.61 - 1.24 mg/dL Final   Creatinine, POC  Date Value Ref Range Status  04/05/2017 300 mg/dL Final   Creatinine, Urine  Date Value Ref Range Status  06/03/2019 29.13 mg/dL Final    Comment:    Performed at [facility], [address], [place], [place] [address]         Passed - eGFR is 5 or above and within 360 days    GFR calc Af Amer  Date Value Ref Range Status  12/30/2020 67 >59 mL/min/1.73 Final    Comment:    **In accordance with recommendations from the NKF-ASN Task force,**   [facility] is in the process of updating its eGFR calculation to the   2021 CKD-EPI creatinine equation that estimates kidney function   without a race variable.    GFR, Estimated  Date Value Ref Range Status  07/25/2023 >60 >60 mL/min Final    Comment:    (NOTE) Calculated using the CKD-EPI Creatinine Equation (2021)    eGFR  Date Value Ref Range Status  10/23/2022 91 >59 mL/min/1.73 Final         Passed - Valid  encounter within last 6 months    Recent Outpatient Visits           6 months ago Lumbar radiculopathy   [place] [facility] - A Dept Of [place]. [facility] Claiborne Rigg, NP   7 months ago Primary hypertension   [place] [facility] [place] - A Dept Of [place]. [facility] Claiborne Rigg, NP   10 months ago Primary hypertension   [place] [facility] - A Dept Of [place]. [facility] Claiborne Rigg, NP   1 year ago Primary hypertension   [place] [facility] Al[facility] - A Dept Of [place]. [facility] Claiborne Rigg, NP   1 year ago Primary hypertension   [place] [facility] [place] - A Dept Of . [facility] [place], [place] W, NP               spironolactone (ALDACTONE) 25 MG tablet 90 tablet 1    Sig: Take 1 tablet (25 mg total) by mouth daily.     Cardiovascular: Diuretics - Aldosterone Antagonist Failed - 03/05/2024  5:44 PM      Failed - Cr in normal range and within 180 days    Creatinine, Ser  Date Value Ref Range Status  07/25/2023 1.05 0.61 - 1.24 mg/dL Final   Creatinine, POC  Date Value Ref Range Status  04/05/2017 300 mg/dL Final   Creatinine, Urine  Date Value Ref Range Status  06/03/2019 29.13 mg/dL Final    Comment:    Performed at [facility], [address], [place], [place] [address]         Failed - K in normal range and within 180 days    Potassium  Date Value Ref Range Status  07/25/2023 4.2 3.5 - 5.1 mmol/L Final    Comment:    HEMOLYSIS AT THIS LEVEL MAY AFFECT RESULT         Failed - Na in normal range and within 180 days    Sodium  Date Value Ref Range Status  07/25/2023 140 135 - 145 mmol/L Final  10/23/2022 144 134 - 144 mmol/L Final         Failed - eGFR is 30 or above and within 180 days    GFR calc Af Amer  Date Value Ref Range Status  12/30/2020 67 >59 mL/min/1.73 Final    Comment:    **In accordance with recommendations  from the NKF-ASN Task force,**   [facility] is in the process of updating its eGFR calculation to the   2021 CKD-EPI creatinine equation that estimates kidney function   without a race variable.    GFR, Estimated  Date Value Ref Range Status  07/25/2023 >60 >60 mL/min Final    Comment:    (NOTE) Calculated using the CKD-EPI Creatinine Equation (2021)    eGFR  Date Value Ref Range Status  10/23/2022 91 >59 mL/min/1.73 Final         Passed - Last BP in normal range    BP Readings from Last 1 Encounters:  10/29/23 128/70  Passed - Valid encounter within last 6 months    Recent Outpatient Visits           6 months ago Lumbar radiculopathy   [place] [facility] - A Dept Of [place]. [facility] Claiborne Rigg, NP   7 months ago Primary hypertension   [place] [facility] [place] - A Dept Of [place]. [facility] Claiborne Rigg, NP   10 months ago Primary hypertension   [place] [facility] [place] - A Dept Of [place]. [address] [facility] Claiborne Rigg, NP   1 year ago Primary hypertension   [place] [facility] [place] - A Dept Of [place]. [facility] Claiborne Rigg, NP   1 year ago Primary hypertension   [place] [facility] [place] - A Dept Of [place]. [facility] Claiborne Rigg, NP              Signed Prescriptions Disp Refills   amLODipine (NORVASC) 10 MG tablet 90 tablet 0    Sig: Take 1 tablet (10 mg total) by mouth daily.     Cardiovascular: Calcium Channel Blockers 2 Passed - 03/05/2024  5:44 PM      Passed - Last BP in normal range    BP Readings from Last 1 Encounters:  10/29/23 128/70         Passed - Last Heart Rate in normal range    Pulse Readings from Last 1 Encounters:  10/29/23 73         Passed - Valid encounter within last 6 months    Recent Outpatient Visits           6 months ago Lumbar radiculopathy   [place] [facility] [place] - A Dept Of [place].  [facility] Claiborne Rigg, NP   7 months ago Primary hypertension   [place] [facility] [place] - A Dept Of [place]. [facility] Claiborne Rigg, NP   10 months ago Primary hypertension   [place] [facility] [place] - A Dept Of [place]. [facility] Claiborne Rigg, NP   1 year ago Primary hypertension    Comm [facility] [place] - A Dept Of [place]. [facility] Claiborne Rigg, NP   1 year ago Primary hypertension    Comm [facility] [place] - A Dept Of [place]. [facility] Claiborne Rigg, [place]

## 2024-03-06 ENCOUNTER — Other Ambulatory Visit: Payer: Self-pay

## 2024-03-06 MED ORDER — MEMANTINE HCL 10 MG PO TABS
10.0000 mg | ORAL_TABLET | Freq: Two times a day (BID) | ORAL | 1 refills | Status: DC
  Filled 2024-03-06: qty 180, 90d supply, fill #0
  Filled 2024-07-03: qty 180, 90d supply, fill #1

## 2024-03-06 MED ORDER — SPIRONOLACTONE 25 MG PO TABS
25.0000 mg | ORAL_TABLET | Freq: Every day | ORAL | 1 refills | Status: DC
Start: 2024-03-06 — End: 2024-08-18
  Filled 2024-03-06: qty 90, 90d supply, fill #0
  Filled 2024-05-31: qty 90, 90d supply, fill #1

## 2024-03-06 MED ORDER — DONEPEZIL HCL 10 MG PO TABS
10.0000 mg | ORAL_TABLET | Freq: Every day | ORAL | 1 refills | Status: DC
Start: 2024-03-06 — End: 2024-08-17
  Filled 2024-03-06: qty 90, 90d supply, fill #0
  Filled 2024-07-03: qty 90, 90d supply, fill #1

## 2024-03-06 MED ORDER — ATORVASTATIN CALCIUM 20 MG PO TABS
20.0000 mg | ORAL_TABLET | Freq: Every day | ORAL | 1 refills | Status: DC
  Filled 2024-03-06: qty 90, 90d supply, fill #0
  Filled 2024-05-31: qty 90, 90d supply, fill #1

## 2024-03-06 MED ORDER — LOSARTAN POTASSIUM 50 MG PO TABS
50.0000 mg | ORAL_TABLET | Freq: Every day | ORAL | 1 refills | Status: DC
  Filled 2024-03-06: qty 90, 90d supply, fill #0
  Filled 2024-05-31: qty 90, 90d supply, fill #1

## 2024-03-13 ENCOUNTER — Ambulatory Visit: Attending: Nurse Practitioner | Admitting: Nurse Practitioner

## 2024-03-13 ENCOUNTER — Encounter: Payer: Self-pay | Admitting: Nurse Practitioner

## 2024-03-13 ENCOUNTER — Other Ambulatory Visit: Payer: Self-pay

## 2024-03-13 VITALS — BP 121/69 | HR 60 | Resp 19 | Ht 71.0 in | Wt 177.0 lb

## 2024-03-13 DIAGNOSIS — I10 Essential (primary) hypertension: Secondary | ICD-10-CM

## 2024-03-13 DIAGNOSIS — R7989 Other specified abnormal findings of blood chemistry: Secondary | ICD-10-CM

## 2024-03-13 DIAGNOSIS — H7291 Unspecified perforation of tympanic membrane, right ear: Secondary | ICD-10-CM | POA: Diagnosis not present

## 2024-03-13 DIAGNOSIS — R7303 Prediabetes: Secondary | ICD-10-CM

## 2024-03-13 DIAGNOSIS — Z122 Encounter for screening for malignant neoplasm of respiratory organs: Secondary | ICD-10-CM | POA: Diagnosis not present

## 2024-03-13 MED ORDER — AMOXICILLIN-POT CLAVULANATE 875-125 MG PO TABS
1.0000 | ORAL_TABLET | Freq: Two times a day (BID) | ORAL | 0 refills | Status: AC
Start: 2024-03-13 — End: 2024-03-26
  Filled 2024-03-13: qty 20, 10d supply, fill #0

## 2024-03-13 NOTE — Patient Instructions (Signed)
 [facility] [address]  [phone]

## 2024-03-13 NOTE — Progress Notes (Signed)
 Assessment & Plan:  James Riley was seen today for medical management of chronic issues.  Diagnoses and all orders for this visit:  Primary hypertension -     CMP14+EGFR  Screening for lung cancer -     CT CHEST LUNG CA SCREEN LOW DOSE W/O CM; Future  Prediabetes -     Hemoglobin A1c  Abnormal CBC -     CBC with Differential  Perforation of right tympanic membrane -     amoxicillin -clavulanate (AUGMENTIN ) 875-125 MG tablet; Take 1 tablet by mouth 2 (two) times daily for 10 days. -     Ambulatory referral to ENT    Patient has been counseled on age-appropriate routine health concerns for screening and prevention. These are reviewed and up-to-date. Referrals have been placed accordingly. Immunizations are up-to-date or declined.    Subjective:   Chief Complaint  Patient presents with   Medical Management of Chronic Issues    James Riley 73 y.o. male presents to office today for HTN  He has a past medical history of Hypercholesterolemia, Hypertension, Early dementia, and Stroke (~ 1989).    HTN Blood pressure at goal. Currently prescribed spironolactone , losartan , and amlodipine  BP Readings from Last 3 Encounters:  03/13/24 121/69  10/29/23 128/70  08/28/23 119/73    He is experiencing sounds of  "echoing" in both ear R>L.   Prediabetes A1c at goal. Diet controlled.  Lab Results  Component Value Date   HGBA1C 5.7 (H) 03/13/2024     Review of Systems  Constitutional:  Negative for fever, malaise/fatigue and weight loss.  HENT: Negative.  Negative for nosebleeds.        SEE HPI  Eyes: Negative.  Negative for blurred vision, double vision and photophobia.  Respiratory: Negative.  Negative for cough and shortness of breath.   Cardiovascular: Negative.  Negative for chest pain, palpitations and leg swelling.  Gastrointestinal: Negative.  Negative for heartburn, nausea and vomiting.  Musculoskeletal: Negative.  Negative for myalgias.  Neurological: Negative.   Negative for dizziness, focal weakness, seizures and headaches.  Psychiatric/Behavioral: Negative.  Negative for suicidal ideas.     Past Medical History:  Diagnosis Date   Hypercholesterolemia    Hypertension    Stroke [facility]) ~ 1989   "little weak on my right side since" (09/10/2016)    Past Surgical History:  Procedure Laterality Date   APPENDECTOMY     LAPAROSCOPIC APPENDECTOMY N/A 09/07/2016   Procedure: APPENDECTOMY LAPAROSCOPIC converted to open appendectomy;  Surgeon: Oza Blumenthal, MD;  Location: [facility] OR;  Service: General;  Laterality: N/A;    Family History  Problem Relation Age of Onset   Diabetes Neg Hx    Hypertension Neg Hx    Colon cancer Neg Hx    Pancreatic cancer Neg Hx    Esophageal cancer Neg Hx    Liver cancer Neg Hx    Stomach cancer Neg Hx     Social History Reviewed with no changes to be made today.   Outpatient Medications Prior to Visit  Medication Sig Dispense Refill   amLODipine  (NORVASC ) 10 MG tablet Take 1 tablet (10 mg total) by mouth daily. 90 tablet 0   aspirin  EC 81 MG tablet Take 1 tablet (81 mg total) by mouth daily. Swallow whole.     atorvastatin  (LIPITOR) 20 MG tablet Take 1 tablet (20 mg total) by mouth daily. 90 tablet 1   donepezil  (ARICEPT ) 10 MG tablet Take 1 tablet (10 mg total) by mouth at bedtime. 90 tablet  1   losartan  (COZAAR ) 50 MG tablet Take 1 tablet (50 mg total) by mouth daily. 90 tablet 1   memantine  (NAMENDA ) 10 MG tablet Take 1 tablet (10 mg total) by mouth 2 (two) times daily. 180 tablet 1   senna-docusate (SENOKOT-S) 8.6-50 MG tablet Take 2 tablets by mouth daily. For constipation 200 tablet 3   sildenafil  (VIAGRA ) 100 MG tablet Take 0.5-1 tablets (50-100 mg total) by mouth daily as needed for erectile dysfunction. 10 tablet 11   spironolactone  (ALDACTONE ) 25 MG tablet Take 1 tablet (25 mg total) by mouth daily. 90 tablet 1   No facility-administered medications prior to visit.    No Known Allergies      Objective:    BP 121/69 (BP Location: Left Arm, Patient Position: Sitting, Cuff Size: Normal)   Pulse 60   Resp 19   Ht 5\' 8"  (1.727 m)   Wt 177 lb (80.3 kg)   SpO2 100%   BMI 26.91 kg/m  Wt Readings from Last 3 Encounters:  03/13/24 177 lb (80.3 kg)  10/29/23 167 lb (75.8 kg)  10/01/23 160 lb (72.6 kg)    Physical Exam Vitals and nursing note reviewed.  Constitutional:      Appearance: He is well-developed.  HENT:     Head: Normocephalic and atraumatic.     Right Ear: Tympanic membrane is perforated.  Cardiovascular:     Rate and Rhythm: Normal rate and regular rhythm.     Heart sounds: Normal heart sounds. No murmur heard.    No friction rub. No gallop.  Pulmonary:     Effort: Pulmonary effort is normal. No tachypnea or respiratory distress.     Breath sounds: Normal breath sounds. No decreased breath sounds, wheezing, rhonchi or rales.  Chest:     Chest wall: No tenderness.  Abdominal:     General: Bowel sounds are normal.     Palpations: Abdomen is soft.  Musculoskeletal:        General: Normal range of motion.     Cervical back: Normal range of motion.  Skin:    General: Skin is warm and dry.  Neurological:     Mental Status: He is alert and oriented to person, place, and time.     Coordination: Coordination normal.  Psychiatric:        Behavior: Behavior normal. Behavior is cooperative.        Thought Content: Thought content normal.        Judgment: Judgment normal.          Patient has been counseled extensively about nutrition and exercise as well as the importance of adherence with medications and regular follow-up. The patient was given clear instructions to go to ER or return to medical center if symptoms don't improve, worsen or new problems develop. The patient verbalized understanding.   Follow-up: Return in about 3 months (around 06/12/2024).   Collins Dean, FNP-BC [facility] and [facility] [place],  [place] [phone]   04/27/2024, 12:41 AM

## 2024-03-14 LAB — CBC WITH DIFFERENTIAL/PLATELET
Basophils Absolute: 0.1 10*3/uL (ref 0.0–0.2)
Basos: 1 %
EOS (ABSOLUTE): 0.2 10*3/uL (ref 0.0–0.4)
Eos: 3 %
Hematocrit: 41 % (ref 37.5–51.0)
Hemoglobin: 14.6 g/dL (ref 13.0–17.7)
Immature Grans (Abs): 0 10*3/uL (ref 0.0–0.1)
Immature Granulocytes: 0 %
Lymphocytes Absolute: 2.3 10*3/uL (ref 0.7–3.1)
Lymphs: 30 %
MCH: 33.8 pg — ABNORMAL HIGH (ref 26.6–33.0)
MCHC: 35.6 g/dL (ref 31.5–35.7)
MCV: 95 fL (ref 79–97)
Monocytes Absolute: 0.5 10*3/uL (ref 0.1–0.9)
Monocytes: 7 %
Neutrophils Absolute: 4.6 10*3/uL (ref 1.4–7.0)
Neutrophils: 59 %
Platelets: 191 10*3/uL (ref 150–450)
RBC: 4.32 x10E6/uL (ref 4.14–5.80)
RDW: 12.5 % (ref 11.6–15.4)
WBC: 7.8 10*3/uL (ref 3.4–10.8)

## 2024-03-14 LAB — CMP14+EGFR
ALT: 28 IU/L (ref 0–44)
AST: 24 IU/L (ref 0–40)
Albumin: 4.8 g/dL (ref 3.8–4.8)
Alkaline Phosphatase: 91 IU/L (ref 44–121)
BUN/Creatinine Ratio: 11 (ref 10–24)
BUN: 13 mg/dL (ref 8–27)
Bilirubin Total: 0.4 mg/dL (ref 0.0–1.2)
CO2: 21 mmol/L (ref 20–29)
Calcium: 9.7 mg/dL (ref 8.6–10.2)
Chloride: 102 mmol/L (ref 96–106)
Creatinine, Ser: 1.18 mg/dL (ref 0.76–1.27)
Globulin, Total: 2.7 g/dL (ref 1.5–4.5)
Glucose: 198 mg/dL — ABNORMAL HIGH (ref 70–99)
Potassium: 3.4 mmol/L — ABNORMAL LOW (ref 3.5–5.2)
Sodium: 140 mmol/L (ref 134–144)
Total Protein: 7.5 g/dL (ref 6.0–8.5)
eGFR: 65 mL/min/{1.73_m2} (ref >59)

## 2024-03-14 LAB — HEMOGLOBIN A1C
Est. average glucose Bld gHb Est-mCnc: 117 mg/dL
Hgb A1c MFr Bld: 5.7 % — ABNORMAL HIGH (ref 4.8–5.6)

## 2024-03-15 ENCOUNTER — Encounter: Payer: Self-pay | Admitting: Nurse Practitioner

## 2024-03-16 ENCOUNTER — Other Ambulatory Visit: Payer: Self-pay

## 2024-03-24 ENCOUNTER — Encounter: Payer: Self-pay | Admitting: Nurse Practitioner

## 2024-03-27 ENCOUNTER — Ambulatory Visit
Admission: RE | Admit: 2024-03-27 | Discharge: 2024-03-27 | Disposition: A | Discharge status: Home / Self Care | Source: Ambulatory Visit | Attending: Nurse Practitioner | Admitting: Nurse Practitioner

## 2024-03-27 DIAGNOSIS — Z122 Encounter for screening for malignant neoplasm of respiratory organs: Secondary | ICD-10-CM | POA: Diagnosis not present

## 2024-03-27 DIAGNOSIS — F17210 Nicotine dependence, cigarettes, uncomplicated: Secondary | ICD-10-CM | POA: Diagnosis not present

## 2024-04-19 ENCOUNTER — Ambulatory Visit: Payer: Self-pay | Admitting: Nurse Practitioner

## 2024-04-20 ENCOUNTER — Other Ambulatory Visit: Payer: Self-pay

## 2024-04-21 ENCOUNTER — Other Ambulatory Visit: Payer: Self-pay

## 2024-04-27 ENCOUNTER — Encounter: Payer: Self-pay | Admitting: Nurse Practitioner

## 2024-05-04 ENCOUNTER — Other Ambulatory Visit: Payer: Self-pay

## 2024-05-05 ENCOUNTER — Other Ambulatory Visit: Payer: Self-pay

## 2024-05-11 ENCOUNTER — Other Ambulatory Visit (HOSPITAL_COMMUNITY): Payer: Self-pay

## 2024-05-11 ENCOUNTER — Other Ambulatory Visit: Payer: Self-pay

## 2024-05-11 DIAGNOSIS — B369 Superficial mycosis, unspecified: Secondary | ICD-10-CM | POA: Diagnosis not present

## 2024-05-11 DIAGNOSIS — H6240 Otitis externa in other diseases classified elsewhere, unspecified ear: Secondary | ICD-10-CM | POA: Diagnosis not present

## 2024-05-11 DIAGNOSIS — H6123 Impacted cerumen, bilateral: Secondary | ICD-10-CM | POA: Diagnosis not present

## 2024-05-11 DIAGNOSIS — T161XXA Foreign body in right ear, initial encounter: Secondary | ICD-10-CM | POA: Diagnosis not present

## 2024-05-11 MED ORDER — PREDNISOLONE ACETATE 1 % OP SUSP
OPHTHALMIC | 0 refills | Status: DC
  Filled 2024-05-11: qty 5, 13d supply, fill #0

## 2024-05-11 MED ORDER — ACETIC ACID 2 % OT SOLN
3.0000 [drp] | Freq: Three times a day (TID) | OTIC | 1 refills | Status: AC
Start: 2024-05-11 — End: ?
  Filled 2024-05-11: qty 15, 34d supply, fill #0
  Filled 2024-07-03: qty 15, 34d supply, fill #1

## 2024-05-25 DIAGNOSIS — B369 Superficial mycosis, unspecified: Secondary | ICD-10-CM | POA: Diagnosis not present

## 2024-05-25 DIAGNOSIS — H6123 Impacted cerumen, bilateral: Secondary | ICD-10-CM | POA: Diagnosis not present

## 2024-05-25 DIAGNOSIS — H6240 Otitis externa in other diseases classified elsewhere, unspecified ear: Secondary | ICD-10-CM | POA: Diagnosis not present

## 2024-05-28 ENCOUNTER — Other Ambulatory Visit: Payer: Self-pay | Admitting: Nurse Practitioner

## 2024-05-28 ENCOUNTER — Other Ambulatory Visit: Payer: Self-pay

## 2024-05-28 DIAGNOSIS — I10 Essential (primary) hypertension: Secondary | ICD-10-CM

## 2024-05-28 MED ORDER — AMLODIPINE BESYLATE 10 MG PO TABS
10.0000 mg | ORAL_TABLET | Freq: Every day | ORAL | 0 refills | Status: DC
  Filled 2024-05-28: qty 90, 90d supply, fill #0

## 2024-05-29 ENCOUNTER — Other Ambulatory Visit: Payer: Self-pay

## 2024-06-08 ENCOUNTER — Other Ambulatory Visit: Payer: Self-pay

## 2024-06-22 ENCOUNTER — Ambulatory Visit: Payer: Medicare HMO | Admitting: Neurology

## 2024-07-03 ENCOUNTER — Other Ambulatory Visit: Payer: Self-pay

## 2024-08-17 ENCOUNTER — Other Ambulatory Visit: Payer: Self-pay

## 2024-08-17 ENCOUNTER — Other Ambulatory Visit: Payer: Self-pay | Admitting: Nurse Practitioner

## 2024-08-17 ENCOUNTER — Other Ambulatory Visit: Payer: Self-pay | Admitting: Family Medicine

## 2024-08-17 DIAGNOSIS — F03B0 Unspecified dementia, moderate, without behavioral disturbance, psychotic disturbance, mood disturbance, and anxiety: Secondary | ICD-10-CM

## 2024-08-17 DIAGNOSIS — I10 Essential (primary) hypertension: Secondary | ICD-10-CM

## 2024-08-17 DIAGNOSIS — E782 Mixed hyperlipidemia: Secondary | ICD-10-CM

## 2024-08-18 ENCOUNTER — Other Ambulatory Visit: Payer: Self-pay

## 2024-08-18 ENCOUNTER — Other Ambulatory Visit: Payer: Self-pay | Admitting: Nurse Practitioner

## 2024-08-18 DIAGNOSIS — F03B0 Unspecified dementia, moderate, without behavioral disturbance, psychotic disturbance, mood disturbance, and anxiety: Secondary | ICD-10-CM

## 2024-08-18 MED ORDER — AMLODIPINE BESYLATE 10 MG PO TABS
10.0000 mg | ORAL_TABLET | Freq: Every day | ORAL | 0 refills | Status: DC
  Filled 2024-08-18 – 2024-09-21 (×2): qty 90, 90d supply, fill #0

## 2024-08-18 MED ORDER — LOSARTAN POTASSIUM 50 MG PO TABS
50.0000 mg | ORAL_TABLET | Freq: Every day | ORAL | 1 refills | Status: AC
  Filled 2024-08-18 – 2024-09-21 (×2): qty 90, 90d supply, fill #0
  Filled 2024-12-19: qty 90, 90d supply, fill #1

## 2024-08-18 MED ORDER — ATORVASTATIN CALCIUM 20 MG PO TABS
20.0000 mg | ORAL_TABLET | Freq: Every day | ORAL | 1 refills | Status: AC
  Filled 2024-08-18 – 2024-09-21 (×2): qty 90, 90d supply, fill #0
  Filled 2024-12-19: qty 90, 90d supply, fill #1

## 2024-08-18 MED ORDER — MEMANTINE HCL 10 MG PO TABS
10.0000 mg | ORAL_TABLET | Freq: Two times a day (BID) | ORAL | 0 refills | Status: DC
  Filled 2024-08-18 – 2024-10-06 (×2): qty 60, 30d supply, fill #0

## 2024-08-18 MED ORDER — SPIRONOLACTONE 25 MG PO TABS
25.0000 mg | ORAL_TABLET | Freq: Every day | ORAL | 1 refills | Status: AC
  Filled 2024-08-18 – 2024-09-21 (×2): qty 90, 90d supply, fill #0
  Filled 2024-12-19: qty 90, 90d supply, fill #1

## 2024-08-18 MED ORDER — DONEPEZIL HCL 10 MG PO TABS
10.0000 mg | ORAL_TABLET | Freq: Every day | ORAL | 1 refills | Status: DC
  Filled 2024-08-18: qty 90, 90d supply, fill #0

## 2024-08-18 MED ORDER — DONEPEZIL HCL 10 MG PO TABS
10.0000 mg | ORAL_TABLET | Freq: Every day | ORAL | 0 refills | Status: DC
  Filled 2024-08-18 – 2024-10-06 (×2): qty 30, 30d supply, fill #0

## 2024-08-26 ENCOUNTER — Other Ambulatory Visit: Payer: Self-pay

## 2024-09-22 ENCOUNTER — Other Ambulatory Visit: Payer: Self-pay

## 2024-09-25 ENCOUNTER — Other Ambulatory Visit: Payer: Self-pay

## 2024-10-06 ENCOUNTER — Other Ambulatory Visit: Payer: Self-pay

## 2024-10-06 ENCOUNTER — Ambulatory Visit: Payer: Medicare HMO | Attending: Nurse Practitioner

## 2024-10-06 ENCOUNTER — Encounter: Payer: Self-pay | Admitting: Nurse Practitioner

## 2024-10-06 ENCOUNTER — Ambulatory Visit (HOSPITAL_BASED_OUTPATIENT_CLINIC_OR_DEPARTMENT_OTHER): Admitting: Nurse Practitioner

## 2024-10-06 VITALS — Ht 68.0 in | Wt 177.0 lb

## 2024-10-06 VITALS — BP 127/69 | HR 71 | Resp 19 | Ht 71.0 in | Wt 176.6 lb

## 2024-10-06 DIAGNOSIS — R7303 Prediabetes: Secondary | ICD-10-CM | POA: Diagnosis not present

## 2024-10-06 DIAGNOSIS — Z0000 Encounter for general adult medical examination without abnormal findings: Secondary | ICD-10-CM | POA: Diagnosis not present

## 2024-10-06 DIAGNOSIS — Z1211 Encounter for screening for malignant neoplasm of colon: Secondary | ICD-10-CM

## 2024-10-06 DIAGNOSIS — I10 Essential (primary) hypertension: Secondary | ICD-10-CM

## 2024-10-06 DIAGNOSIS — M5416 Radiculopathy, lumbar region: Secondary | ICD-10-CM | POA: Diagnosis not present

## 2024-10-06 DIAGNOSIS — K5909 Other constipation: Secondary | ICD-10-CM

## 2024-10-06 DIAGNOSIS — Z23 Encounter for immunization: Secondary | ICD-10-CM

## 2024-10-06 MED ORDER — SENNOSIDES-DOCUSATE SODIUM 8.6-50 MG PO TABS
2.0000 | ORAL_TABLET | Freq: Two times a day (BID) | ORAL | 2 refills | Status: DC
  Filled 2024-10-06: qty 120, 30d supply, fill #0
  Filled 2024-11-04: qty 120, 30d supply, fill #1
  Filled 2024-12-01: qty 120, 30d supply, fill #2

## 2024-10-06 MED ORDER — MELOXICAM 7.5 MG PO TABS
7.5000 mg | ORAL_TABLET | Freq: Every day | ORAL | 0 refills | Status: DC
  Filled 2024-10-06: qty 30, 30d supply, fill #0

## 2024-10-06 NOTE — Patient Instructions (Addendum)
 Mr. Al,  Thank you for taking the time for your Medicare Wellness Visit. I appreciate your continued commitment to your health goals. Please review the care plan we discussed, and feel free to reach out if I can assist you further.  Please note that Annual Wellness Visits do not include a physical exam. Some assessments may be limited, especially if the visit was conducted virtually. If needed, we may recommend an in-person follow-up with your provider.  Ongoing Care Seeing your primary care provider every 3 to 6 months helps us  monitor your health and provide consistent, personalized care. 11/17/026 at 1:30 pm in office.   Referrals If a referral was made during today's visit and you haven't received any updates within two weeks, please contact the referred provider directly to check on the status.  Recommended Screenings:  Health Maintenance  Topic Date Due   Colon Cancer Screening  Never done   Zoster (Shingles) Vaccine (1 of 2) Never done   Flu Shot  07/03/2024   COVID-19 Vaccine (4 - 2025-26 season) 08/03/2024   Medicare Annual Wellness Visit  09/30/2024   Screening for Lung Cancer  03/27/2025   Pneumococcal Vaccine for age over 14  Completed   Hepatitis C Screening  Completed   Meningitis B Vaccine  Aged Out   DTaP/Tdap/Td vaccine  Discontinued       10/06/2024    1:36 PM  Advanced Directives  Does Patient Have a Medical Advance Directive? Yes  Type of Advance Directive Healthcare Power of Attorney  Copy of Healthcare Power of Attorney in Chart? No - copy requested    Vision: Annual vision screenings are recommended for early detection of glaucoma, cataracts, and diabetic retinopathy. These exams can also reveal signs of chronic conditions such as diabetes and high blood pressure.  Dental: Annual dental screenings help detect early signs of oral cancer, gum disease, and other conditions linked to overall health, including heart disease and diabetes.  Please see the  attached documents for additional preventive care recommendations.

## 2024-10-06 NOTE — Progress Notes (Signed)
 Subjective:   James Riley is a 73 y.o. male who presents for a Medicare Annual Wellness Visit.  Allergies (verified) Patient has no known allergies.   History: Past Medical History:  Diagnosis Date   Hypercholesterolemia    Hypertension    Stroke [facility]) ~ 1989   little weak on my right side since (09/10/2016)   Past Surgical History:  Procedure Laterality Date   APPENDECTOMY     LAPAROSCOPIC APPENDECTOMY N/A 09/07/2016   Procedure: APPENDECTOMY LAPAROSCOPIC converted to open appendectomy;  Surgeon: Vicenta Poli, MD;  Location: [facility] OR;  Service: General;  Laterality: N/A;   Family History  Problem Relation Age of Onset   Diabetes Neg Hx    Hypertension Neg Hx    Colon cancer Neg Hx    Pancreatic cancer Neg Hx    Esophageal cancer Neg Hx    Liver cancer Neg Hx    Stomach cancer Neg Hx    Social History   Occupational History   Not on file  Tobacco Use   Smoking status: Every Day    Current packs/day: 0.50    Average packs/day: 0.5 packs/day for 50.0 years (25.0 ttl pk-yrs)    Types: Cigarettes   Smokeless tobacco: Never  Vaping Use   Vaping status: Never Used  Substance and Sexual Activity   Alcohol use: Not Currently    Alcohol/week: 1.0 standard drink of alcohol    Types: 1 Cans of beer per week    Comment: a beer once a week. As of July 2020 reports no alcohol at all   Drug use: No   Sexual activity: Not Currently   Tobacco Counseling Ready to quit: Not Answered Counseling given: Not Answered  SDOH Screenings   Food Insecurity: No Food Insecurity (10/06/2024)  Housing: Low Risk  (10/06/2024)  Transportation Needs: No Transportation Needs (10/06/2024)  Utilities: Not At Risk (10/06/2024)  Alcohol Screen: Low Risk  (03/12/2024)  Depression (PHQ2-9): Low Risk  (10/06/2024)  Financial Resource Strain: Low Risk  (03/12/2024)  Physical Activity: Inactive (10/06/2024)  Social Connections: Socially Isolated (10/06/2024)  Stress: Stress Concern Present  (10/06/2024)  Tobacco Use: High Risk (10/06/2024)  Health Literacy: Adequate Health Literacy (10/06/2024)   Depression Screen    10/06/2024    1:44 PM 03/13/2024    9:37 AM 10/01/2023    1:58 PM 08/28/2023    9:11 AM 07/29/2023   10:01 AM 04/24/2023   10:51 AM 01/24/2023   11:30 AM  PHQ 2/9 Scores  PHQ - 2 Score 1 2 4 4  2  0  PHQ- 9 Score 1 10 17 17  5  0  Exception Documentation     Patient refusal       Goals Addressed   None    Visit info / Clinical Intake: Medicare Wellness Visit Type:: Subsequent Annual Wellness Visit Medicare Wellness Visit Mode:: Telephone If telephone:: video declined If telephone or video:: pt reported vitals Interpreter Needed?: No Pre-visit prep was completed: yes AWV questionnaire completed by patient prior to visit?: no Living arrangements:: (!) lives alone Patient's Overall Health Status Rating: (!) fair Typical amount of pain: some Does pain affect daily life?: (!) yes Are you currently prescribed opioids?: no  Dietary Habits and Nutritional Risks How many meals a day?: 3 Eats fruit and vegetables daily?: yes Most meals are obtained by: eating out; having others provide food Diabetic:: no  Functional Status Activities of Daily Living (to include ambulation/medication): Independent Ambulation: Independent Medication Administration: Independent Home Management: Needs assistance (  comment) Manage your own finances?: (!) no Primary transportation is: family/friends Concerns about vision?: no *vision screening is required for WTM* Concerns about hearing?: no  Fall Screening Falls in the past year?: 0 Number of falls in past year: 0 Was there an injury with Fall?: 0 Fall Risk Category Calculator: 0 Patient Fall Risk Level: Low Fall Risk  Fall Risk Patient at Risk for Falls Due to: No Fall Risks Fall risk Follow up: Falls evaluation completed; Education provided  Home and Transportation Safety: All rugs have non-skid backing?: N/A, no  rugs All stairs or steps have railings?: N/A, no stairs Grab bars in the bathtub or shower?: yes Have non-skid surface in bathtub or shower?: yes Good home lighting?: yes Regular seat belt use?: yes Hospital stays in the last year:: no  Cognitive Assessment Difficulty concentrating, remembering, or making decisions? : yes Will 6CIT or Mini Cog be Completed: yes What year is it?: 4 points What month is it?: 0 points Give patient an address phrase to remember (5 components): [address] [place] About what time is it?: 0 points Count backwards from 20 to 1: 0 points Say the months of the year in reverse: 4 points Repeat the address phrase from earlier: 10 points 6 CIT Score: 18 points  Advance Directives (For Healthcare) Does Patient Have a Medical Advance Directive?: Yes Type of Advance Directive: Healthcare Power of Attorney Copy of Healthcare Power of Attorney in Chart?: No - copy requested  Reviewed/Updated  Reviewed/Updated: All        Objective:    Today's Vitals   10/06/24 1325  Weight: 177 lb (80.3 kg)  Height: 5' 8 (1.727 m)   Body mass index is 26.91 kg/m.  Current Medications (verified) Outpatient Encounter Medications as of 10/06/2024  Medication Sig   acetic acid  2 % otic solution Place 3 drops into the right ear 3 (three) times daily.   amLODipine  (NORVASC ) 10 MG tablet Take 1 tablet (10 mg total) by mouth daily.   aspirin  EC 81 MG tablet Take 1 tablet (81 mg total) by mouth daily. Swallow whole.   atorvastatin  (LIPITOR) 20 MG tablet Take 1 tablet (20 mg total) by mouth daily.   donepezil  (ARICEPT ) 10 MG tablet Take 1 tablet (10 mg total) by mouth at bedtime.   losartan  (COZAAR ) 50 MG tablet Take 1 tablet (50 mg total) by mouth daily.   memantine  (NAMENDA ) 10 MG tablet Take 1 tablet (10 mg total) by mouth 2 (two) times daily.   prednisoLONE  acetate (PRED FORTE ) 1 % ophthalmic suspension Apply 2 drops to right ear three times daily   senna-docusate  (SENOKOT-S) 8.6-50 MG tablet Take 2 tablets by mouth daily. For constipation   sildenafil  (VIAGRA ) 100 MG tablet Take 0.5-1 tablets (50-100 mg total) by mouth daily as needed for erectile dysfunction.   spironolactone  (ALDACTONE ) 25 MG tablet Take 1 tablet (25 mg total) by mouth daily.   No facility-administered encounter medications on file as of 10/06/2024.   Hearing/Vision screen Hearing Screening - Comments:: No issues Vision Screening - Comments:: No issues.  Immunizations and Health Maintenance Health Maintenance  Topic Date Due   Colonoscopy  Never done   Zoster Vaccines- Shingrix (1 of 2) Never done   Influenza Vaccine  07/03/2024   COVID-19 Vaccine (4 - 2025-26 season) 08/03/2024   Medicare Annual Wellness (AWV)  09/30/2024   Lung Cancer Screening  03/27/2025   Pneumococcal Vaccine: 50+ Years  Completed   Hepatitis C Screening  Completed  Meningococcal B Vaccine  Aged Out   DTaP/Tdap/Td  Discontinued        Assessment/Plan:  This is a routine wellness examination for James Riley.  Patient Care Team: Theotis Haze ORN, NP as PCP - General (Nurse Practitioner) Rosemarie Eather RAMAN, MD as Consulting Physician (Neurology)  I have personally reviewed and noted the following in the patient's chart:   Medical and social history Use of alcohol, tobacco or illicit drugs  Current medications and supplements including opioid prescriptions. Functional ability and status Nutritional status Physical activity Advanced directives List of other physicians Hospitalizations, surgeries, and ER visits in previous 12 months Vitals Screenings to include cognitive, depression, and falls Referrals and appointments  No orders of the defined types were placed in this encounter.  In addition, I have reviewed and discussed with patient certain preventive protocols, quality metrics, and best practice recommendations. A written personalized care plan for preventive services as well as general  preventive health recommendations were provided to patient.   James Riley, CMA   10/06/2024   No follow-ups on file.  After Visit Summary: (MyChart) Due to this being a telephonic visit, the after visit summary with patients personalized plan was offered to patient via MyChart   Nurse Notes: Patient is having back issues but is seeing the provider for this now. Other than that he is doing well.

## 2024-10-06 NOTE — Patient Instructions (Addendum)
 Placed in [facility]  [address]  [place], [place] [address] Ph# [phone]  [facility] [address] [place], [place] [address] PH# [phone]

## 2024-10-06 NOTE — Progress Notes (Signed)
 Assessment & Plan:  Stpehen was seen today for back pain.  Diagnoses and all orders for this visit:  Lumbar radiculopathy -     Ambulatory referral to Physical Medicine Rehab -     meloxicam (MOBIC) 7.5 MG tablet; Take 1 tablet (7.5 mg total) by mouth daily. For back pain Chronic low back pain, severity 7/10, worsened by walking or sitting. Vertebral degeneration noted on x-ray. Temporary pain management needed pending specialist evaluation. - Refer to physical medicine for evaluation and potential steroid injection or other treatment options. - Prescribe temporary pain management.  Primary hypertension Managed with losartan  and spironolactone , which may contribute to constipation.  Chronic constipation -     senna-docusate (SENOKOT-S) 8.6-50 MG tablet; Take 2 tablets by mouth 2 (two) times daily. For constipation -     Ambulatory referral to Gastroenterology Bowel movements once a week. MiraLAX  ineffective due to low fluid intake. Current medications may exacerbate constipation. Linzess not prescribed due to age concerns. - Prescribe Senokot, two tablets in the morning and two in the evening. - Increase fluid intake to 64-80 ounces per day. - Refer to gastroenterology for colonoscopy if Senokot is ineffective.  Need for influenza vaccination -     Flu vaccine HIGH DOSE PF(Fluzone Trivalent)  Prediabetes -     Hemoglobin A1c -     CMP14+EGFR  Colon cancer screening -     Ambulatory referral to Gastroenterology Overdue for colonoscopy. Previous colonoscopy scheduled in 2022 but not completed. Colonoscopy preferred over Cologuard for definitive results. - Refer to gastroenterology for colonoscopy.   Patient has been counseled on age-appropriate routine health concerns for screening and prevention. These are reviewed and up-to-date. Referrals have been placed accordingly. Immunizations are up-to-date or declined.    Subjective:   Chief Complaint  Patient presents with   Back  Pain    OTC bayer used for back and leg pain.    History of Present Illness James Riley is a 73 year old male who presents for back pain and evaluation of constipation.  He has a history of HTN, HPL and dementia (mother passed with alzheimer's) He is accompanied by his brother who is his HCPOA    Back Pain: Patient presents for presents evaluation of low back problems.  Symptoms have been present for several years and include pain and stiffness in lower back. Initial inciting event: none. Symptoms are worst: all day. Alleviating factors identifiable by patient are none. Exacerbating factors identifiable by patient are bending backwards, bending forwards, bending sideways, recumbency, sitting, standing, and walking. Treatments so far initiated by patient: tylenol . The pain is rated as 7 out of 10 and is exacerbated by walking or sitting. He has been taking Bayer Back and Body Pain, but these have become ineffective. Previous lower back problems: none. Previous workup: xray.  DG LUMBAR SPINE Normal alignment of the lumbar spine.  Very mild L3-4 disc space narrowing with osteophyte.  Minimal L4-5 disc space narrowing with osteophyte.  L5-S1 mild bilateral facet joint degenerative changes.  No pars defect noted.    HTN Blood pressure is elevated today. He notes medication adherence as his sister fills his pill box for him  BP Readings from Last 3 Encounters:  10/06/24 (!) 142/74  03/13/24 121/69  10/29/23 128/70      He has not attended his neurology appointments since November of last year, resulting in a delay in refilling his memory medications. A 30-day supply has been refilled, but further refills are needed  from neurology.   He experiences chronic constipation, having bowel movements only once a week. He takes a stool softener daily but drinks only about two bottles of water per day, which may be insufficient. He has a history of using MiraLAX  for constipation, but its  effectiveness is uncertain due to low fluid intake.  He has not had a colonoscopy, although he was scheduled for one in 2022, which he missed. He has had an endoscopy in the past.   Review of Systems  Constitutional:  Negative for fever, malaise/fatigue and weight loss.  HENT: Negative.  Negative for nosebleeds.   Eyes: Negative.  Negative for blurred vision, double vision and photophobia.  Respiratory: Negative.  Negative for cough and shortness of breath.   Cardiovascular: Negative.  Negative for chest pain, palpitations and leg swelling.  Gastrointestinal:  Positive for constipation. Negative for heartburn, nausea and vomiting.  Musculoskeletal:  Positive for back pain. Negative for myalgias.  Neurological: Negative.  Negative for dizziness, focal weakness, seizures and headaches.  Psychiatric/Behavioral:  Positive for memory loss. Negative for suicidal ideas.     Past Medical History:  Diagnosis Date   Hypercholesterolemia    Hypertension    Stroke [facility]) ~ 1989   little weak on my right side since (09/10/2016)    Past Surgical History:  Procedure Laterality Date   APPENDECTOMY     LAPAROSCOPIC APPENDECTOMY N/A 09/07/2016   Procedure: APPENDECTOMY LAPAROSCOPIC converted to open appendectomy;  Surgeon: Vicenta Poli, MD;  Location: [facility] OR;  Service: General;  Laterality: N/A;    Family History  Problem Relation Age of Onset   Diabetes Neg Hx    Hypertension Neg Hx    Colon cancer Neg Hx    Pancreatic cancer Neg Hx    Esophageal cancer Neg Hx    Liver cancer Neg Hx    Stomach cancer Neg Hx     Social History Reviewed with no changes to be made today.   Outpatient Medications Prior to Visit  Medication Sig Dispense Refill   acetic acid  2 % otic solution Place 3 drops into the right ear 3 (three) times daily. 15 mL 1   amLODipine  (NORVASC ) 10 MG tablet Take 1 tablet (10 mg total) by mouth daily. 90 tablet 0   aspirin  EC 81 MG tablet Take 1 tablet (81 mg total) by  mouth daily. Swallow whole.     atorvastatin  (LIPITOR) 20 MG tablet Take 1 tablet (20 mg total) by mouth daily. 90 tablet 1   donepezil  (ARICEPT ) 10 MG tablet Take 1 tablet (10 mg total) by mouth at bedtime. 30 tablet 0   losartan  (COZAAR ) 50 MG tablet Take 1 tablet (50 mg total) by mouth daily. 90 tablet 1   memantine  (NAMENDA ) 10 MG tablet Take 1 tablet (10 mg total) by mouth 2 (two) times daily. 60 tablet 0   spironolactone  (ALDACTONE ) 25 MG tablet Take 1 tablet (25 mg total) by mouth daily. 90 tablet 1   senna-docusate (SENOKOT-S) 8.6-50 MG tablet Take 2 tablets by mouth daily. For constipation 200 tablet 3   prednisoLONE  acetate (PRED FORTE ) 1 % ophthalmic suspension Apply 2 drops to right ear three times daily (Patient not taking: Reported on 10/06/2024) 5 mL 0   sildenafil  (VIAGRA ) 100 MG tablet Take 0.5-1 tablets (50-100 mg total) by mouth daily as needed for erectile dysfunction. (Patient not taking: Reported on 10/06/2024) 10 tablet 11   No facility-administered medications prior to visit.    No Known Allergies  Objective:    BP (!) 142/74 (BP Location: Left Arm, Patient Position: Sitting, Cuff Size: Large)   Pulse 71   Resp 19   Ht 5' 11 (1.803 m)   Wt 176 lb 9.6 oz (80.1 kg)   SpO2 99%   BMI 24.63 kg/m  Wt Readings from Last 3 Encounters:  10/06/24 176 lb 9.6 oz (80.1 kg)  10/06/24 177 lb (80.3 kg)  03/13/24 177 lb (80.3 kg)    Physical Exam Vitals and nursing note reviewed.  Constitutional:      Appearance: He is well-developed.  HENT:     Head: Normocephalic and atraumatic.  Cardiovascular:     Rate and Rhythm: Normal rate and regular rhythm.     Heart sounds: Normal heart sounds. No murmur heard.    No friction rub. No gallop.  Pulmonary:     Effort: Pulmonary effort is normal. No tachypnea or respiratory distress.     Breath sounds: Normal breath sounds. No decreased breath sounds, wheezing, rhonchi or rales.  Chest:     Chest wall: No tenderness.   Musculoskeletal:        General: Normal range of motion.     Cervical back: Normal range of motion.  Skin:    General: Skin is warm and dry.  Neurological:     Mental Status: He is alert and oriented to person, place, and time.     Coordination: Coordination normal.  Psychiatric:        Behavior: Behavior normal. Behavior is cooperative.        Thought Content: Thought content normal.        Judgment: Judgment normal.          Patient has been counseled extensively about nutrition and exercise as well as the importance of adherence with medications and regular follow-up. The patient was given clear instructions to go to ER or return to medical center if symptoms don't improve, worsen or new problems develop. The patient verbalized understanding.   Follow-up: Return in about 3 months (around 01/08/2025).   Haze LELON Servant, FNP-BC [facility] [place], [place] [phone]   10/06/2024, 3:27 PM

## 2024-10-07 ENCOUNTER — Ambulatory Visit: Payer: Self-pay | Admitting: Nurse Practitioner

## 2024-10-07 LAB — HEMOGLOBIN A1C
Est. average glucose Bld gHb Est-mCnc: 114 mg/dL
Hgb A1c MFr Bld: 5.6 % (ref 4.8–5.6)

## 2024-10-07 LAB — CMP14+EGFR
ALT: 20 IU/L (ref 0–44)
AST: 25 IU/L (ref 0–40)
Albumin: 4.8 g/dL (ref 3.8–4.8)
Alkaline Phosphatase: 103 IU/L (ref 47–123)
BUN/Creatinine Ratio: 21 (ref 10–24)
BUN: 27 mg/dL (ref 8–27)
Bilirubin Total: 0.3 mg/dL (ref 0.0–1.2)
CO2: 21 mmol/L (ref 20–29)
Calcium: 10 mg/dL (ref 8.6–10.2)
Chloride: 104 mmol/L (ref 96–106)
Creatinine, Ser: 1.27 mg/dL (ref 0.76–1.27)
Globulin, Total: 3.2 g/dL (ref 1.5–4.5)
Glucose: 98 mg/dL (ref 70–99)
Potassium: 3.8 mmol/L (ref 3.5–5.2)
Sodium: 141 mmol/L (ref 134–144)
Total Protein: 8 g/dL (ref 6.0–8.5)
eGFR: 59 mL/min/1.73 — ABNORMAL LOW (ref >59)

## 2024-10-07 NOTE — Progress Notes (Signed)
 I connected with  James Riley on 10/07/24 by a audio enabled telemedicine application and verified that I am speaking with the correct person using two identifiers.  Patient Location: Home  Provider Location: Home Office  I discussed the limitations of evaluation and management by telemedicine. The patient expressed understanding and agreed to proceed.

## 2024-10-12 ENCOUNTER — Other Ambulatory Visit: Payer: Self-pay

## 2024-10-13 ENCOUNTER — Telehealth: Payer: Self-pay | Admitting: Nurse Practitioner

## 2024-10-13 NOTE — Telephone Encounter (Signed)
 Patient Power of Attorney Scanned in under media.

## 2024-11-04 ENCOUNTER — Other Ambulatory Visit: Payer: Self-pay

## 2024-11-04 ENCOUNTER — Other Ambulatory Visit: Payer: Self-pay | Admitting: Nurse Practitioner

## 2024-11-04 ENCOUNTER — Telehealth: Payer: Self-pay | Admitting: Neurology

## 2024-11-04 DIAGNOSIS — M5416 Radiculopathy, lumbar region: Secondary | ICD-10-CM

## 2024-11-04 DIAGNOSIS — F03B0 Unspecified dementia, moderate, without behavioral disturbance, psychotic disturbance, mood disturbance, and anxiety: Secondary | ICD-10-CM

## 2024-11-04 MED ORDER — MEMANTINE HCL 10 MG PO TABS
10.0000 mg | ORAL_TABLET | Freq: Two times a day (BID) | ORAL | 2 refills | Status: AC
  Filled 2024-11-04 – 2024-11-05 (×2): qty 60, 30d supply, fill #0
  Filled 2024-12-04: qty 60, 30d supply, fill #1
  Filled 2025-01-05: qty 60, 30d supply, fill #2

## 2024-11-04 MED ORDER — MELOXICAM 7.5 MG PO TABS
7.5000 mg | ORAL_TABLET | Freq: Every day | ORAL | 2 refills | Status: AC
  Filled 2024-11-04: qty 30, 30d supply, fill #0
  Filled 2024-12-01: qty 30, 30d supply, fill #1
  Filled 2024-12-31: qty 30, 30d supply, fill #2

## 2024-11-04 MED ORDER — DONEPEZIL HCL 10 MG PO TABS
10.0000 mg | ORAL_TABLET | Freq: Every day | ORAL | 2 refills | Status: AC
  Filled 2024-11-04 – 2024-11-05 (×2): qty 30, 30d supply, fill #0
  Filled 2024-12-04: qty 30, 30d supply, fill #1
  Filled 2025-01-05: qty 30, 30d supply, fill #2

## 2024-11-04 NOTE — Telephone Encounter (Signed)
 Last seen on 10/29/23 Follow up scheduled on 09/02/25

## 2024-11-04 NOTE — Telephone Encounter (Signed)
 Pt has been scheduled for his needed 1 yr f/u, he is also on wait list. Pt(his brother for pt) is requesting a refill for memantine  (NAMENDA ) 10 MG tablet & donepezil  (ARICEPT ) 10 MG tablet  .  Pharmacy:  [facility]

## 2024-11-06 ENCOUNTER — Other Ambulatory Visit: Payer: Self-pay

## 2024-12-02 ENCOUNTER — Other Ambulatory Visit: Payer: Self-pay

## 2024-12-04 ENCOUNTER — Other Ambulatory Visit: Payer: Self-pay

## 2024-12-17 ENCOUNTER — Other Ambulatory Visit: Payer: Self-pay

## 2024-12-17 ENCOUNTER — Other Ambulatory Visit: Payer: Self-pay | Admitting: Family Medicine

## 2024-12-17 DIAGNOSIS — I10 Essential (primary) hypertension: Secondary | ICD-10-CM

## 2024-12-17 MED ORDER — AMLODIPINE BESYLATE 10 MG PO TABS
10.0000 mg | ORAL_TABLET | Freq: Every day | ORAL | 0 refills | Status: AC
  Filled 2024-12-17: qty 90, 90d supply, fill #0

## 2024-12-21 ENCOUNTER — Other Ambulatory Visit: Payer: Self-pay

## 2024-12-22 ENCOUNTER — Other Ambulatory Visit: Payer: Self-pay

## 2025-01-01 ENCOUNTER — Other Ambulatory Visit: Payer: Self-pay

## 2025-01-05 ENCOUNTER — Other Ambulatory Visit: Payer: Self-pay

## 2025-01-05 ENCOUNTER — Other Ambulatory Visit: Payer: Self-pay | Admitting: Nurse Practitioner

## 2025-01-05 DIAGNOSIS — K5909 Other constipation: Secondary | ICD-10-CM

## 2025-01-05 MED ORDER — SENNOSIDES-DOCUSATE SODIUM 8.6-50 MG PO TABS
2.0000 | ORAL_TABLET | Freq: Two times a day (BID) | ORAL | 1 refills | Status: AC
  Filled 2025-01-05: qty 120, 30d supply, fill #0

## 2025-01-06 ENCOUNTER — Other Ambulatory Visit: Payer: Self-pay

## 2025-01-11 ENCOUNTER — Ambulatory Visit: Admitting: Nurse Practitioner

## 2025-09-02 ENCOUNTER — Ambulatory Visit: Admitting: Neurology

## 2025-10-19 ENCOUNTER — Ambulatory Visit
